# Patient Record
Sex: Female | Born: 1980 | Race: Black or African American | Hispanic: No | Marital: Single | Smoking: Never smoker
Health system: Southern US, Community
[De-identification: ages and names within clinical notes are randomized; demographics above are authoritative.]

## PROBLEM LIST (undated history)

## (undated) DIAGNOSIS — F32A Depression, unspecified: Secondary | ICD-10-CM

## (undated) DIAGNOSIS — F419 Anxiety disorder, unspecified: Secondary | ICD-10-CM

## (undated) DIAGNOSIS — F329 Major depressive disorder, single episode, unspecified: Secondary | ICD-10-CM

## (undated) DIAGNOSIS — I10 Essential (primary) hypertension: Secondary | ICD-10-CM

## (undated) DIAGNOSIS — T7840XA Allergy, unspecified, initial encounter: Secondary | ICD-10-CM

## (undated) HISTORY — DX: Major depressive disorder, single episode, unspecified: F32.9

## (undated) HISTORY — DX: Essential (primary) hypertension: I10

## (undated) HISTORY — DX: Depression, unspecified: F32.A

## (undated) HISTORY — DX: Anxiety disorder, unspecified: F41.9

## (undated) HISTORY — PX: CHOLECYSTECTOMY: SHX55

## (undated) HISTORY — DX: Allergy, unspecified, initial encounter: T78.40XA

---
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
## 2006-10-23 ENCOUNTER — Other Ambulatory Visit: Payer: Self-pay

## 2006-10-23 ENCOUNTER — Emergency Department: Payer: Self-pay | Admitting: Emergency Medicine

## 2006-10-23 IMAGING — CR DG CHEST 2V
1 series · 2 of 2 positions shown · non-contrast
Comparison: none

REASON FOR EXAM: Choking sensation in chest, slight wheezing
COMMENTS:

PROCEDURE:     DXR - DXR CHEST PA (OR AP) AND LATERAL  - [DATE]  [DATE]
RESULT:     The lungs are adequately inflated. There is no focal infiltrate.
The heart is not enlarged and the pulmonary vascularity is not engorged. The
mediastinum is normal in width.

[Series 1: view not recorded · 0.17mm/px · 2 of 2 slices shown]
[im 1/2]
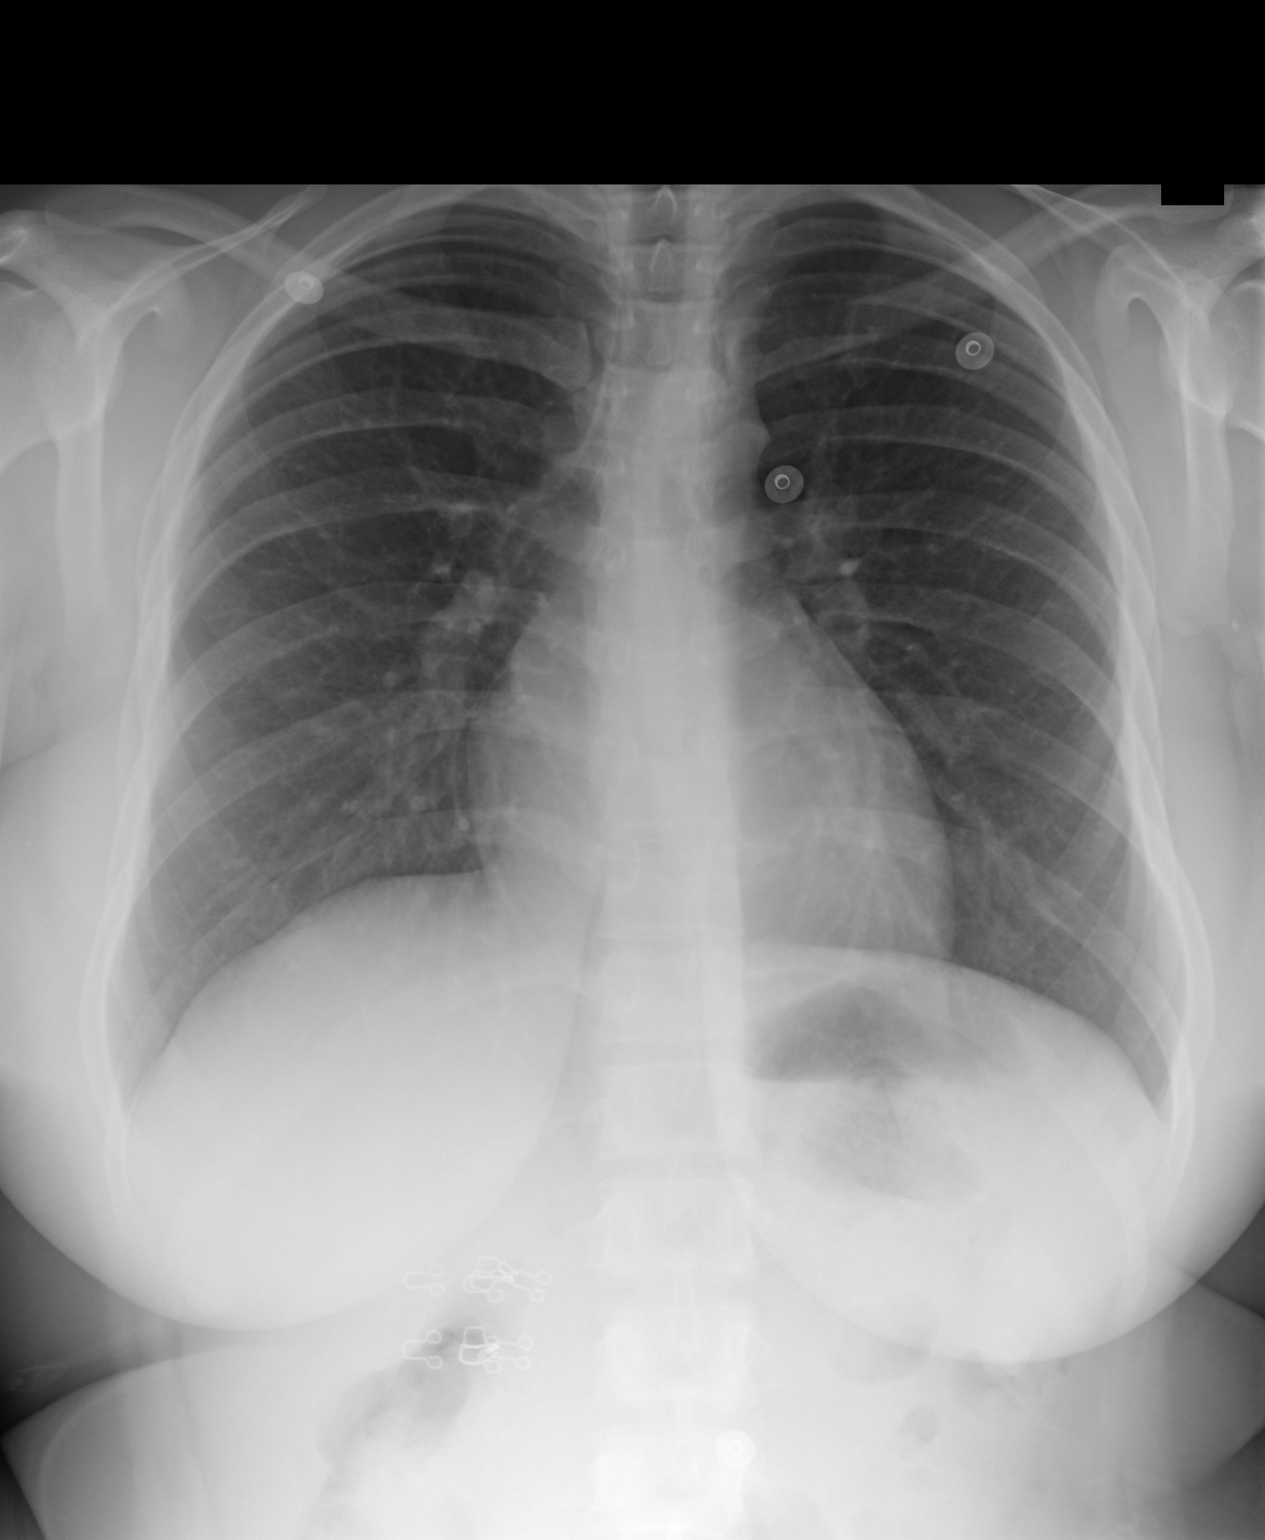
[im 2/2]
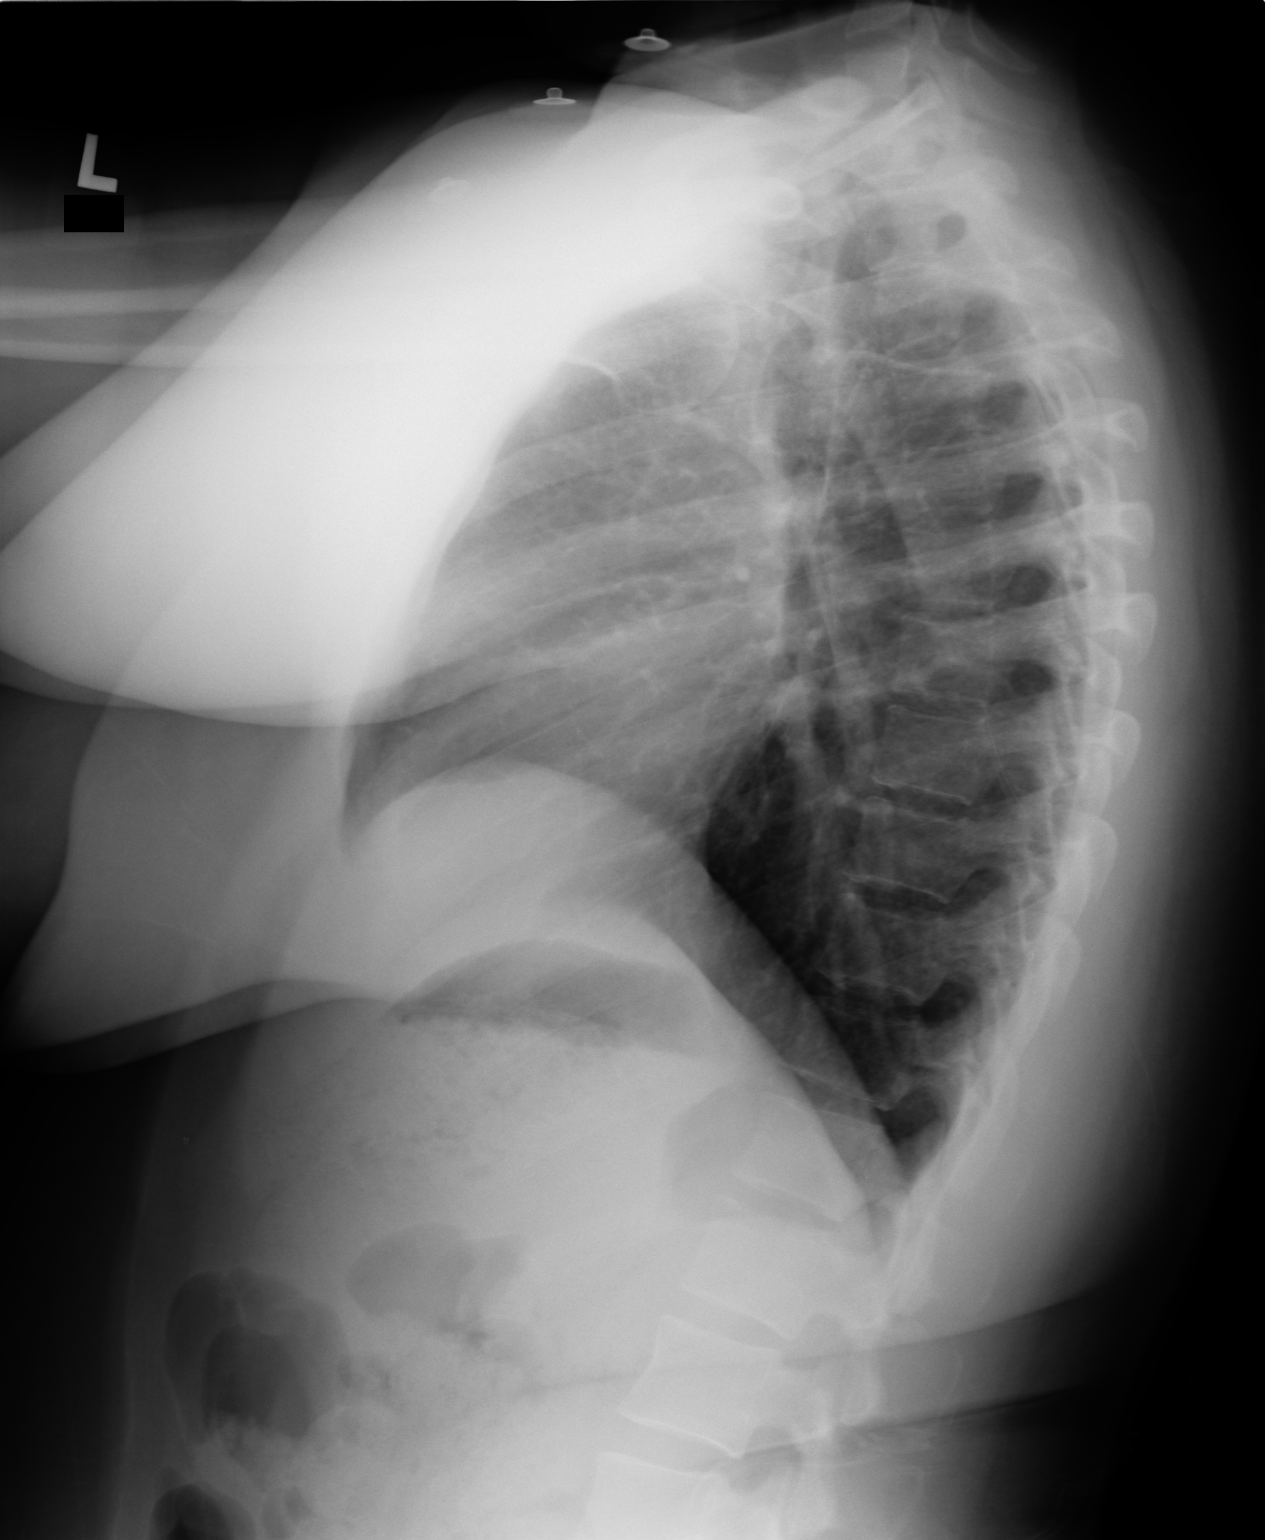

[2 of 2 positions shown; findings below may reference images not displayed]

IMPRESSION: I do not see evidence of acute cardiopulmonary disease.

## 2007-05-12 ENCOUNTER — Emergency Department: Payer: Self-pay | Admitting: Internal Medicine

## 2008-04-28 ENCOUNTER — Emergency Department: Payer: Self-pay | Admitting: Internal Medicine

## 2008-04-30 ENCOUNTER — Emergency Department: Payer: Self-pay | Admitting: Emergency Medicine

## 2008-05-01 IMAGING — CR DG CHEST 2V
1 series · 2 of 2 positions shown · non-contrast
Comparison: none

REASON FOR EXAM: cough / sorethroat   MC 2
COMMENTS:   LMP: Now

PROCEDURE:     DXR - DXR CHEST PA (OR AP) AND LATERAL  - [DATE] [DATE]
RESULT:     Comparison is made to study [DATE].
The lungs are well expanded. There is no focal infiltrate. The heart is
normal in normal in size. The pulmonary vascularity is not engorged. There
is no pleural effusion.

[Series 1: view not recorded · 0.17mm/px · 2 of 2 slices shown]
[im 1/2]
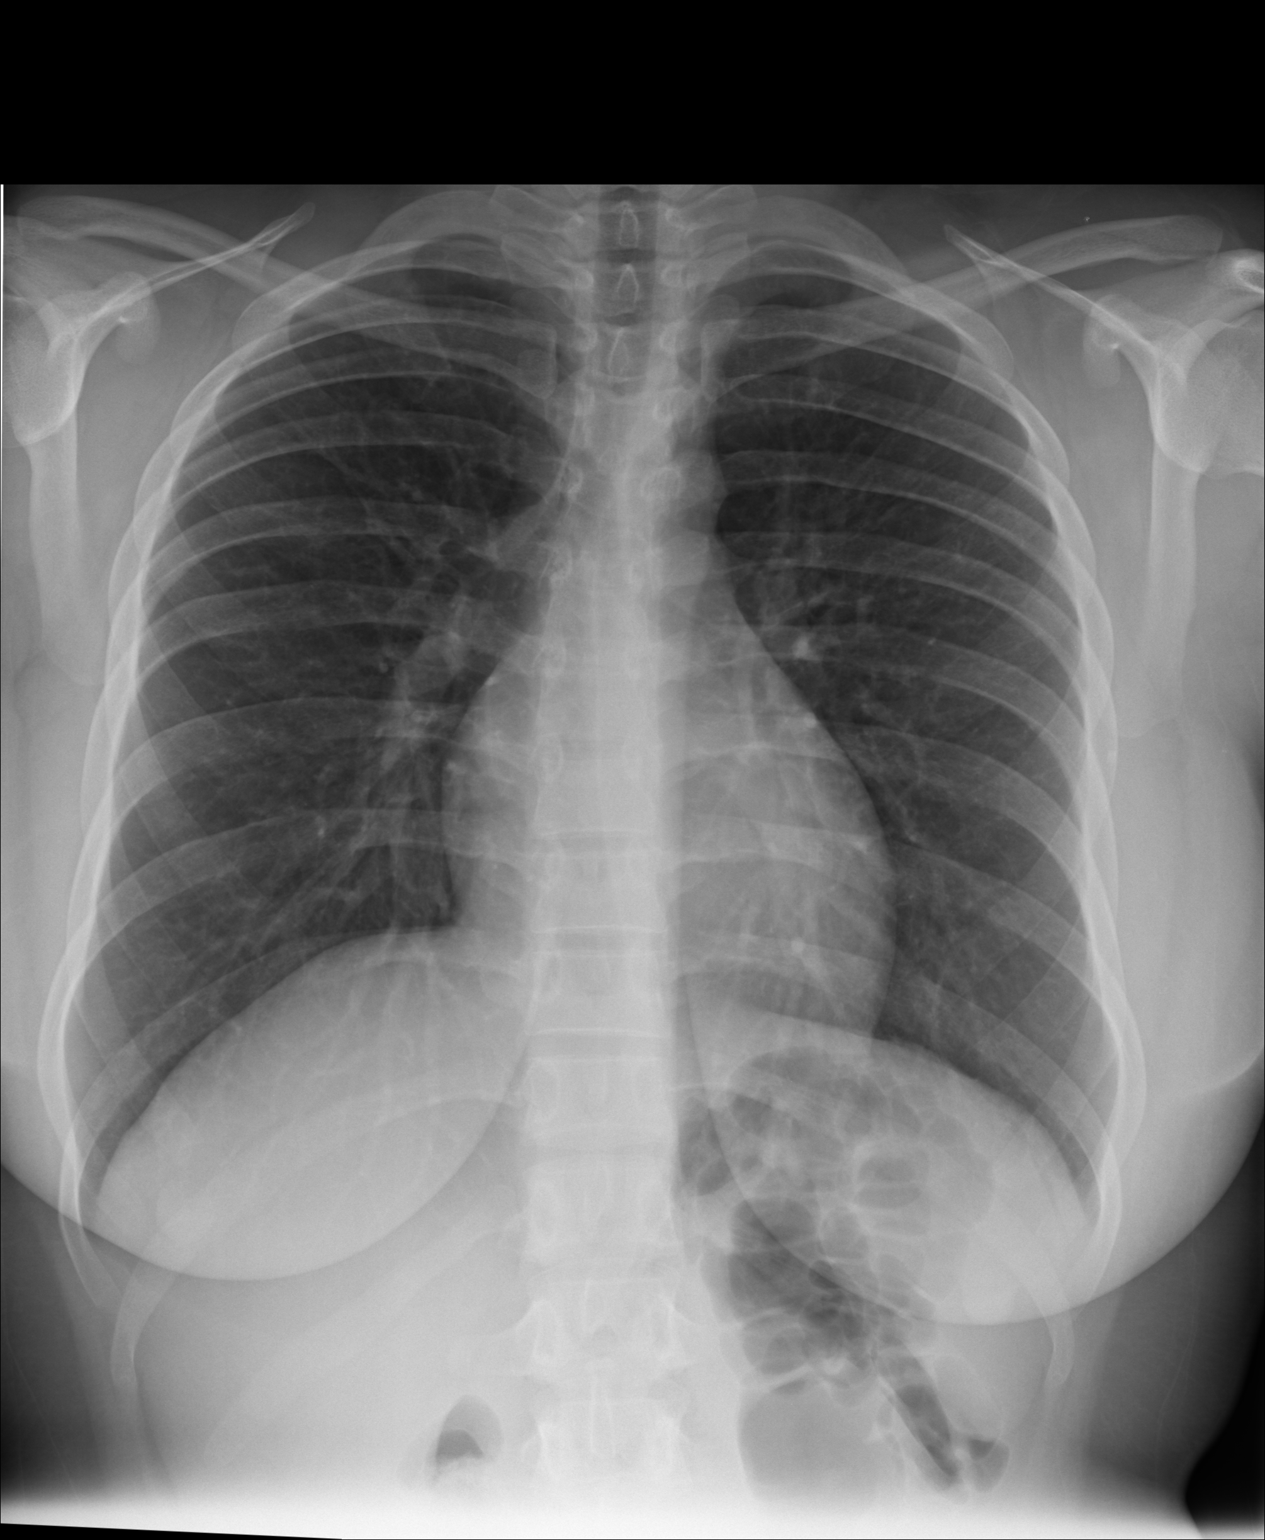
[im 2/2]
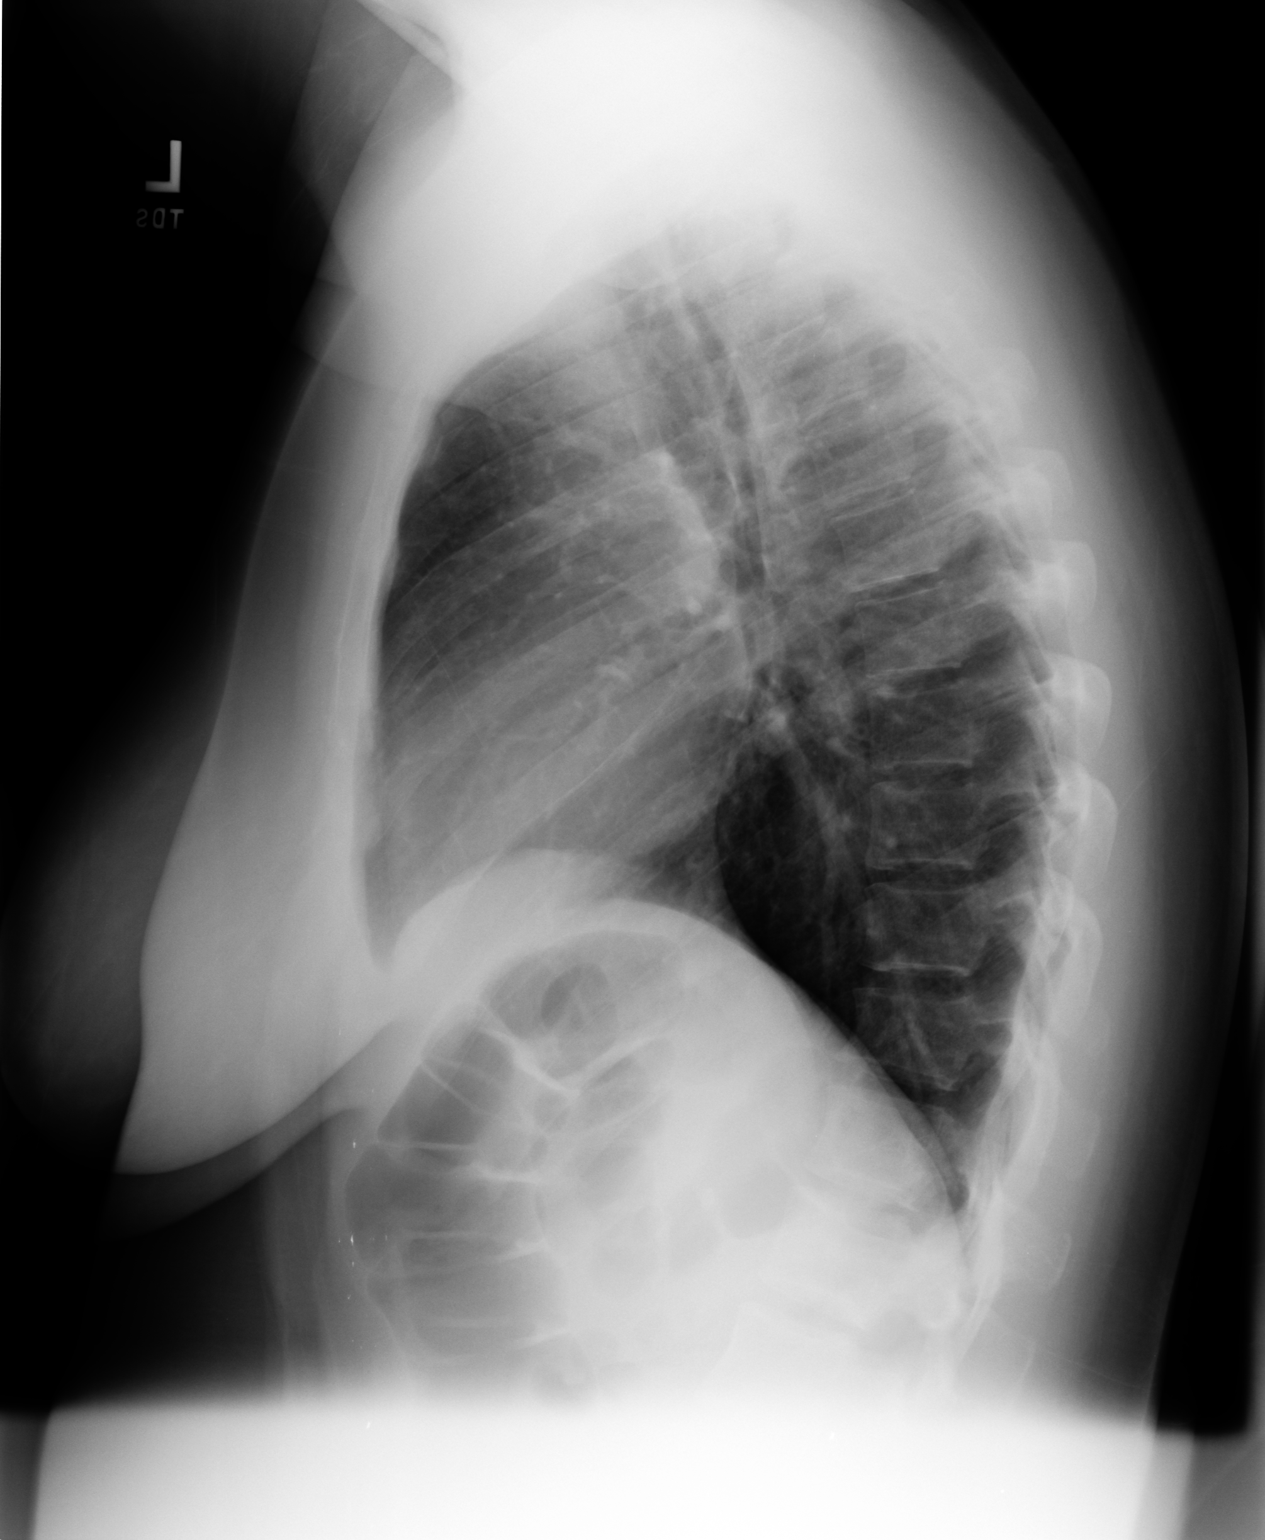

[2 of 2 positions shown; findings below may reference images not displayed]

IMPRESSION: I do not see evidence of acute cardiopulmonary abnormality.

## 2008-05-19 ENCOUNTER — Emergency Department: Payer: Self-pay | Admitting: Emergency Medicine

## 2009-02-13 ENCOUNTER — Emergency Department: Payer: Self-pay | Admitting: Emergency Medicine

## 2009-02-13 IMAGING — CT CT CERVICAL SPINE WITHOUT CONTRAST
1 series · 12 of 14 positions shown, 15 images · non-contrast
Comparison: none

REASON FOR EXAM: neck pain
COMMENTS:

[Series 6: axial · axial · 0.33mm/px · z∈[+812,+939]mm · 12 of 78 slices shown, 15 images]
[im 6/78  soft-tissue]
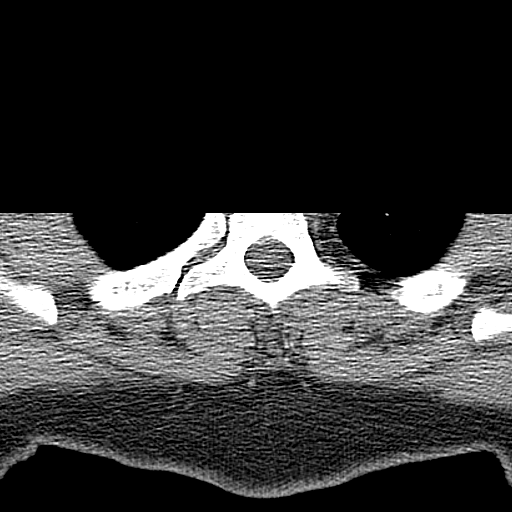
[im 6/78  bone]
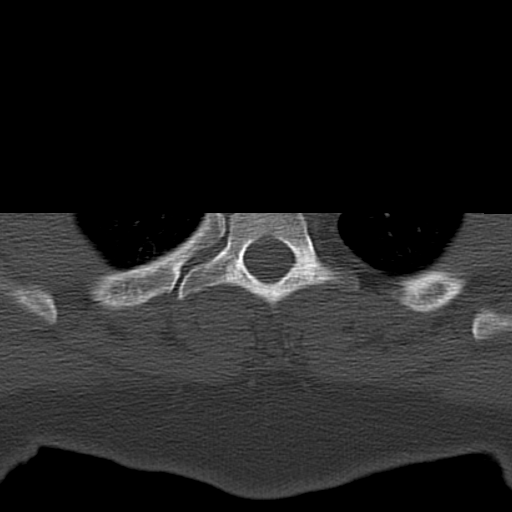
[im 12/78  bone]
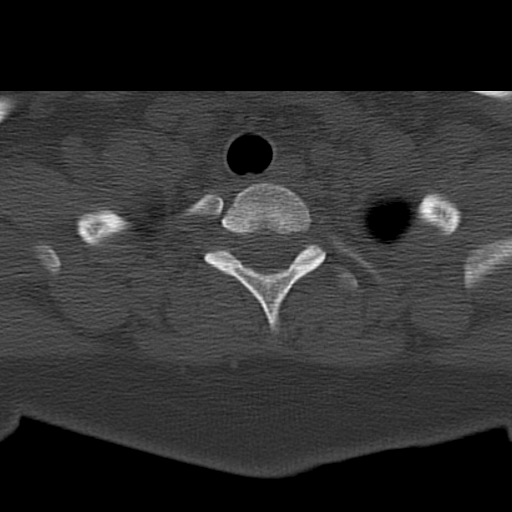
[im 18/78  bone]
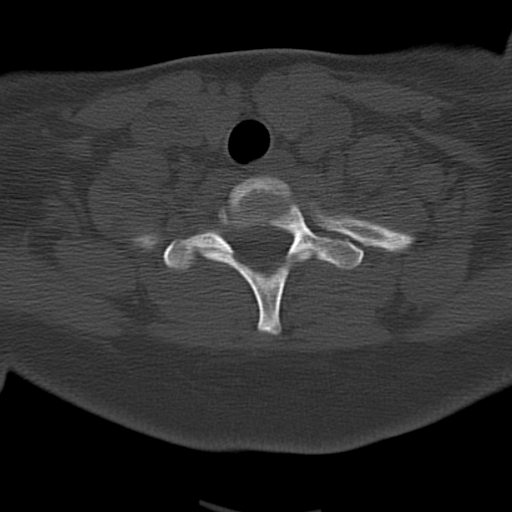
[im 24/78  bone]
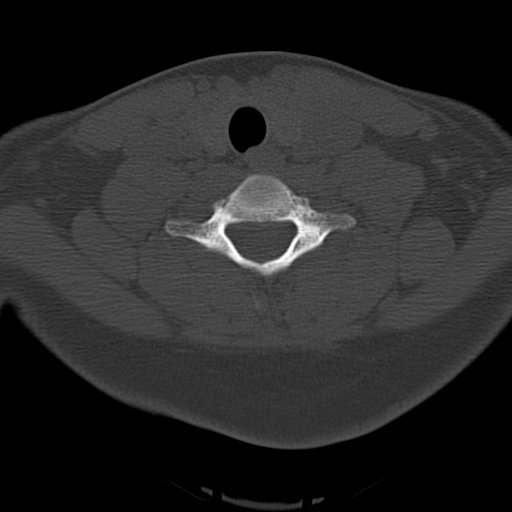
[im 30/78  soft-tissue]
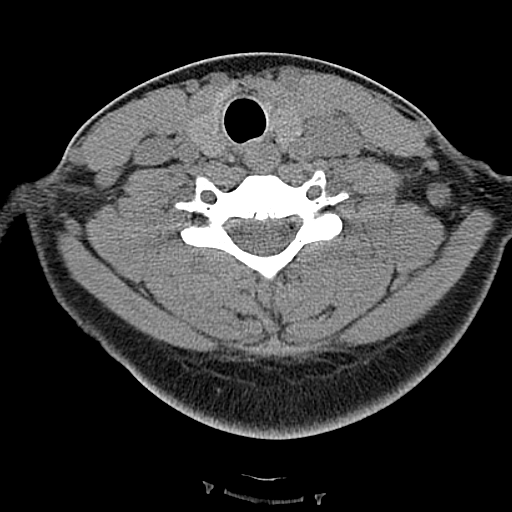
[im 30/78  bone]
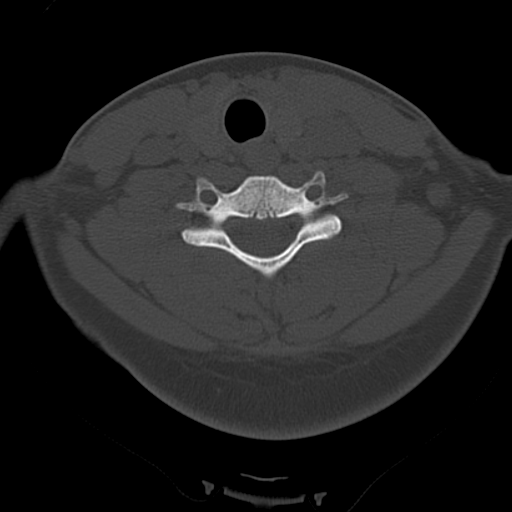
[im 36/78  bone]
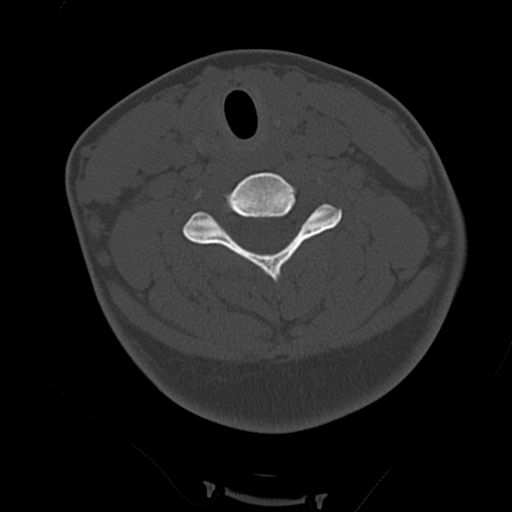
[im 42/78  bone]
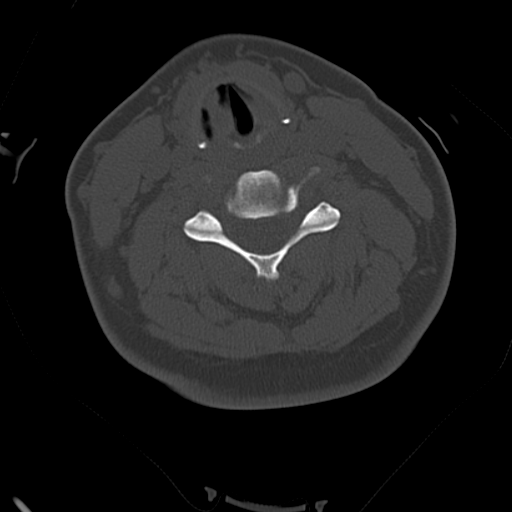
[im 48/78  bone]
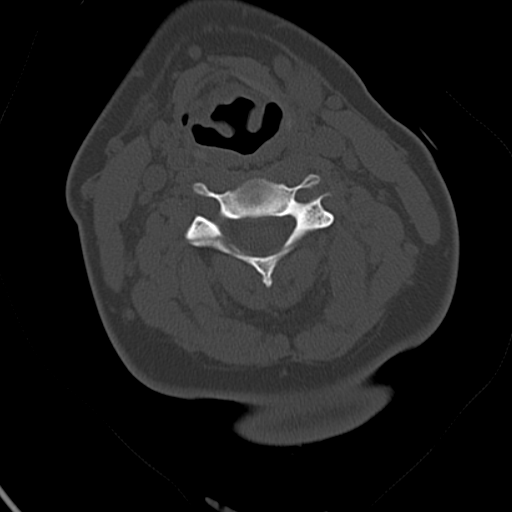
[im 54/78  soft-tissue]
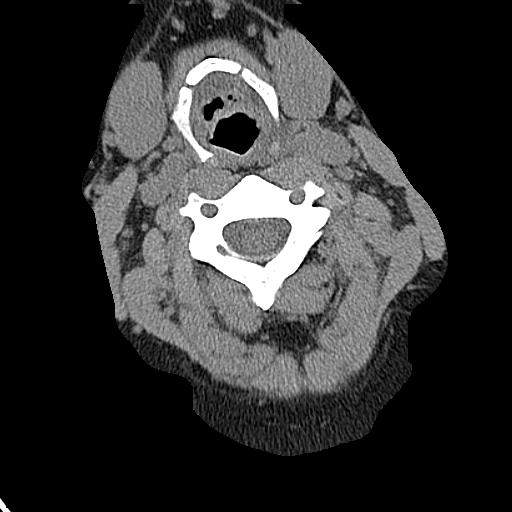
[im 54/78  bone]
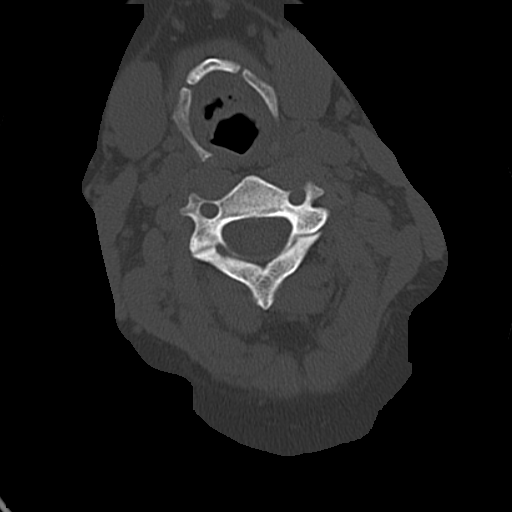
[im 60/78  bone]
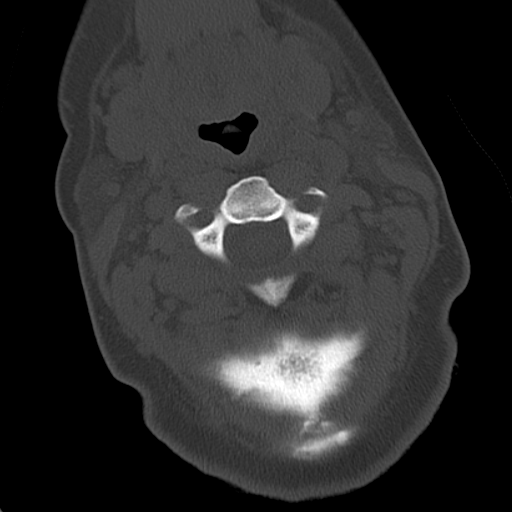
[im 66/78  bone]
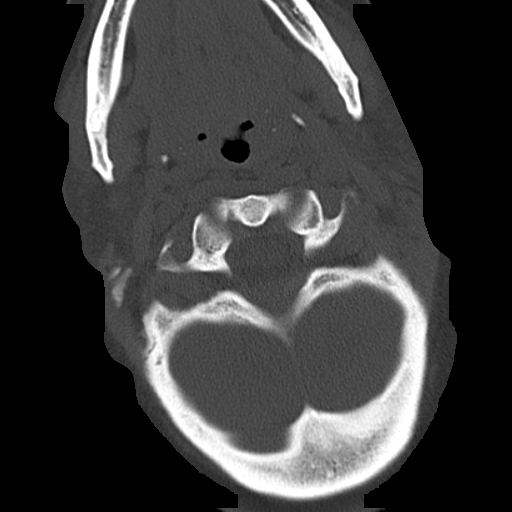
[im 72/78  bone]
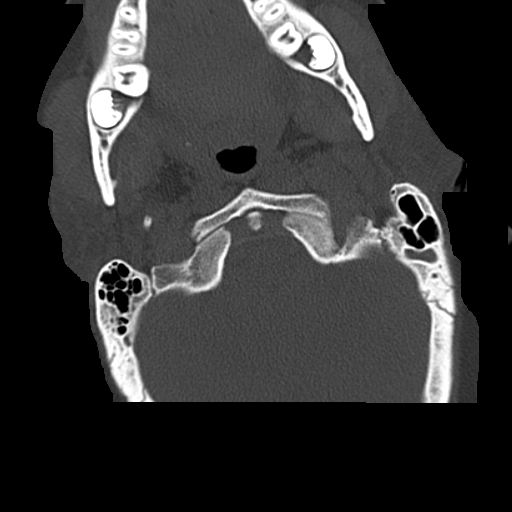

[12 of 14 positions shown; findings below may reference images not displayed]

PROCEDURE:     CT  - CT CERVICAL SPINE WO  - [DATE]  [DATE]

RESULT:     Sagittal, axial, and coronal images were obtained through the
cervical spine area

The cervical vertebral bodies are preserved in height. The intervertebral
disc space heights are well maintained. The prevertebral soft tissue planes
appear normal. Fullness in the prevertebral soft tissues posterior to the
airway is noted at the C2 and C3 levels but this is likely physiologic.
Review of the images at soft tissue window settings do not reveal disruption
of the fat planes and no soft tissue gas or fluid is identified here.

 The odontoid is intact. The lateral masses of C1 align normally with those
of C2 on the coronal images. On the axial images the patient's head is noted
to be slightly turned toward the right with respect to the axis of C1 and
C2. The bony ring at each cervical level is intact.
IMPRESSION: I do not see evidence of an acute cervical spine fracture.
There may be muscle spasm present. Please see the discussion above.

## 2009-09-18 ENCOUNTER — Ambulatory Visit: Payer: Self-pay | Admitting: Adult Health

## 2009-09-18 IMAGING — US US PELV - US TRANSVAGINAL
1 series · 17 of 25 positions shown · non-contrast
Comparison: none

REASON FOR EXAM: HEAVY MENSTRUAL CYCLES
COMMENTS:

[Series 1: us pelv - us transvaginal · 17 of 58 slices shown]
[im 1/58]
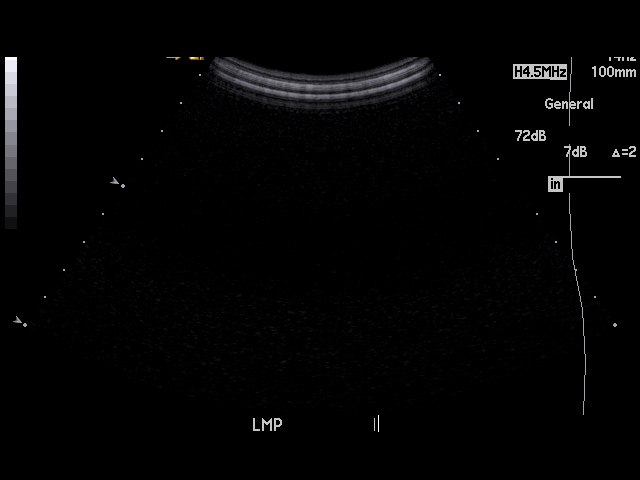
[im 5/58]
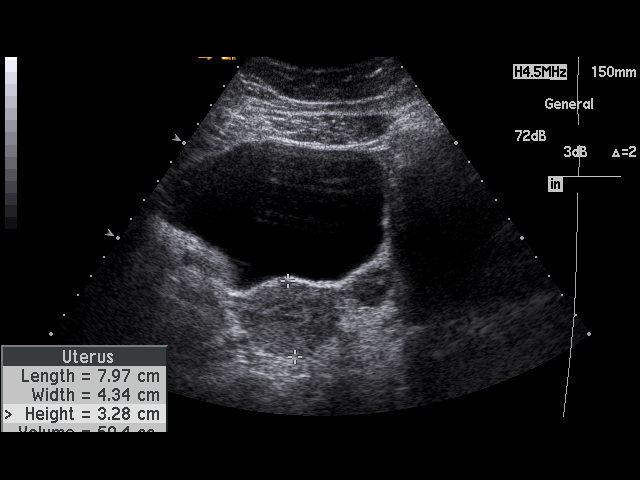
[im 8/58]
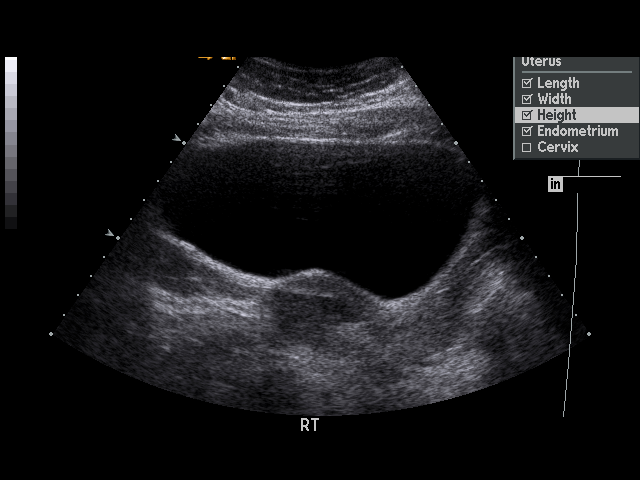
[im 12/58]
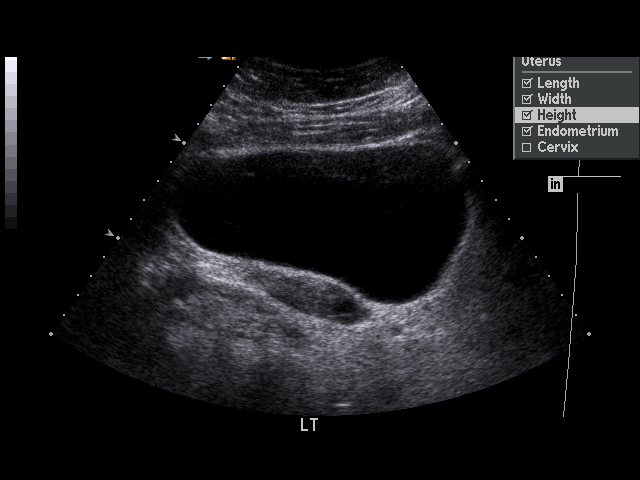
[im 15/58]
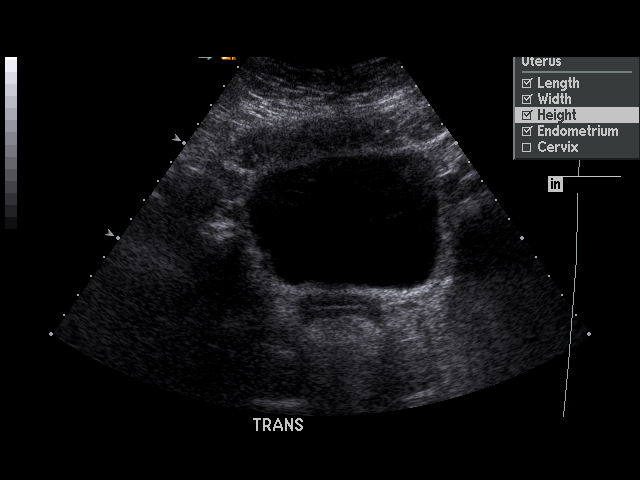
[im 20/58]
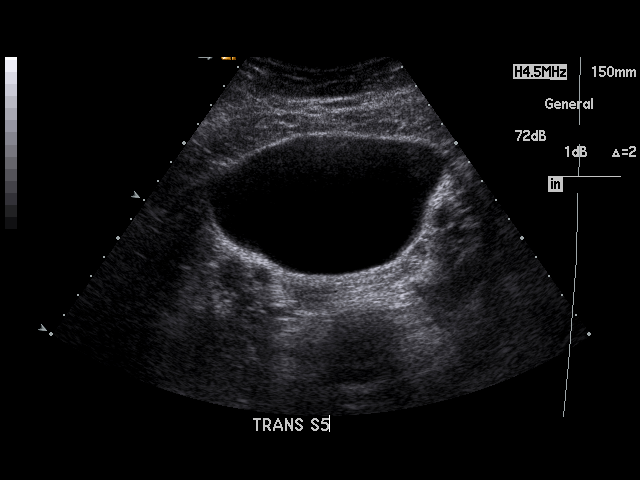
[im 22/58]
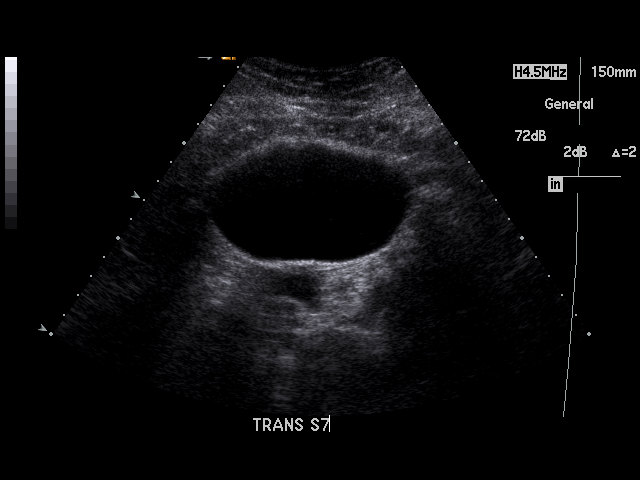
[im 27/58]
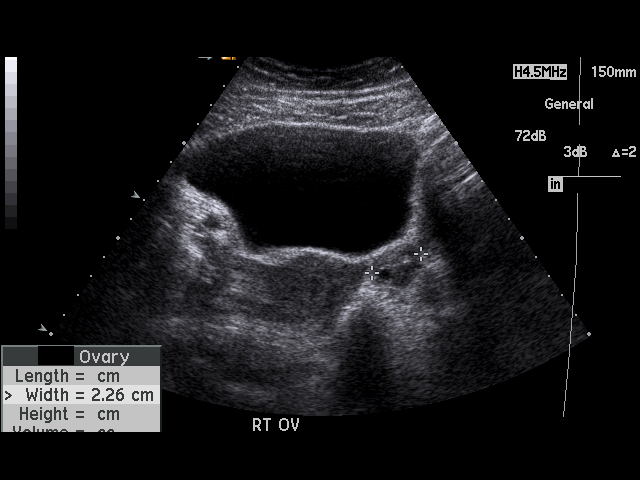
[im 29/58]
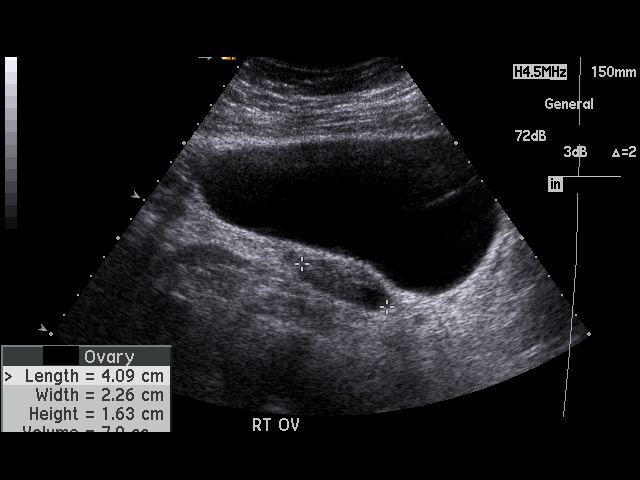
[im 31/58]
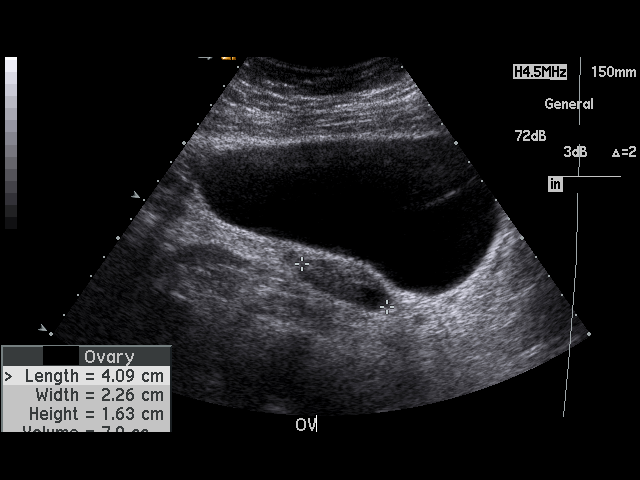
[im 36/58]
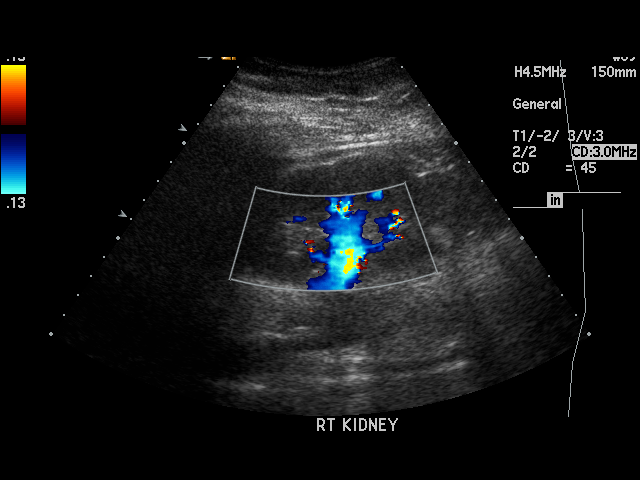
[im 39/58]
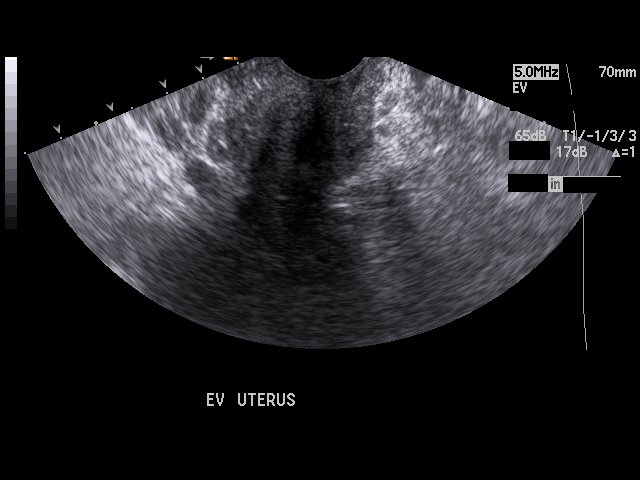
[im 43/58]
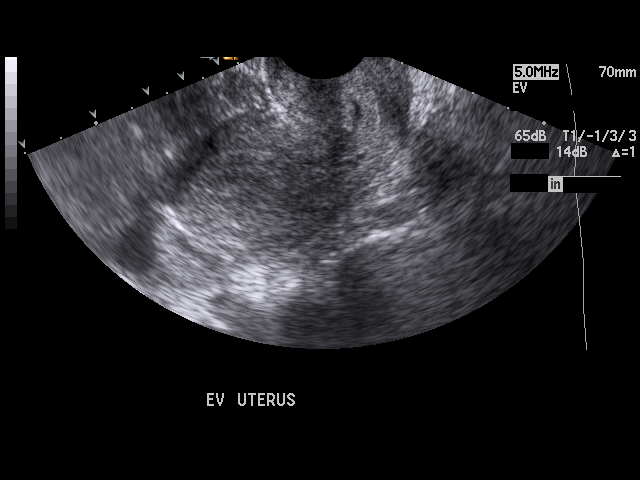
[im 46/58]
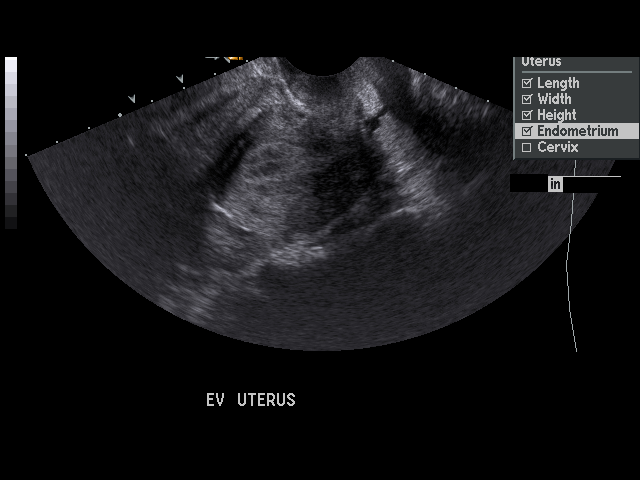
[im 50/58]
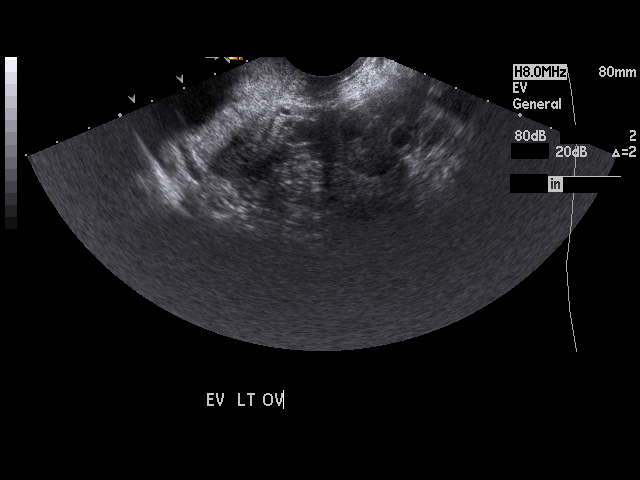
[im 53/58]
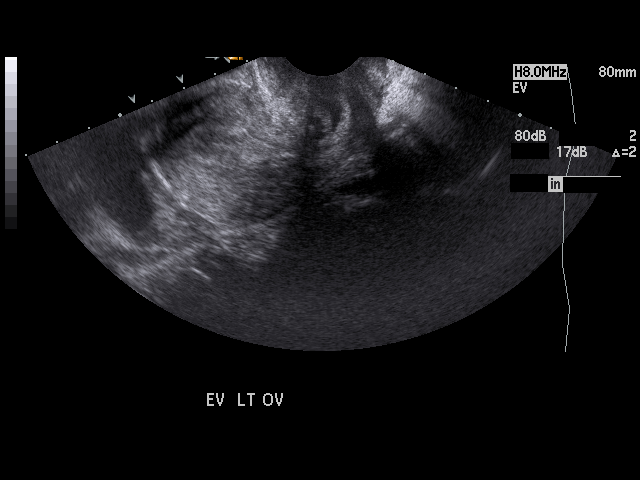
[im 58/58]
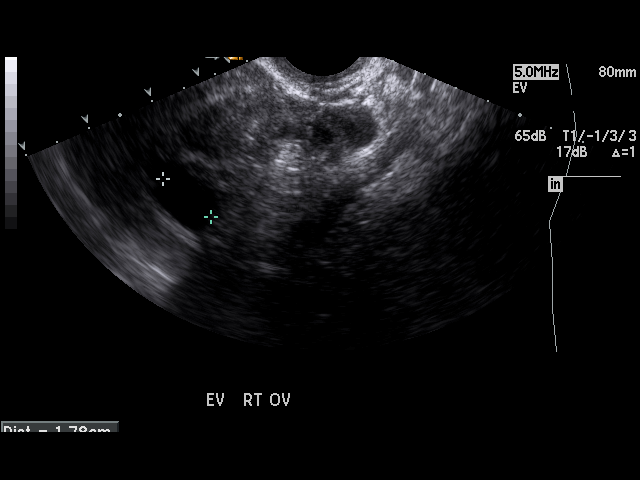

[17 of 25 positions shown; findings below may reference images not displayed]

PROCEDURE:     US  - US PELVIS EXAM W/TRANSVAGINAL  - [DATE]  [DATE]

RESULT:

Transabdominal and endovaginal imaging of the pelvis was obtained.

The uterus measures 7.9 x 4.3 x 3.2 centimeters. Endometrial thickness is
1.1 cm. The uterus demonstrates a homogeneous echotexture. The right ovary
measures 3 x 2.4 x 1.6 cm and the left 4 x 2.2 x 1.6 cm. A right ovarian
cyst is identified measuring 1.4 x 1.34 cm. There is no evidence of pelvic
masses, free fluid nor loculated fluid collections.
IMPRESSION: Right ovarian cyst otherwise unremarkable pelvic ultrasound.

## 2010-02-18 ENCOUNTER — Emergency Department: Payer: Self-pay | Admitting: Emergency Medicine

## 2012-02-03 ENCOUNTER — Emergency Department: Payer: Self-pay | Admitting: *Deleted

## 2012-02-03 LAB — URINALYSIS, COMPLETE
Bilirubin,UR: NEGATIVE
Glucose,UR: NEGATIVE mg/dL (ref 0–75)
Ketone: NEGATIVE
Nitrite: NEGATIVE
Ph: 5 (ref 4.5–8.0)
Protein: 30
RBC,UR: 276 /HPF (ref 0–5)
Specific Gravity: 1.026 (ref 1.003–1.030)
Squamous Epithelial: 20
WBC UR: NONE SEEN /HPF (ref 0–5)

## 2012-02-03 LAB — PREGNANCY, URINE: Pregnancy Test, Urine: NEGATIVE m[IU]/mL

## 2012-02-03 IMAGING — CT CT STONE STUDY
1 of 2 series · 16 of 32 positions shown, 20 images · non-contrast
Comparison: none

REASON FOR EXAM: right flank pain with gross hematuria
COMMENTS:   LMP: Two weeks ago

PROCEDURE:     CT  - CT ABDOMEN /PELVIS WO (STONE)  - [DATE] [DATE]
RESULT:     History: Right flank pain.
Comparison Study: No recent.

[Series 2: 3mm soft tissue · axial · 0.76mm/px · z∈[-502,-88]mm · 16 of 152 slices shown, 20 images]
[im 7/152  soft-tissue]
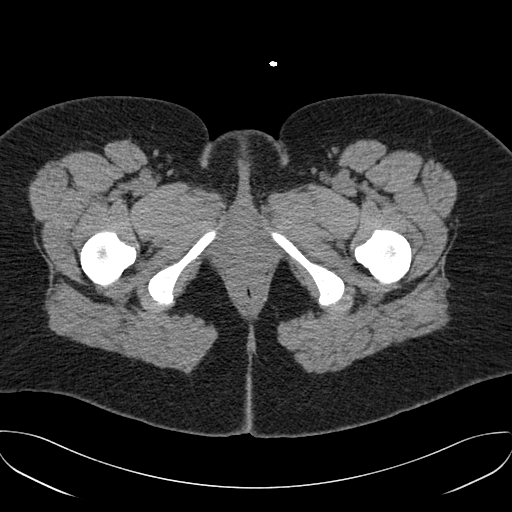
[im 7/152  bone]
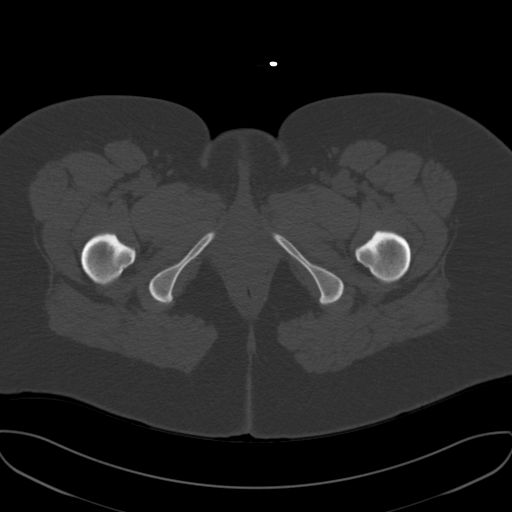
[im 19/152  soft-tissue]
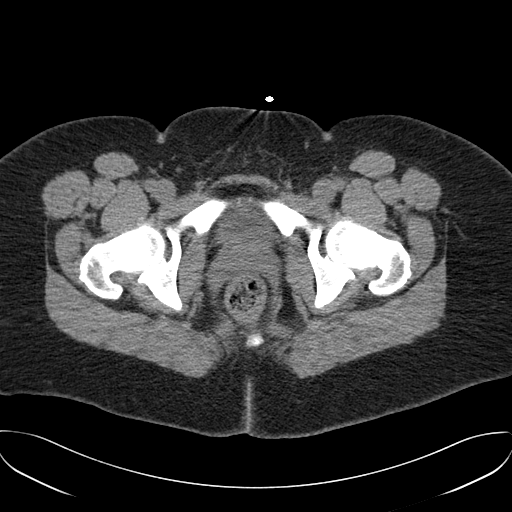
[im 32/152  soft-tissue]
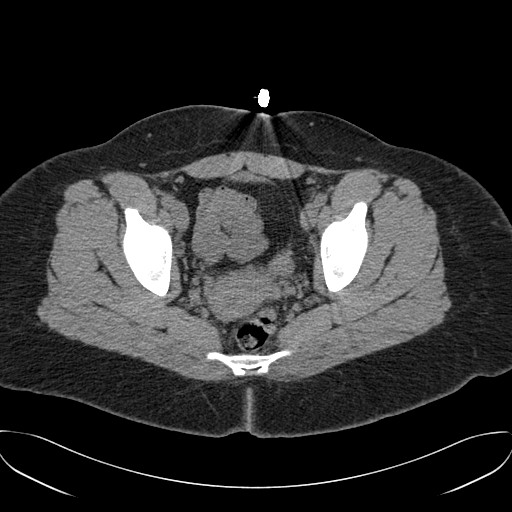
[im 38/152  soft-tissue]
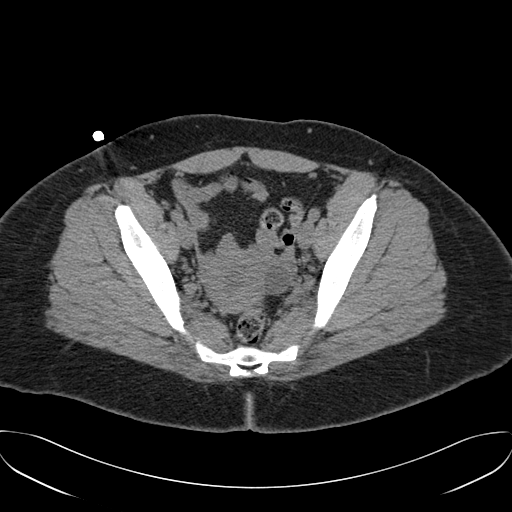
[im 51/152  soft-tissue]
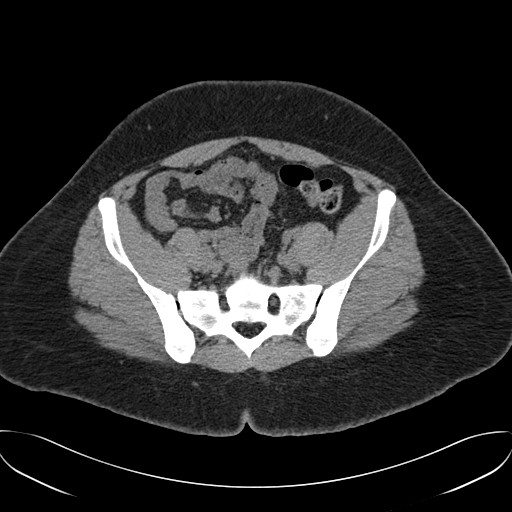
[im 63/152  soft-tissue]
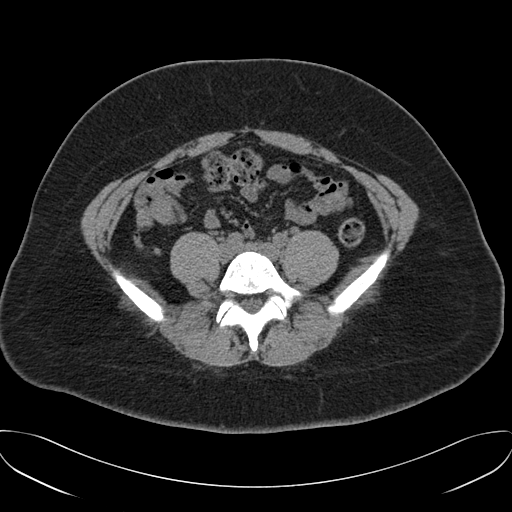
[im 70/152  soft-tissue]
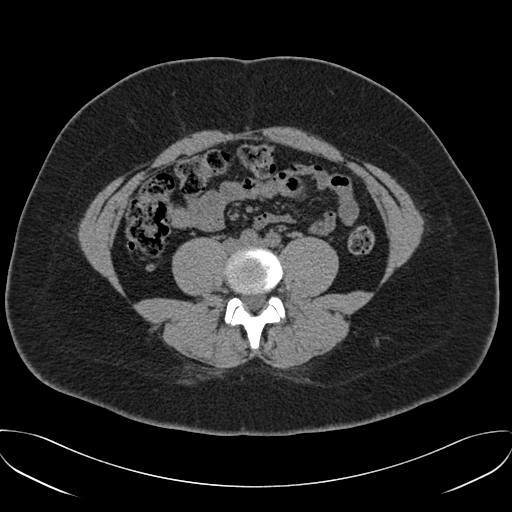
[im 82/152  soft-tissue]
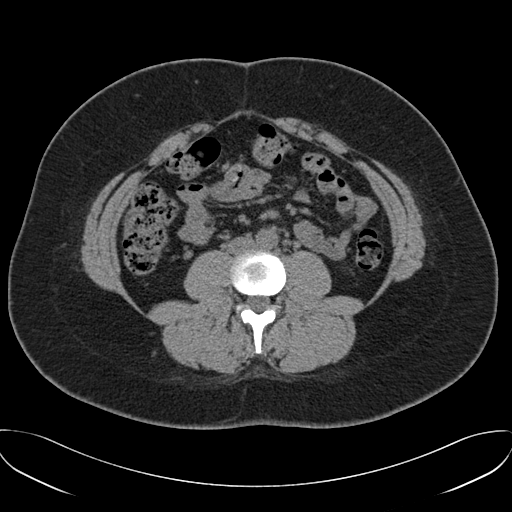
[im 89/152  soft-tissue]
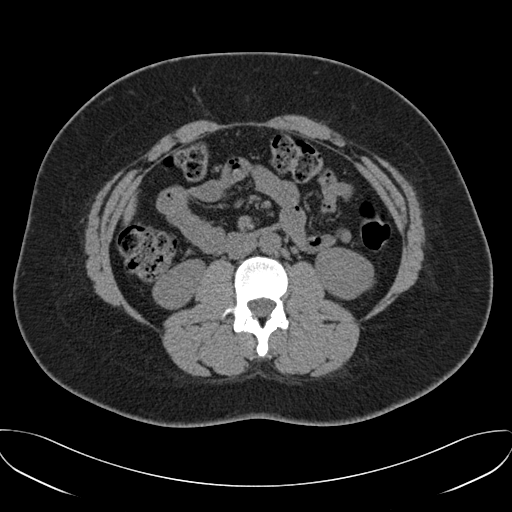
[im 89/152  bone]
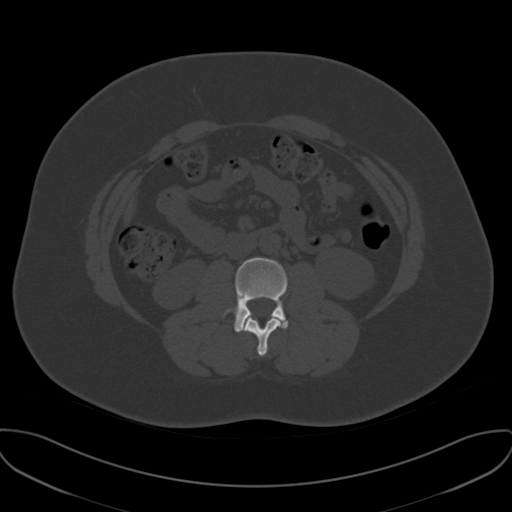
[im 101/152  soft-tissue]
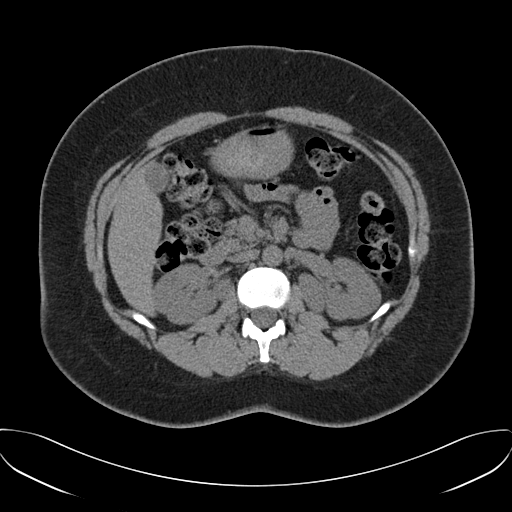
[im 114/152  soft-tissue]
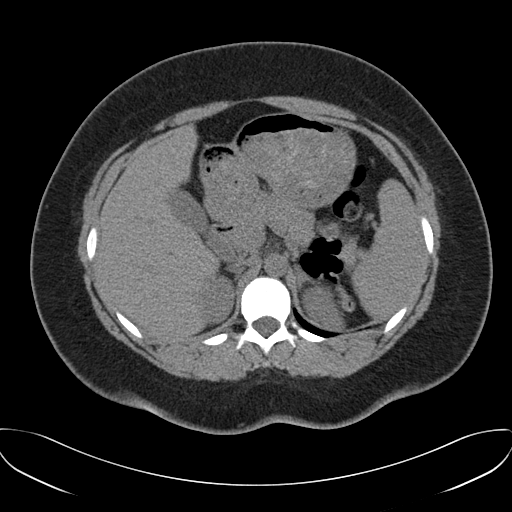
[im 120/152  soft-tissue]
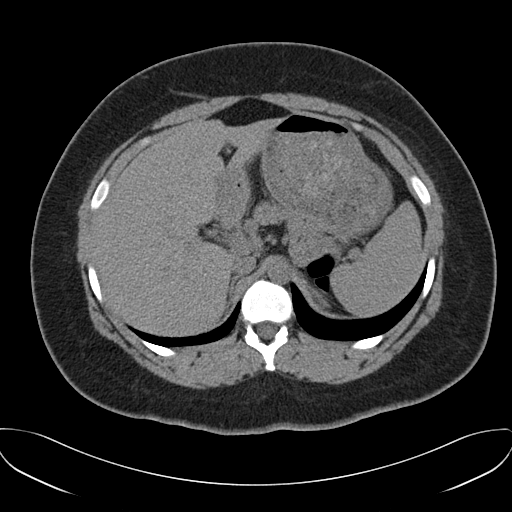
[im 126/152  lung]
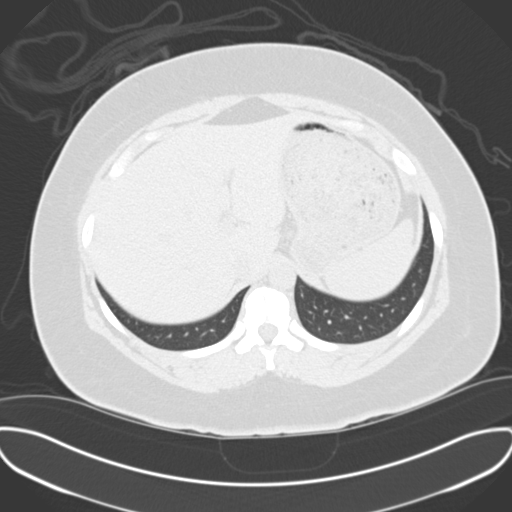
[im 133/152  soft-tissue]
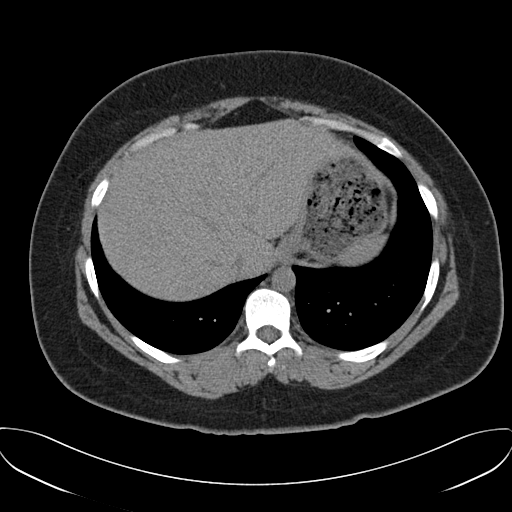
[im 133/152  lung]
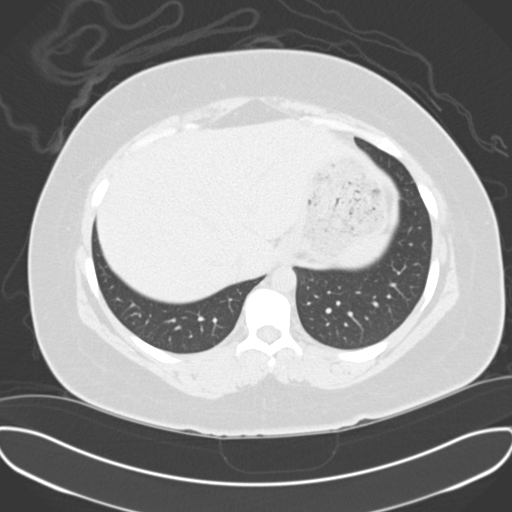
[im 139/152  lung]
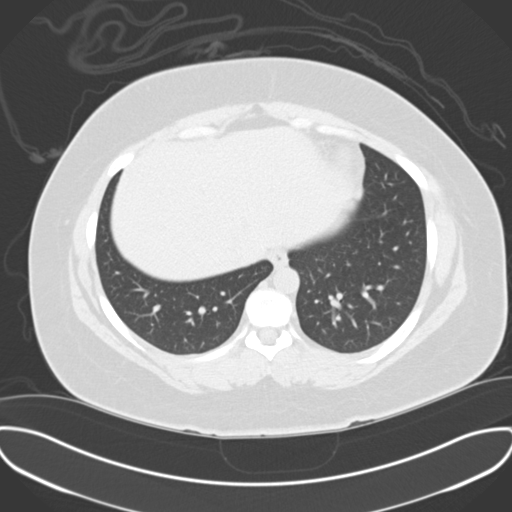
[im 145/152  soft-tissue]
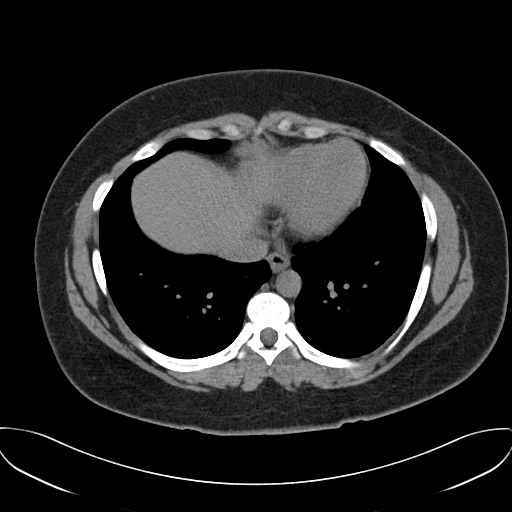
[im 145/152  lung]
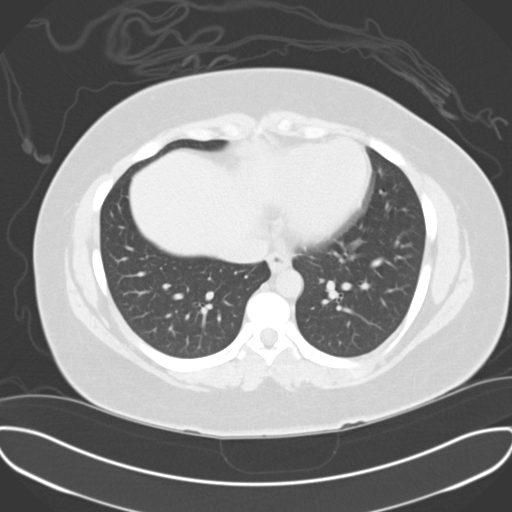

[16 of 32 positions shown; findings below may reference images not displayed]

FINDINGS: Standard nonenhanced CT obtained. Liver normal. Spleen normal.
Pancreas normal. Adrenals normal. Kidneys normal. No hydronephrosis, left
ovarian small cyst. Phleboliths. No bowel distention. Appendix normal. Small
umbilical hernia. No free air. Lung bases clear.
IMPRESSION: No acute abnormality.

## 2012-05-17 ENCOUNTER — Emergency Department: Payer: Self-pay | Admitting: Emergency Medicine

## 2012-05-17 LAB — RAPID INFLUENZA A&B ANTIGENS

## 2012-05-19 LAB — BETA STREP CULTURE(ARMC)

## 2013-02-27 ENCOUNTER — Other Ambulatory Visit: Payer: Self-pay | Admitting: Family Medicine

## 2013-02-27 LAB — COMPREHENSIVE METABOLIC PANEL
Albumin: 4.1 g/dL (ref 3.4–5.0)
Alkaline Phosphatase: 112 U/L (ref 50–136)
Anion Gap: 6 — ABNORMAL LOW (ref 7–16)
BUN: 12 mg/dL (ref 7–18)
Bilirubin,Total: 0.5 mg/dL (ref 0.2–1.0)
Calcium, Total: 9.5 mg/dL (ref 8.5–10.1)
Chloride: 102 mmol/L (ref 98–107)
Co2: 29 mmol/L (ref 21–32)
Creatinine: 0.95 mg/dL (ref 0.60–1.30)
EGFR (African American): >60
EGFR (Non-African Amer.): >60
Glucose: 97 mg/dL (ref 65–99)
Osmolality: 273 (ref 275–301)
Potassium: 3.9 mmol/L (ref 3.5–5.1)
SGOT(AST): 85 U/L — ABNORMAL HIGH (ref 15–37)
SGPT (ALT): 37 U/L (ref 12–78)
Sodium: 137 mmol/L (ref 136–145)
Total Protein: 8.5 g/dL — ABNORMAL HIGH (ref 6.4–8.2)

## 2013-02-27 LAB — CBC WITH DIFFERENTIAL/PLATELET
Basophil #: 0.1 10*3/uL (ref 0.0–0.1)
Basophil %: 0.5 %
Eosinophil #: 0.1 10*3/uL (ref 0.0–0.7)
Eosinophil %: 0.6 %
HCT: 40.5 % (ref 35.0–47.0)
HGB: 13.2 g/dL (ref 12.0–16.0)
Lymphocyte #: 3.1 10*3/uL (ref 1.0–3.6)
Lymphocyte %: 24 %
MCH: 21.2 pg — ABNORMAL LOW (ref 26.0–34.0)
MCHC: 32.5 g/dL (ref 32.0–36.0)
MCV: 65 fL — ABNORMAL LOW (ref 80–100)
Monocyte #: 0.8 x10 3/mm (ref 0.2–0.9)
Monocyte %: 6.3 %
Neutrophil #: 8.7 10*3/uL — ABNORMAL HIGH (ref 1.4–6.5)
Neutrophil %: 68.6 %
Platelet: 288 10*3/uL (ref 150–440)
RBC: 6.22 10*6/uL — ABNORMAL HIGH (ref 3.80–5.20)
RDW: 16.3 % — ABNORMAL HIGH (ref 11.5–14.5)
WBC: 12.8 10*3/uL — ABNORMAL HIGH (ref 3.6–11.0)

## 2013-02-27 LAB — HEMOGLOBIN A1C: Hemoglobin A1C: 6.1 % (ref 4.2–6.3)

## 2013-02-27 LAB — LIPID PANEL
Cholesterol: 208 mg/dL — ABNORMAL HIGH (ref 0–200)
HDL Cholesterol: 54 mg/dL (ref 40–60)
Ldl Cholesterol, Calc: 134 mg/dL — ABNORMAL HIGH (ref 0–100)
Triglycerides: 99 mg/dL (ref 0–200)
VLDL Cholesterol, Calc: 20 mg/dL (ref 5–40)

## 2016-09-08 ENCOUNTER — Telehealth: Payer: Self-pay | Admitting: Nurse Practitioner

## 2016-09-08 NOTE — Telephone Encounter (Signed)
Pt received call 2 weeks ago about needing more info for her application and wanted to know what info she needed

## 2016-11-17 ENCOUNTER — Ambulatory Visit: Payer: Self-pay | Admitting: Adult Health Nurse Practitioner

## 2016-11-17 ENCOUNTER — Encounter: Payer: Self-pay | Admitting: Adult Health Nurse Practitioner

## 2016-11-17 DIAGNOSIS — Z0000 Encounter for general adult medical examination without abnormal findings: Secondary | ICD-10-CM | POA: Insufficient documentation

## 2016-11-17 DIAGNOSIS — I10 Essential (primary) hypertension: Secondary | ICD-10-CM | POA: Insufficient documentation

## 2016-11-17 DIAGNOSIS — R300 Dysuria: Secondary | ICD-10-CM | POA: Insufficient documentation

## 2016-11-17 NOTE — Progress Notes (Signed)
  Patient: Angie BabaCassandra T Hafen Female    DOB: 03-07-1981   36 y.o.   MRN: [id number] Visit Date: 11/17/2016  Today's Provider: Jacelyn Pieah Doles-Johnson, NP   No chief complaint on file.  Subjective:    HPI   Pt states that she has been having problems with urination.  States she is having frequent urination some dysuria at night and some ? vaginal discharge. Denies hematuria. Currently having menses. Reports back pain and abdominal pain.  Not currently sexually active for the past two years.   Pt states that she has a lot of heartburn.  She take MOM and TUMs for relief.  States that she has been having some ringing in her ears.   Pt with a family hx of diabetes.   Pt states that she has bad anxiety and she goes to [facility] for management.  She states that she has trouble with transportation. She has not been since May.  She used to be on medications (doesnt' remember the name) off x 4 years.  States that she lost her job due to escalation of a situation.  Denies SI/HI.   Allergies not on file Previous Medications   No medications on file    Review of Systems  All other systems reviewed and are negative.   Social History  Substance Use Topics  . Smoking status: Never Smoker  . Smokeless tobacco: Not on file  . Alcohol use Not on file   Objective:   BP (!) 149/89   Pulse 78   Temp 98.4 F (36.9 C)   Physical Exam  Constitutional: She is oriented to person, place, and time. She appears well-developed and well-nourished.  HENT:  Head: Normocephalic and atraumatic.  Eyes: Pupils are equal, round, and reactive to light.  Neck: Normal range of motion. Neck supple.  Cardiovascular: Normal rate, regular rhythm, normal heart sounds and intact distal pulses.   Pulmonary/Chest: Effort normal and breath sounds normal.  Abdominal: Soft. Bowel sounds are normal. There is tenderness in the suprapubic area. There is no rebound, no guarding and no CVA tenderness.   Musculoskeletal: Normal range of motion.  Neurological: She is alert and oriented to person, place, and time.  Skin: Skin is warm and dry.  Vitals reviewed.       Assessment & Plan:        GERD:  Start Zantac daily for better control of symptoms.  MOM and TUMS PRN.   Dysuria:  UA C&S obtained. Will order treatment once obtained.  Routine labs ordered- will need to come on Thursday for labs.   Elevated BP:  Encourage low sodium diet and exercise.  FU in 2 weeks for BP check and lab review.   Anxiety:  Encourage non-pharmalogical interventions.  Discussed referral appt with Phil.    Jacelyn Pieah Doles-Johnson, NP   [facility] [place]

## 2016-11-19 ENCOUNTER — Other Ambulatory Visit: Payer: Self-pay

## 2016-11-19 NOTE — Addendum Note (Signed)
Addended by: Dustin FlockMAYNOR, Shaneen Reeser D on: 11/19/2016 07:32 PM   Modules accepted: Orders

## 2016-11-19 NOTE — Addendum Note (Signed)
Addended by: Dustin FlockMAYNOR, Glena Pharris D on: 11/19/2016 07:30 PM   Modules accepted: Orders

## 2016-11-20 LAB — LIPID PANEL
Chol/HDL Ratio: 3.8 ratio (ref 0.0–4.4)
Cholesterol, Total: 182 mg/dL (ref 100–199)
HDL: 48 mg/dL (ref >39)
LDL Calculated: 115 mg/dL — ABNORMAL HIGH (ref 0–99)
Triglycerides: 96 mg/dL (ref 0–149)
VLDL Cholesterol Cal: 19 mg/dL (ref 5–40)

## 2016-11-20 LAB — CBC
Hematocrit: 32.7 % — ABNORMAL LOW (ref 34.0–46.6)
Hemoglobin: 10.4 g/dL — ABNORMAL LOW (ref 11.1–15.9)
MCH: 20.8 pg — ABNORMAL LOW (ref 26.6–33.0)
MCHC: 31.8 g/dL (ref 31.5–35.7)
MCV: 65 fL — ABNORMAL LOW (ref 79–97)
Platelets: 328 10*3/uL (ref 150–379)
RBC: 5.01 x10E6/uL (ref 3.77–5.28)
RDW: 17 % — ABNORMAL HIGH (ref 12.3–15.4)
WBC: 12.8 10*3/uL — ABNORMAL HIGH (ref 3.4–10.8)

## 2016-11-20 LAB — COMPREHENSIVE METABOLIC PANEL
ALT: 11 IU/L (ref 0–32)
AST: 16 IU/L (ref 0–40)
Albumin/Globulin Ratio: 1.8 (ref 1.2–2.2)
Albumin: 4.3 g/dL (ref 3.5–5.5)
Alkaline Phosphatase: 96 IU/L (ref 39–117)
BUN/Creatinine Ratio: 15 (ref 9–23)
BUN: 13 mg/dL (ref 6–20)
Bilirubin Total: <0.2 mg/dL (ref 0.0–1.2)
CO2: 23 mmol/L (ref 20–29)
Calcium: 9.2 mg/dL (ref 8.7–10.2)
Chloride: 104 mmol/L (ref 96–106)
Creatinine, Ser: 0.88 mg/dL (ref 0.57–1.00)
GFR calc Af Amer: 98 mL/min/{1.73_m2} (ref >59)
GFR calc non Af Amer: 85 mL/min/{1.73_m2} (ref >59)
Globulin, Total: 2.4 g/dL (ref 1.5–4.5)
Glucose: 85 mg/dL (ref 65–99)
Potassium: 4.8 mmol/L (ref 3.5–5.2)
Sodium: 141 mmol/L (ref 134–144)
Total Protein: 6.7 g/dL (ref 6.0–8.5)

## 2016-11-20 LAB — TSH: TSH: 2.85 u[IU]/mL (ref 0.450–4.500)

## 2016-11-26 ENCOUNTER — Other Ambulatory Visit: Payer: Self-pay | Admitting: Adult Health Nurse Practitioner

## 2016-11-26 MED ORDER — CIPROFLOXACIN HCL 500 MG PO TABS: 500.0000 mg | ORAL_TABLET | Freq: Two times a day (BID) | ORAL | 0 refills | Status: DC

## 2016-12-03 ENCOUNTER — Ambulatory Visit: Payer: Self-pay | Admitting: Adult Health Nurse Practitioner

## 2016-12-03 VITALS — BP 163/92 | HR 93 | Wt 213.0 lb

## 2016-12-03 DIAGNOSIS — I10 Essential (primary) hypertension: Secondary | ICD-10-CM

## 2016-12-03 MED ORDER — HYDROCHLOROTHIAZIDE 12.5 MG PO TABS: 12.5000 mg | ORAL_TABLET | Freq: Every day | ORAL | 3 refills | Status: DC

## 2016-12-03 NOTE — Progress Notes (Signed)
  Patient: Angie BabaCassandra T Shipman Female    DOB: [birth year]   36 y.o.   MRN: [id number] Visit Date: 12/03/2016  Today's Provider: Jacelyn Pieah Doles-Johnson, NP   Chief Complaint  Patient presents with  . Hypertension   Subjective:    HPI  Here for lab review and BP check.   Pt states that she just got the Cipro for UTI and plans to start today.  Pt states that she has used the Zantac samples with good results.   BP on last visit was 149/89.     Not on File Previous Medications   CIPROFLOXACIN (CIPRO) 500 MG TABLET    Take 1 tablet (500 mg total) by mouth 2 (two) times daily.    Review of Systems  All other systems reviewed and are negative.   Social History  Substance Use Topics  . Smoking status: Never Smoker  . Smokeless tobacco: Not on file  . Alcohol use Not on file   Objective:   BP (!) 163/92 (BP Location: Left Arm, Patient Position: Sitting, Cuff Size: Normal)   Pulse 93   Wt 213 lb (96.6 kg)   Physical Exam  Constitutional: She appears well-developed and well-nourished.  HENT:  Head: Normocephalic and atraumatic.  Cardiovascular: Normal rate, regular rhythm and normal heart sounds.   Pulmonary/Chest: Effort normal and breath sounds normal.  Vitals reviewed.       Assessment & Plan:       LDL is 115.  Encouraged low cholesterol diet.  May take flax seed oil or fish oil daily.   HTN:  Newly diagnosed.   Goal BP <140/80.  Start HCTZ 12.5mg  daily.  Encourage low salt diet and exercise.  FU in 4 weeks for BP check and BMET.   Take Cipro as directed.         Jacelyn Pieah Doles-Johnson, NP   [facility] [place] [place]

## 2016-12-03 NOTE — Patient Instructions (Signed)
Cholesterol Cholesterol is a fat. Your body needs a small amount of cholesterol. Cholesterol (plaque) may build up in your blood vessels (arteries). That makes you more likely to have a heart attack or stroke. You cannot feel your cholesterol level. Having a blood test is the only way to find out if your level is high. Keep your test results. Work with your doctor to keep your cholesterol at a good level. What do the results mean?  Total cholesterol is how much cholesterol is in your blood.  LDL is bad cholesterol. This is the type that can build up. Try to have low LDL.  HDL is good cholesterol. It cleans your blood vessels and carries LDL away. Try to have high HDL.  Triglycerides are fat that the body can store or burn for energy. What are good levels of cholesterol?  Total cholesterol below 200.  LDL below 100 is good for people who have health risks. LDL below 70 is good for people who have very high risks.  HDL above 40 is good. It is best to have HDL of 60 or higher.  Triglycerides below 150. How can I lower my cholesterol? Diet  Follow your diet program as told by your doctor.  Choose fish, white meat chicken, or turkey that is roasted or baked. Try not to eat red meat, fried foods, sausage, or lunch meats.  Eat lots of fresh fruits and vegetables.  Choose whole grains, beans, pasta, potatoes, and cereals.  Choose olive oil, corn oil, or canola oil. Only use small amounts.  Try not to eat butter, mayonnaise, shortening, or palm kernel oils.  Try not to eat foods with trans fats.  Choose low-fat or nonfat dairy foods.  Drink skim or nonfat milk.  Eat low-fat or nonfat yogurt and cheeses.  Try not to drink whole milk or cream.  Try not to eat ice cream, egg yolks, or full-fat cheeses.  Healthy desserts include angel food cake, ginger snaps, animal crackers, hard candy, popsicles, and low-fat or nonfat frozen yogurt. Try not to eat pastries, cakes, pies, and  cookies. Exercise  Follow your exercise program as told by your doctor.  Be more active. Try gardening, walking, and taking the stairs.  Ask your doctor about ways that you can be more active. Medicine  Take over-the-counter and prescription medicines only as told by your doctor. This information is not intended to replace advice given to you by your health care provider. Make sure you discuss any questions you have with your health care provider. Document Released: 07/31/2008 Document Revised: 12/04/2015 Document Reviewed: 11/14/2015 Elsevier Interactive Patient Education  2017 [employer]  

## 2016-12-10 ENCOUNTER — Ambulatory Visit: Payer: Self-pay | Admitting: Licensed Clinical Social Worker

## 2016-12-30 ENCOUNTER — Ambulatory Visit: Payer: Self-pay | Admitting: Internal Medicine

## 2016-12-31 ENCOUNTER — Ambulatory Visit: Payer: Self-pay

## 2017-01-21 ENCOUNTER — Other Ambulatory Visit: Payer: Self-pay

## 2017-01-21 ENCOUNTER — Ambulatory Visit: Payer: Self-pay

## 2017-01-21 NOTE — Telephone Encounter (Signed)
A user error has taken place.

## 2017-01-27 ENCOUNTER — Emergency Department (HOSPITAL_COMMUNITY)
Admission: EM | Admit: 2017-01-27 | Discharge: 2017-01-27 | Disposition: A | Discharge status: Home / Self Care | Payer: Self-pay

## 2017-01-28 ENCOUNTER — Ambulatory Visit: Payer: Self-pay

## 2017-02-02 ENCOUNTER — Telehealth: Payer: Self-pay | Admitting: Nurse Practitioner

## 2017-02-02 NOTE — Telephone Encounter (Signed)
Cancelled appointment for 02/02/17

## 2017-02-09 ENCOUNTER — Ambulatory Visit: Payer: Self-pay

## 2017-08-01 ENCOUNTER — Emergency Department (HOSPITAL_COMMUNITY): Payer: Commercial Managed Care - PPO

## 2017-08-01 ENCOUNTER — Emergency Department (HOSPITAL_COMMUNITY)
Admission: EM | Admit: 2017-08-01 | Discharge: 2017-08-01 | Disposition: A | Discharge status: Home / Self Care | Payer: Commercial Managed Care - PPO | Attending: Emergency Medicine | Admitting: Emergency Medicine

## 2017-08-01 ENCOUNTER — Other Ambulatory Visit: Payer: Self-pay

## 2017-08-01 ENCOUNTER — Encounter (HOSPITAL_COMMUNITY): Payer: Self-pay

## 2017-08-01 DIAGNOSIS — I10 Essential (primary) hypertension: Secondary | ICD-10-CM | POA: Diagnosis not present

## 2017-08-01 DIAGNOSIS — R519 Headache, unspecified: Secondary | ICD-10-CM

## 2017-08-01 DIAGNOSIS — R51 Headache: Secondary | ICD-10-CM | POA: Insufficient documentation

## 2017-08-01 DIAGNOSIS — W57XXXA Bitten or stung by nonvenomous insect and other nonvenomous arthropods, initial encounter: Secondary | ICD-10-CM | POA: Insufficient documentation

## 2017-08-01 DIAGNOSIS — Z79899 Other long term (current) drug therapy: Secondary | ICD-10-CM | POA: Diagnosis not present

## 2017-08-01 LAB — BASIC METABOLIC PANEL
Anion gap: 9 (ref 5–15)
BUN: 11 mg/dL (ref 6–20)
CO2: 27 mmol/L (ref 22–32)
Calcium: 8.9 mg/dL (ref 8.9–10.3)
Chloride: 101 mmol/L (ref 101–111)
Creatinine, Ser: 0.94 mg/dL (ref 0.44–1.00)
GFR calc Af Amer: >60 mL/min (ref >60)
GFR calc non Af Amer: >60 mL/min (ref >60)
Glucose, Bld: 84 mg/dL (ref 65–99)
Potassium: 3.3 mmol/L — ABNORMAL LOW (ref 3.5–5.1)
Sodium: 137 mmol/L (ref 135–145)

## 2017-08-01 LAB — CBC
HCT: 37.1 % (ref 36.0–46.0)
Hemoglobin: 12.1 g/dL (ref 12.0–15.0)
MCH: 21.3 pg — ABNORMAL LOW (ref 26.0–34.0)
MCHC: 32.6 g/dL (ref 30.0–36.0)
MCV: 65.2 fL — ABNORMAL LOW (ref 78.0–100.0)
Platelets: 294 10*3/uL (ref 150–400)
RBC: 5.69 MIL/uL — ABNORMAL HIGH (ref 3.87–5.11)
RDW: 16.4 % — ABNORMAL HIGH (ref 11.5–15.5)
WBC: 11.6 10*3/uL — ABNORMAL HIGH (ref 4.0–10.5)

## 2017-08-01 LAB — I-STAT BETA HCG BLOOD, ED (MC, WL, AP ONLY): I-stat hCG, quantitative: <5 m[IU]/mL (ref <5)

## 2017-08-01 IMAGING — CT CT HEAD W/O CM
3 series · 14 of 47 positions shown, 16 images · non-contrast
Comparison: None.

CLINICAL DATA: Persistent headache with hypertension.

EXAM:
CT HEAD WITHOUT CONTRAST
TECHNIQUE: Contiguous axial images were obtained from the base of the skull
through the vertex without intravenous contrast.

[Series 2: head wo · axial · 0.47mm/px · z∈[+1623,+1753]mm · 8 of 32 slices shown, 10 images]
[im 3/32  brain]
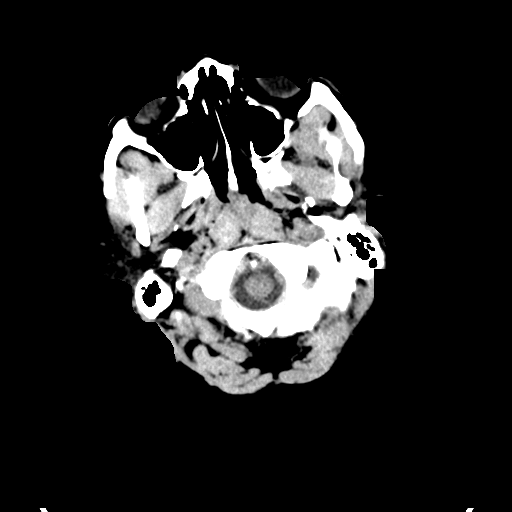
[im 3/32  bone]
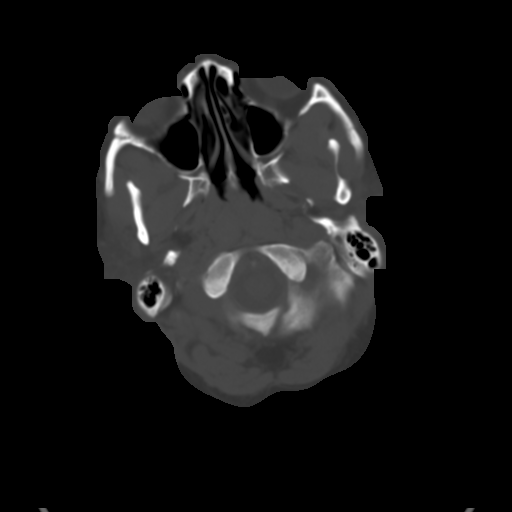
[im 7/32  brain]
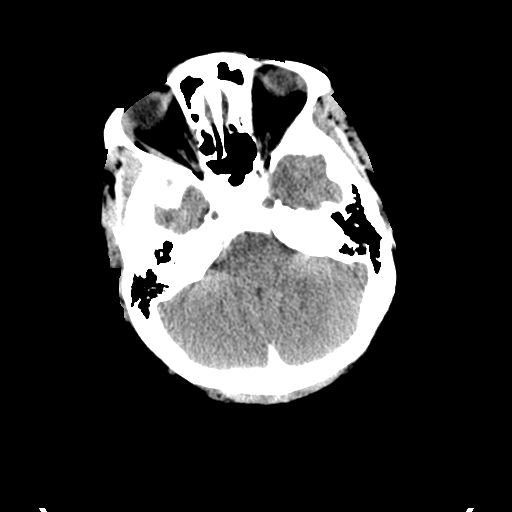
[im 10/32  brain]
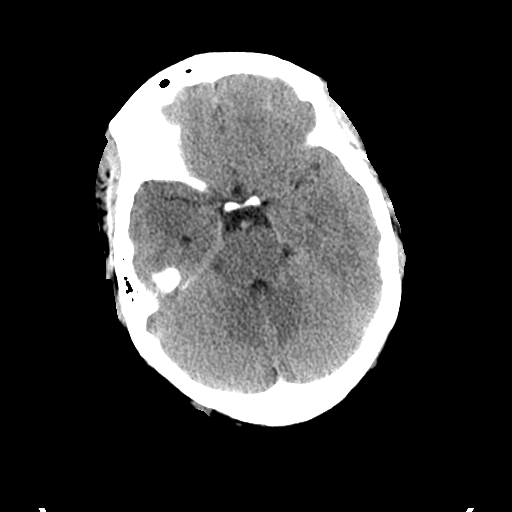
[im 14/32  brain]
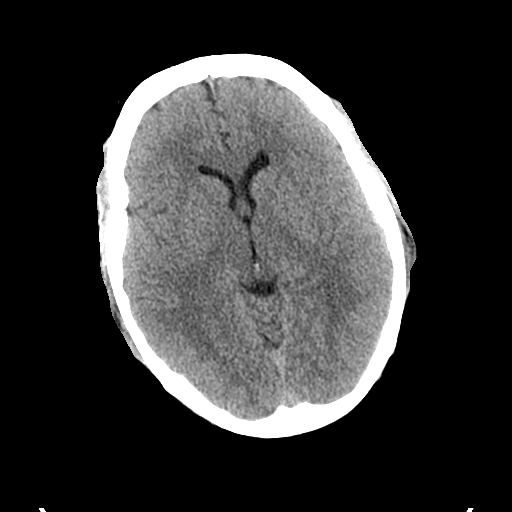
[im 18/32  brain]
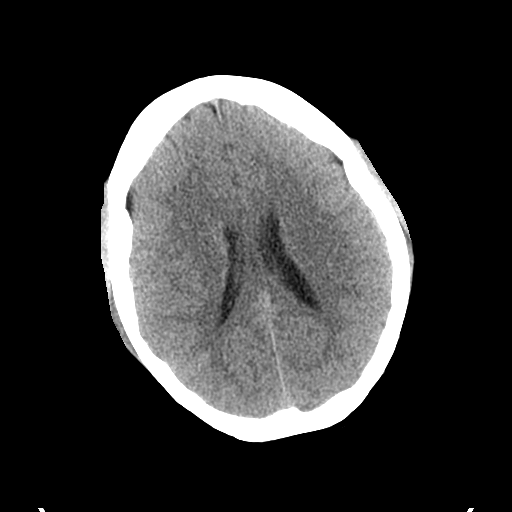
[im 18/32  bone]
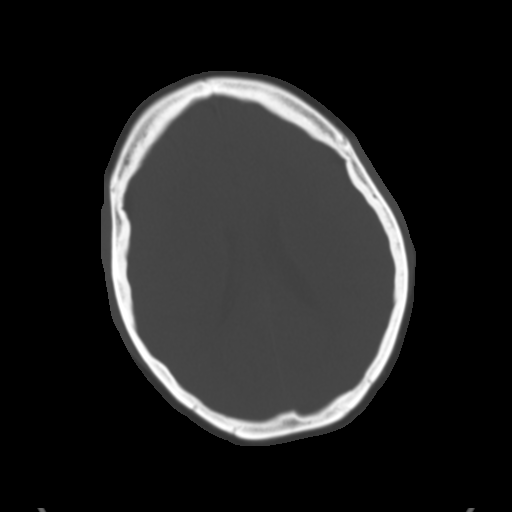
[im 22/32  brain]
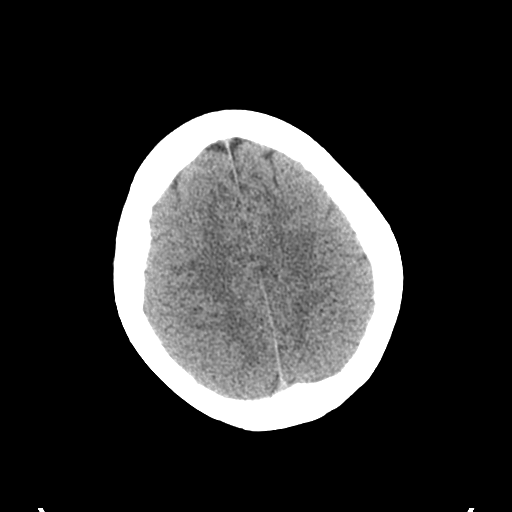
[im 25/32  brain]
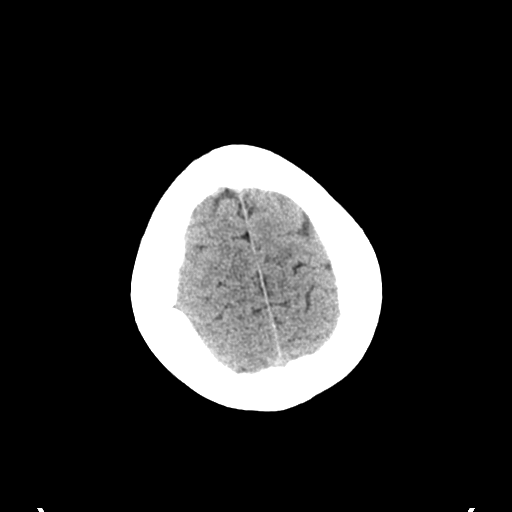
[im 29/32  brain]
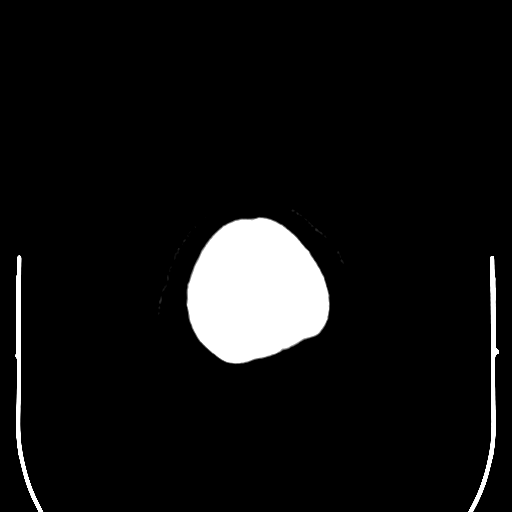

[Series 4: coronal soft tissue · coronal · 0.30mm/px · 3 of 84 slices shown]
[im 28/84  brain]
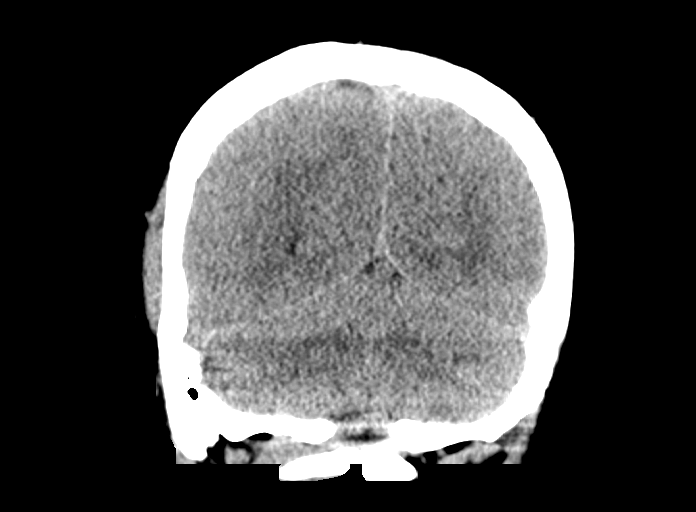
[im 37/84  brain]
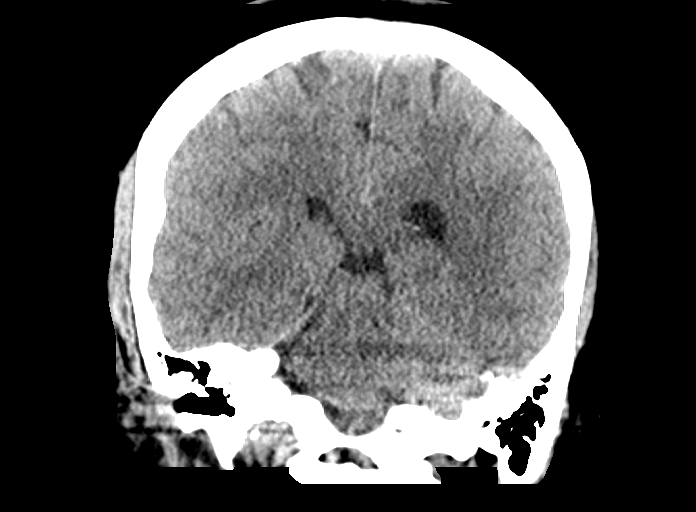
[im 47/84  brain]
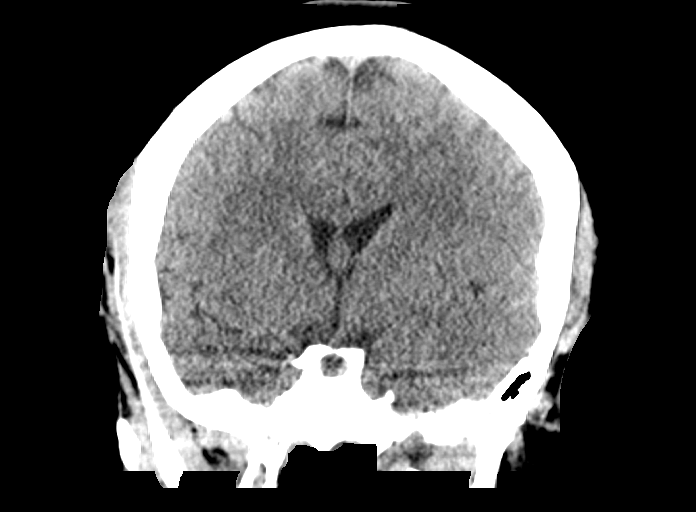

[Series 5: sagittal soft tissue · sagittal · 0.30mm/px · 3 of 83 slices shown]
[im 28/83  brain]
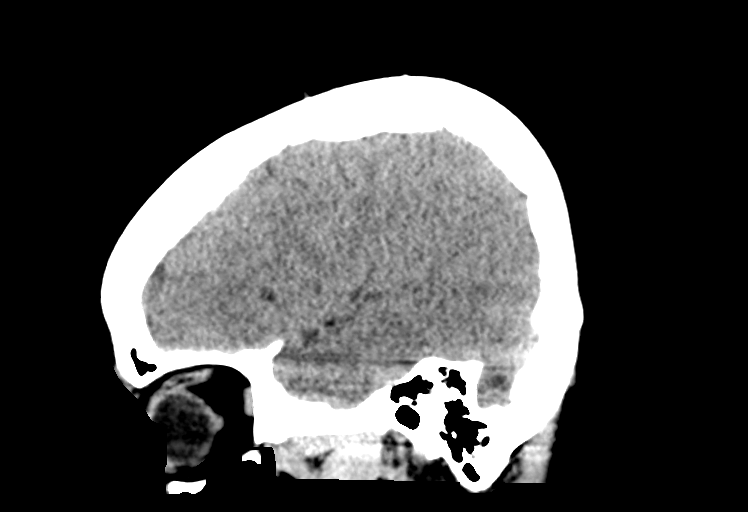
[im 42/83  brain]
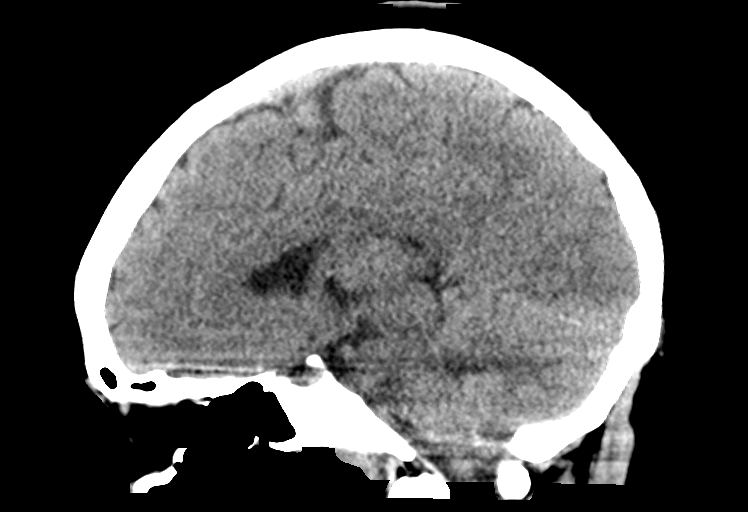
[im 55/83  brain]
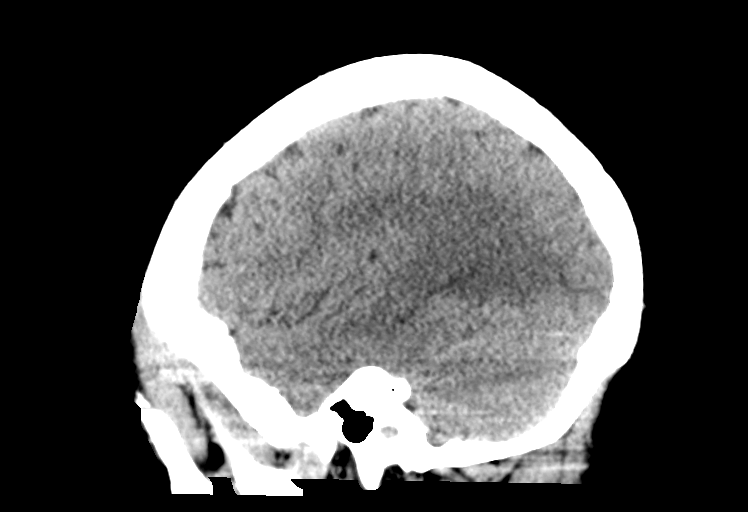

[14 of 47 positions shown; findings below may reference images not displayed]

FINDINGS: BRAIN: The ventricles and sulci are normal. No intraparenchymal
hemorrhage, mass effect nor midline shift. No acute large vascular
territory infarcts. Grey-white matter distinction is maintained. The
basal ganglia are unremarkable. No abnormal extra-axial fluid
collections. Basal cisterns are not effaced and midline. The
brainstem and cerebellar hemispheres are without acute
abnormalities.

VASCULAR: Unremarkable.

SKULL/SOFT TISSUES: No skull fracture. No significant soft tissue
swelling.

ORBITS/SINUSES: The included ocular globes and orbital contents are
normal.The mastoid air cells are clear. The included paranasal
sinuses are well-aerated.

OTHER: None.
IMPRESSION: Normal head CT

## 2017-08-01 MED ORDER — METOCLOPRAMIDE HCL 10 MG PO TABS
10.0000 mg | ORAL_TABLET | Freq: Once | ORAL | Status: AC
  Administered 2017-08-01: 10 mg via ORAL
  Filled 2017-08-01: qty 1

## 2017-08-01 MED ORDER — SODIUM CHLORIDE 0.9 % IV BOLUS (SEPSIS)
500.0000 mL | Freq: Once | INTRAVENOUS | Status: AC
  Administered 2017-08-01: 500 mL via INTRAVENOUS

## 2017-08-01 MED ORDER — KETOROLAC TROMETHAMINE 15 MG/ML IJ SOLN
15.0000 mg | Freq: Once | INTRAMUSCULAR | Status: AC
  Administered 2017-08-01: 15 mg via INTRAVENOUS
  Filled 2017-08-01: qty 1

## 2017-08-01 MED ORDER — DIPHENHYDRAMINE HCL 25 MG PO CAPS
25.0000 mg | ORAL_CAPSULE | Freq: Once | ORAL | Status: AC
  Administered 2017-08-01: 25 mg via ORAL
  Filled 2017-08-01: qty 1

## 2017-08-01 NOTE — ED Provider Notes (Signed)
[facility]-EMERGENCY DEPT Provider Note   CSN: [id number] Arrival date & time: 08/01/17  1611     History   Chief Complaint Chief Complaint  Patient presents with  . Headache  . Nasal Congestion  . Insect Bite    HPI Angie Roberts is a 37 y.o. female.  HPI   Patient is a 37 year old 37-year-old female who presents the ED complaining of intermittent headaches for the last week.  Rates headache 5/10 when she has it.  Initially headache was posterior. Then moved to bilateral eyes. Was concerned that headache was related to recent sinus infection and started taking sudafed.  Denies any photophobia or vision changes.  Denies any eye pain.  States she was concerned that the headache was due to sinus infection so started taking Flonase and Sudafed with no relief.  Has also tried taking Tylenol ibuprofen which improved her symptoms temporarily.  Used to have migraines that were triggered by allergies. States symptoms are somewhat similar to past migraines, however has not had one in years.  She denies any cough, sore throat, abdominal pain, nausea, vomiting, diarrhea, chest pain, shortness of breath.  Does note that she has been feeling fatigued and intermittently lightheaded recently.  Denies any vision changes, abnormal, coordination, numbness, weakness, tingling, syncope.  Denies any fevers.  Patient states that she was bit by a bug on her abdomen last week and had erythematous area to her abdomen.  This has since resolved after she used hydrocortisone cream. Pt showed a picture of bug that bit her last week and it was consistent with bedbug.   Pt states she had not taken her blood pressure medicines this morning before she was triaged, states she took the medicine after she was triaged.   History reviewed. No pertinent past medical history.  Patient Active Problem List   Diagnosis Date Noted  . Essential hypertension 12/03/2016  . Healthcare maintenance  11/17/2016  . Dysuria 11/17/2016    History reviewed. No pertinent surgical history.  OB History    No data available       Home Medications    Prior to Admission medications   Medication Sig Start Date End Date Taking? Authorizing Provider  aspirin-acetaminophen-caffeine (EXCEDRIN MIGRAINE) [phone] MG tablet Take 1 tablet by mouth every 6 (six) hours as needed for headache.   Yes [provider]  fluticasone (FLONASE) 50 MCG/ACT nasal spray Place 1 spray into both nostrils daily as needed for allergies or rhinitis.   Yes [provider]  hydrochlorothiazide (HYDRODIURIL) 25 MG tablet Take 25 mg by mouth daily.   Yes [provider]  ibuprofen (ADVIL,MOTRIN) 200 MG tablet Take 400 mg by mouth every 6 (six) hours as needed for headache or moderate pain.   Yes [provider]  omeprazole (PRILOSEC) 20 MG capsule Take 20 mg by mouth daily.   Yes [provider]  pseudoephedrine (SUDAFED) 120 MG 12 hr tablet Take 120 mg by mouth every 12 (twelve) hours as needed for congestion.   Yes [provider]  ciprofloxacin (CIPRO) 500 MG tablet Take 1 tablet (500 mg total) by mouth 2 (two) times daily. Patient not taking: Reported on 08/01/2017 11/26/16   Doles-Johnson, Teah, NP    Family History History reviewed. No pertinent family history.  Social History Social History   Tobacco Use  . Smoking status: Never Smoker  Substance Use Topics  . Alcohol use: Not on file  . Drug use: Not on file  Allergies   Patient has no known allergies.   Review of Systems Review of Systems  Constitutional: Positive for fatigue. Negative for fever.  HENT: Negative for congestion, rhinorrhea and sore throat.   Eyes: Negative for photophobia and visual disturbance.  Respiratory: Negative for chest tightness and shortness of breath.   Cardiovascular: Negative for chest pain.  Gastrointestinal: Negative for abdominal pain, constipation,  diarrhea, nausea and vomiting.  Genitourinary: Negative for dysuria, flank pain and urgency.  Musculoskeletal: Negative for back pain, neck pain and neck stiffness.  Skin: Negative for rash.  Neurological: Positive for dizziness, light-headedness and headaches. Negative for syncope, weakness and numbness.     Physical Exam Updated Vital Signs BP (!) 135/103   Pulse 87   Temp 98 F (36.7 C) (Oral)   Resp 16   SpO2 100%   Physical Exam  Constitutional: She appears well-developed and well-nourished. No distress.  Nontoxic appearing, no acute distress  HENT:  Head: Normocephalic and atraumatic.  No pharyngeal erythema or exudates.  Uvula midline  Eyes: Conjunctivae and EOM are normal. Pupils are equal, round, and reactive to light. Right eye exhibits normal extraocular motion and no nystagmus. Left eye exhibits normal extraocular motion and no nystagmus. Right pupil is round and reactive. Left pupil is round and reactive. Pupils are equal.  Neck: Normal range of motion. Neck supple. No neck rigidity.  Cardiovascular: Normal rate, regular rhythm, normal heart sounds and intact distal pulses.  No murmur heard. Pulmonary/Chest: Effort normal and breath sounds normal. No stridor. No respiratory distress. She has no wheezes.  Abdominal: Soft. Bowel sounds are normal. She exhibits no distension. There is no tenderness.  Musculoskeletal: She exhibits no edema.  Neurological: She is alert.  Mental Status:  Alert, thought content appropriate, able to give a coherent history. Speech fluent without evidence of aphasia. Able to follow 2 step commands without difficulty.  Cranial Nerves:  II:  Peripheral visual fields grossly normal, pupils equal, round, reactive to light III,IV, VI: ptosis not present, extra-ocular motions intact bilaterally  V,VII: smile symmetric, facial light touch sensation equal VIII: hearing grossly normal to voice  X: uvula elevates symmetrically  XI: bilateral shoulder  shrug symmetric and strong XII: midline tongue extension without fassiculations Motor:  Normal tone. 5/5 strength of BUE and BLE major muscle groups including strong and equal grip strength and dorsiflexion/plantar flexion Sensory: light touch normal in all extremities. DTRs: patellar and achilles 2+ symmetric b/l Cerebellar: normal finger-to-nose with bilateral upper extremities Gait: normal gait and balance. Able to walk on toes and heels with ease.  CV: 2+ radial and DP/PT pulses No pronator drift, negative Romberg  Skin: Skin is warm and dry. Capillary refill takes less than 2 seconds.  Psychiatric: She has a normal mood and affect.  Nursing note and vitals reviewed.    ED Treatments / Results  Labs (all labs ordered are listed, but only abnormal results are displayed) Labs Reviewed  CBC - Abnormal; Notable for the following components:      Result Value   WBC 11.6 (*)    RBC 5.69 (*)    MCV 65.2 (*)    MCH 21.3 (*)    RDW 16.4 (*)    All other components within normal limits  BASIC METABOLIC PANEL - Abnormal; Notable for the following components:   Potassium 3.3 (*)    All other components within normal limits  I-STAT BETA HCG BLOOD, ED ([facility], [facility], [facility] ONLY)    EKG  EKG Interpretation  None       Radiology Ct Head Wo Contrast  Result Date: 08/01/2017 CLINICAL DATA:  Persistent headache with hypertension. EXAM: CT HEAD WITHOUT CONTRAST TECHNIQUE: Contiguous axial images were obtained from the base of the skull through the vertex without intravenous contrast. COMPARISON:  None. FINDINGS: BRAIN: The ventricles and sulci are normal. No intraparenchymal hemorrhage, mass effect nor midline shift. No acute large vascular territory infarcts. Grey-white matter distinction is maintained. The basal ganglia are unremarkable. No abnormal extra-axial fluid collections. Basal cisterns are not effaced and midline. The brainstem and cerebellar hemispheres are without acute abnormalities.  VASCULAR: Unremarkable. SKULL/SOFT TISSUES: No skull fracture. No significant soft tissue swelling. ORBITS/SINUSES: The included ocular globes and orbital contents are normal.The mastoid air cells are clear. The included paranasal sinuses are well-aerated. OTHER: None. IMPRESSION: Normal head CT Electronically Signed   By: Tollie Eth M.D.   On: 08/01/2017 21:43    Procedures Procedures (including critical care time)  Medications Ordered in ED Medications  ketorolac (TORADOL) 15 MG/ML injection 15 mg (15 mg Intravenous Given 08/01/17 1932)  sodium chloride 0.9 % bolus 500 mL (0 mLs Intravenous Stopped 08/01/17 2006)  metoCLOPramide (REGLAN) tablet 10 mg (10 mg Oral Given 08/01/17 2133)  diphenhydrAMINE (BENADRYL) capsule 25 mg (25 mg Oral Given 08/01/17 2133)     Initial Impression / Assessment and Plan / ED Course  I have reviewed the triage vital signs and the nursing notes.  Pertinent labs & imaging results that were available during my care of the patient were reviewed by me and considered in my medical decision making (see chart for details).    Discussed pt presentation and exam findings with Dr. Silverio Lay, who agrees with the workup and plan for d/c.   Final Clinical Impressions(s) / ED Diagnoses   Final diagnoses:  Nonintractable headache, unspecified chronicity pattern, unspecified headache type  Bedbug bite, initial encounter   Pt HA treated and improved while in ED. presentation is similar to patient's past migraines, however she has not had a migraine in years.  Reports that associated intermittent dizziness/lightheadedness was not present with prior migraines.  CT scan was obtained and was negative.  Patient symptoms improved after migraine cocktail. Lightheadedness and dizziness also improved after migraine cocktail as well.  Exam without focal neuro deficits.  Negative Romberg.   Non concerning for [facility], ICH, Meningitis, or temporal arteritis. Pt is afebrile with no focal neuro  deficits, nuchal rigidity, or change in vision.  Lab work is unremarkable, slight leukocytosis which is nonspecific.  Pt is to follow up with PCP in 1 week for re-eval. Pt verbalizes understanding and is agreeable with plan to dc.  All questions were answered and patient is comfortable with the plan for discharge.  ED Discharge Orders    None       Angie Roberts 08/01/17 2236    Charlynne Pander, MD 08/01/17 [phone]

## 2017-08-01 NOTE — ED Triage Notes (Signed)
Pt reports congestion and a headache for the last 3 days. She thought that it was a sinus infection so she has been taking sudafed. She reports that her symptoms have not improved. She is now thinking that it could be related to a bug bite that she had last week (she has a picture). She also reports lethargy. Denies N/V/D. A&OX4. Ambulatory.

## 2017-08-01 NOTE — Discharge Instructions (Signed)
You should follow-up with your primary care doctor in about 1 week for reevaluation.  You should return to the emergency department for any new or worsening symptoms including any fevers, persistent headaches, neck stiffness, vision changes, lightheadedness, dizziness, or any new or worsening symptoms.  Additionally, you were noted to have high blood pressure today during your visit in the emergency department. You will need to follow up with your primary healthcare provider to have your blood pressure rechecked as you may need to be started on medication for this if it remains elevated. If you experience any chest pain, shortness of breath, headaches, vision changes, numbness, weakness, lightheadedness, changes in mental status, or decrease in urination you should return to the emergency department immediately.

## 2017-11-02 ENCOUNTER — Encounter: Payer: Self-pay | Admitting: Physician Assistant

## 2017-11-02 ENCOUNTER — Other Ambulatory Visit: Payer: Self-pay

## 2017-11-02 ENCOUNTER — Ambulatory Visit (INDEPENDENT_AMBULATORY_CARE_PROVIDER_SITE_OTHER): Payer: Commercial Managed Care - PPO | Admitting: Physician Assistant

## 2017-11-02 VITALS — BP 137/89 | HR 74 | Temp 98.6°F | Ht 65.0 in | Wt 199.6 lb

## 2017-11-02 DIAGNOSIS — Z862 Personal history of diseases of the blood and blood-forming organs and certain disorders involving the immune mechanism: Secondary | ICD-10-CM

## 2017-11-02 DIAGNOSIS — Z124 Encounter for screening for malignant neoplasm of cervix: Secondary | ICD-10-CM | POA: Diagnosis not present

## 2017-11-02 DIAGNOSIS — R350 Frequency of micturition: Secondary | ICD-10-CM

## 2017-11-02 DIAGNOSIS — N898 Other specified noninflammatory disorders of vagina: Secondary | ICD-10-CM

## 2017-11-02 LAB — POCT WET + KOH PREP
Trich by wet prep: ABSENT
Yeast by KOH: ABSENT
Yeast by wet prep: ABSENT

## 2017-11-02 LAB — POCT URINALYSIS DIP (MANUAL ENTRY)
Bilirubin, UA: NEGATIVE
Blood, UA: NEGATIVE
Glucose, UA: NEGATIVE mg/dL
Ketones, POC UA: NEGATIVE mg/dL
Leukocytes, UA: NEGATIVE
Nitrite, UA: NEGATIVE
Protein Ur, POC: NEGATIVE mg/dL
Spec Grav, UA: 1.015 (ref 1.010–1.025)
Urobilinogen, UA: 0.2 E.U./dL
pH, UA: 5.5 (ref 5.0–8.0)

## 2017-11-02 MED ORDER — METRONIDAZOLE 0.75 % VA GEL: 1.0000 | Freq: Every day | VAGINAL | 0 refills | Status: AC

## 2017-11-02 NOTE — Patient Instructions (Addendum)
  You are negative for a yeast infection and bacterial vaginosis.  Metrogel - apply one applicator at bedtime for the next 5 nights.  Be sure you are drinking at least 32 oz water/daily, eat probiotics, Sheila Oats, kombucha.    Thank you for coming in today. I hope you feel we met your needs.  Feel free to call PCP if you have any questions or further requests.  Please consider signing up for MyChart if you do not already have it, as this is a great way to communicate with me.  Best,  Whitney McVey, PA-C  IF you received an x-ray today, you will receive an invoice from [facility]. Please contact [facility] at [phone] with questions or concerns regarding your invoice.   IF you received labwork today, you will receive an invoice from [facility]. Please contact [facility] at [phone] with questions or concerns regarding your invoice.   Our billing staff will not be able to assist you with questions regarding bills from these companies.  You will be contacted with the lab results as soon as they are available. The fastest way to get your results is to activate your My Chart account. Instructions are located on the last page of this paperwork. If you have not heard from [place] regarding the results in 2 weeks, please contact this office.

## 2017-11-02 NOTE — Progress Notes (Signed)
Angie Roberts  MRN: [id number] DOB: [birth year]  PCP: Lavinia SharpsPlacey, Mary Ann, NP  Subjective:  Pt is a 37 year old female who presents to clinic for vaginal discharge x 1 month. Discharge is clear. Endorses fowl oder when she was on menstral cycle last month. Periods are heavy.  Endorses increased urgency x 1 month.  Denies fever, chills, back pain, flank pain, burning with urination, blood in urine.  She declines STD testing today.  Last yeast infection was over a decade ago.   [facility] No h/o abnormal PAP.   Iron deficiency anemia in 2016. She is not taking anything for this. Last check was about 6 months ago. Denies symptoms.   Review of Systems  Constitutional: Negative for chills, fatigue and fever.  Gastrointestinal: Negative for abdominal pain, diarrhea, nausea and vomiting.  Genitourinary: Positive for vaginal discharge. Negative for decreased urine volume, difficulty urinating, dysuria, enuresis, flank pain, frequency, hematuria and urgency.  Musculoskeletal: Negative for back pain.  Neurological: Negative for dizziness, syncope, light-headedness and headaches.    Patient Active Problem List   Diagnosis Date Noted  . Essential hypertension 12/03/2016  . Healthcare maintenance 11/17/2016  . Dysuria 11/17/2016    Current Outpatient Medications on File Prior to Visit  Medication Sig Dispense Refill  . fluticasone (FLONASE) 50 MCG/ACT nasal spray Place 1 spray into both nostrils daily as needed for allergies or rhinitis.     No current facility-administered medications on file prior to visit.     No Known Allergies   Objective:  BP 137/89 (BP Location: Left Arm, Patient Position: Sitting, Cuff Size: Normal)   Pulse 74   Temp 98.6 F (37 C) (Oral)   Ht 5\' 5"  (1.651 m)   Wt 199 lb 9.6 oz (90.5 kg)   BMI 33.22 kg/m   Physical Exam  Constitutional: She is oriented to person, place, and time. No distress.  Genitourinary: Vagina  normal. Cervix exhibits discharge (clear, sticky ). Cervix exhibits no motion tenderness. Right adnexum displays no mass and no tenderness. Left adnexum displays no mass and no tenderness.  Neurological: She is alert and oriented to person, place, and time.  Skin: Skin is warm and dry.  Psychiatric: Judgment normal.  Vitals reviewed.   Results for orders placed or performed in visit on 11/02/17  POCT Wet + KOH Prep  Result Value Ref Range   Yeast by KOH Absent Absent   Yeast by wet prep Absent Absent   WBC by wet prep Few Few   Clue Cells Wet Prep HPF POC None None   Trich by wet prep Absent Absent   Bacteria Wet Prep HPF POC Few Few   Epithelial Cells By [employer]et Pref ([facility]) Few None, Few, Too numerous to count   RBC,UR,HPF,POC None None RBC/hpf  POCT urinalysis dipstick  Result Value Ref Range   Color, UA yellow yellow   Clarity, UA clear clear   Glucose, UA negative negative mg/dL   Bilirubin, UA negative negative   Ketones, POC UA negative negative mg/dL   Spec Grav, UA [id number] [id number] - 1.025   Blood, UA negative negative   pH, UA 5.5 5.0 - 8.0   Protein Ur, POC negative negative mg/dL   Urobilinogen, UA 0.2 0.2 or 1.0 E.U./dL   Nitrite, UA Negative Negative   Leukocytes, UA Negative Negative    Assessment and Plan :  1. Vaginal discharge - POCT Wet + KOH Prep - metroNIDAZOLE (METROGEL VAGINAL) 0.75 %  vaginal gel; Place 1 Applicatorful vaginally at bedtime for 5 days.  Dispense: 70 g; Refill: 0 - pt presents for vaginal discharge and odor, declines STD screening today. Negative wet prep and UA. +bacteria on wet prep - Plan to cover with Metrogel. RTC if symptoms fail to improve.  2. Urinary frequency - POCT urinalysis dipstick - Urine Culture  3. Screening for cervical cancer - Pap IG and HPV (high risk) DNA detection - Pending.  4. History of iron deficiency anemia - CBC with Differential/Platelet - Iron, TIBC and Ferritin Panel - h/o IDA. C/o heavy periods. Pending.    Marco Collie, PA-C  Primary Care at [facility] 11/02/2017 1:26 PM

## 2017-11-03 ENCOUNTER — Telehealth: Payer: Self-pay | Admitting: Physician Assistant

## 2017-11-03 LAB — CBC WITH DIFFERENTIAL/PLATELET
Basophils Absolute: 0 10*3/uL (ref 0.0–0.2)
Basos: 0 %
EOS (ABSOLUTE): 0.1 10*3/uL (ref 0.0–0.4)
Eos: 1 %
Hematocrit: 34.8 % (ref 34.0–46.6)
Hemoglobin: 10.9 g/dL — ABNORMAL LOW (ref 11.1–15.9)
Immature Grans (Abs): 0 10*3/uL (ref 0.0–0.1)
Immature Granulocytes: 0 %
Lymphocytes Absolute: 2.6 10*3/uL (ref 0.7–3.1)
Lymphs: 24 %
MCH: 21 pg — ABNORMAL LOW (ref 26.6–33.0)
MCHC: 31.3 g/dL — ABNORMAL LOW (ref 31.5–35.7)
MCV: 67 fL — ABNORMAL LOW (ref 79–97)
Monocytes Absolute: 0.7 10*3/uL (ref 0.1–0.9)
Monocytes: 6 %
Neutrophils Absolute: 7.3 10*3/uL — ABNORMAL HIGH (ref 1.4–7.0)
Neutrophils: 69 %
Platelets: 294 10*3/uL (ref 150–450)
RBC: 5.2 x10E6/uL (ref 3.77–5.28)
RDW: 17.6 % — ABNORMAL HIGH (ref 12.3–15.4)
WBC: 10.6 10*3/uL (ref 3.4–10.8)

## 2017-11-03 LAB — URINE CULTURE

## 2017-11-03 LAB — IRON,TIBC AND FERRITIN PANEL
Ferritin: 24 ng/mL (ref 15–150)
Iron Saturation: 23 % (ref 15–55)
Iron: 73 ug/dL (ref 27–159)
Total Iron Binding Capacity: 313 ug/dL (ref 250–450)
UIBC: 240 ug/dL (ref 131–425)

## 2017-11-03 NOTE — Telephone Encounter (Signed)
Copied from CRM [phone]. Topic: Quick Communication - Rx Refill/Question >> Nov 03, 2017  1:44 PM Alexander BergeronBarksdale, Angie B wrote: Medication: metroNIDAZOLE (METROGEL VAGINAL) 0.75 % vaginal gel [[id number]][[id number]]   Pt called to state that the medication above is too expensive; pt needs another medication that is more economic  Pharmacy: [facility] on [address]

## 2017-11-04 NOTE — Telephone Encounter (Signed)
Patient is requesting an alternative medication.

## 2017-11-05 LAB — PAP IG AND HPV HIGH-RISK
HPV, high-risk: NEGATIVE
PAP Smear Comment: 0

## 2017-11-05 NOTE — Telephone Encounter (Signed)
[facility]

## 2017-11-08 ENCOUNTER — Encounter: Payer: Self-pay | Admitting: Physician Assistant

## 2017-11-08 NOTE — Progress Notes (Signed)
Negative PAP. Next PAP in 3 years. Result letter sent in mail

## 2018-04-15 ENCOUNTER — Encounter (HOSPITAL_COMMUNITY): Payer: Self-pay

## 2018-04-15 ENCOUNTER — Other Ambulatory Visit: Payer: Self-pay

## 2018-04-15 ENCOUNTER — Emergency Department (HOSPITAL_COMMUNITY)
Admission: EM | Admit: 2018-04-15 | Discharge: 2018-04-15 | Disposition: A | Discharge status: Home / Self Care | Payer: Self-pay | Attending: Emergency Medicine | Admitting: Emergency Medicine

## 2018-04-15 ENCOUNTER — Emergency Department (HOSPITAL_COMMUNITY): Payer: Self-pay

## 2018-04-15 DIAGNOSIS — M940 Chondrocostal junction syndrome [Tietze]: Secondary | ICD-10-CM | POA: Insufficient documentation

## 2018-04-15 DIAGNOSIS — B349 Viral infection, unspecified: Secondary | ICD-10-CM | POA: Insufficient documentation

## 2018-04-15 DIAGNOSIS — I10 Essential (primary) hypertension: Secondary | ICD-10-CM | POA: Insufficient documentation

## 2018-04-15 LAB — CBC
HCT: 41.2 % (ref 36.0–46.0)
Hemoglobin: 12.9 g/dL (ref 12.0–15.0)
MCH: 22 pg — ABNORMAL LOW (ref 26.0–34.0)
MCHC: 31.3 g/dL (ref 30.0–36.0)
MCV: 70.2 fL — ABNORMAL LOW (ref 80.0–100.0)
Platelets: 300 10*3/uL (ref 150–400)
RBC: 5.87 MIL/uL — ABNORMAL HIGH (ref 3.87–5.11)
RDW: 15.7 % — ABNORMAL HIGH (ref 11.5–15.5)
WBC: 11.1 10*3/uL — ABNORMAL HIGH (ref 4.0–10.5)
nRBC: 0 % (ref 0.0–0.2)

## 2018-04-15 LAB — BASIC METABOLIC PANEL
Anion gap: 7 (ref 5–15)
BUN: 7 mg/dL (ref 6–20)
CO2: 29 mmol/L (ref 22–32)
Calcium: 9.4 mg/dL (ref 8.9–10.3)
Chloride: 102 mmol/L (ref 98–111)
Creatinine, Ser: 0.83 mg/dL (ref 0.44–1.00)
GFR calc Af Amer: >60 mL/min (ref >60)
GFR calc non Af Amer: >60 mL/min (ref >60)
Glucose, Bld: 104 mg/dL — ABNORMAL HIGH (ref 70–99)
Potassium: 3.5 mmol/L (ref 3.5–5.1)
Sodium: 138 mmol/L (ref 135–145)

## 2018-04-15 LAB — I-STAT BETA HCG BLOOD, ED (MC, WL, AP ONLY): I-stat hCG, quantitative: <5 m[IU]/mL (ref <5)

## 2018-04-15 LAB — I-STAT TROPONIN, ED: Troponin i, poc: 0 ng/mL (ref 0.00–0.08)

## 2018-04-15 IMAGING — CR DG CHEST 2V
2 series · 2 of 2 positions shown · non-contrast
Comparison: [DATE].

CLINICAL DATA: Chest pain.  Shortness of breath.

EXAM:
CHEST - 2 VIEW

[w chest pa]
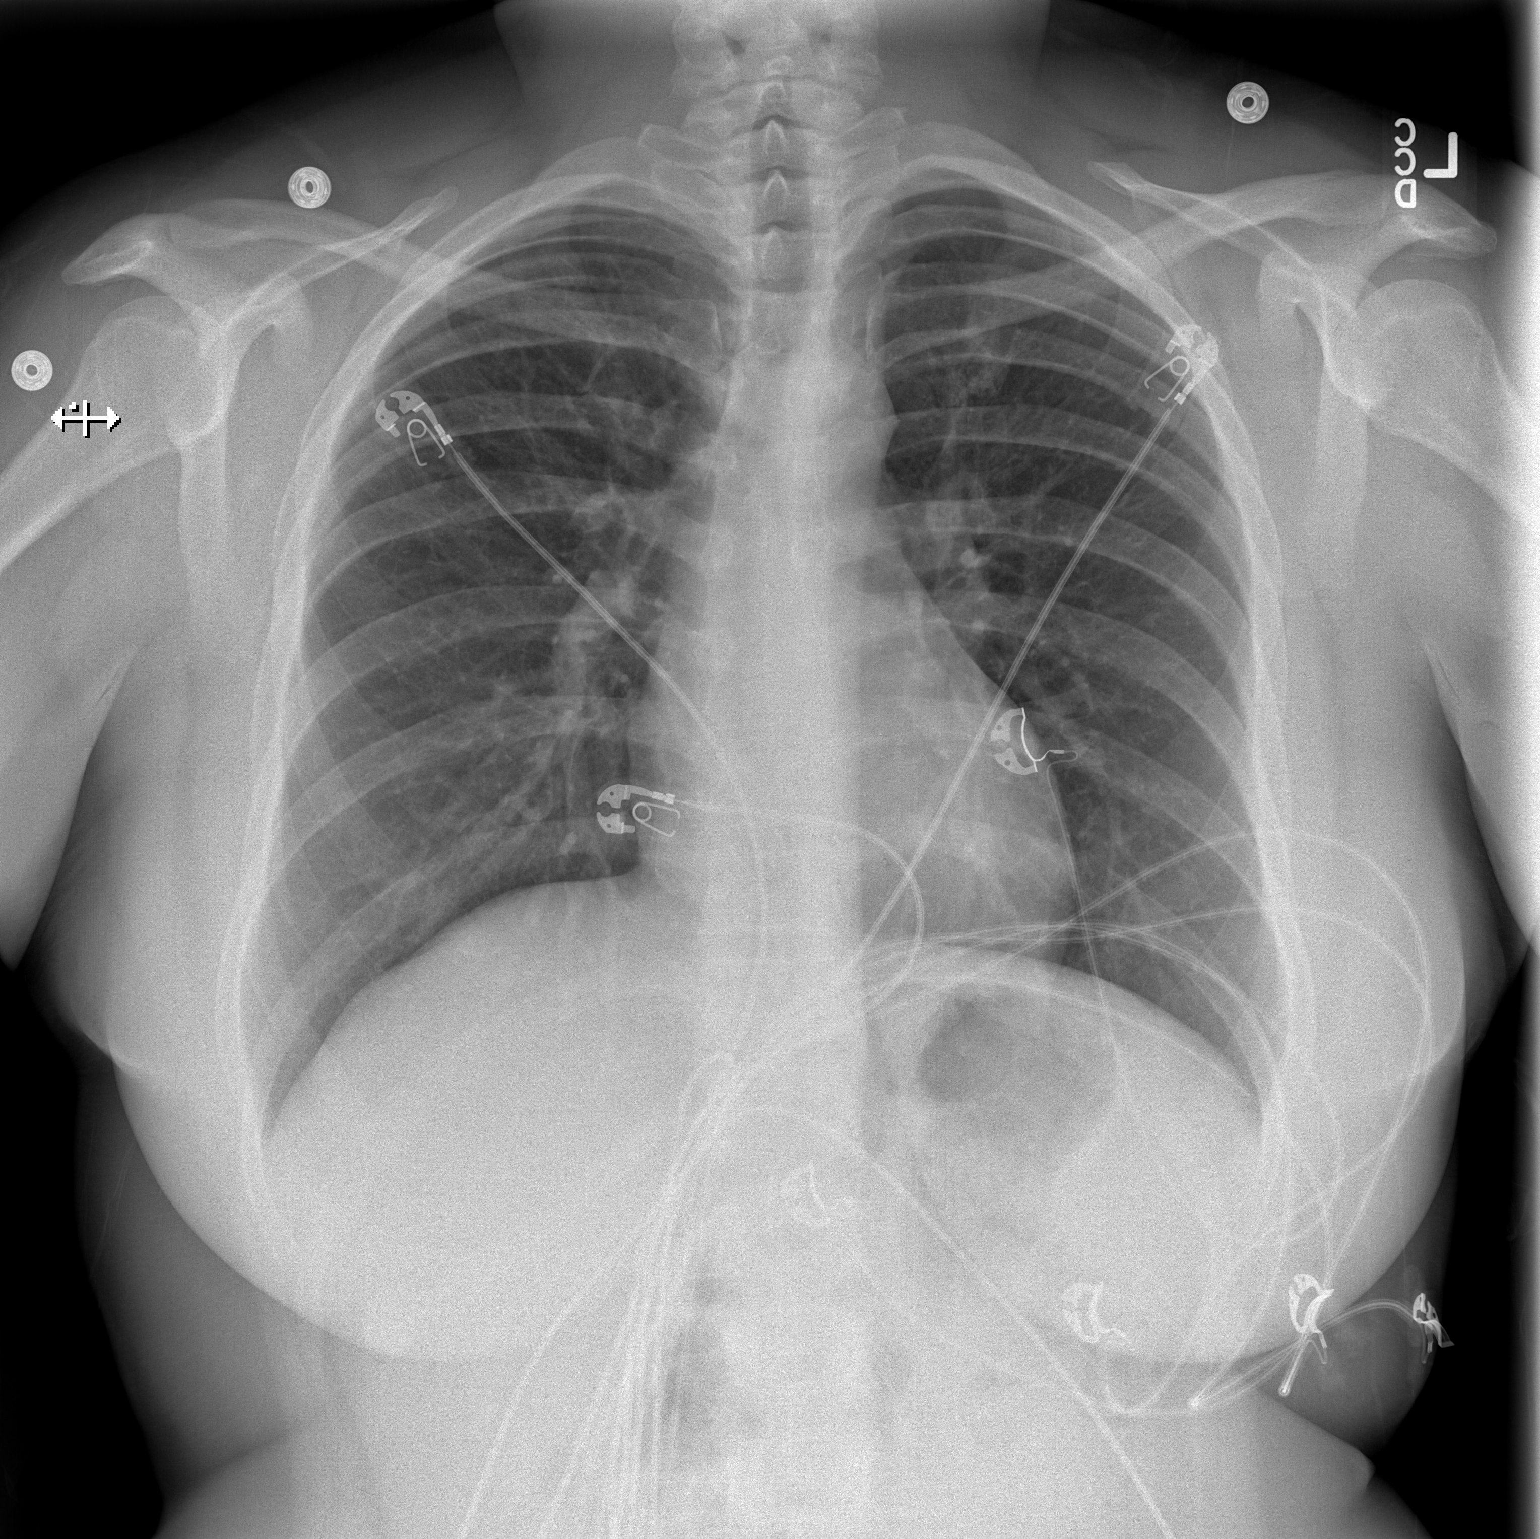

[w chest lat]
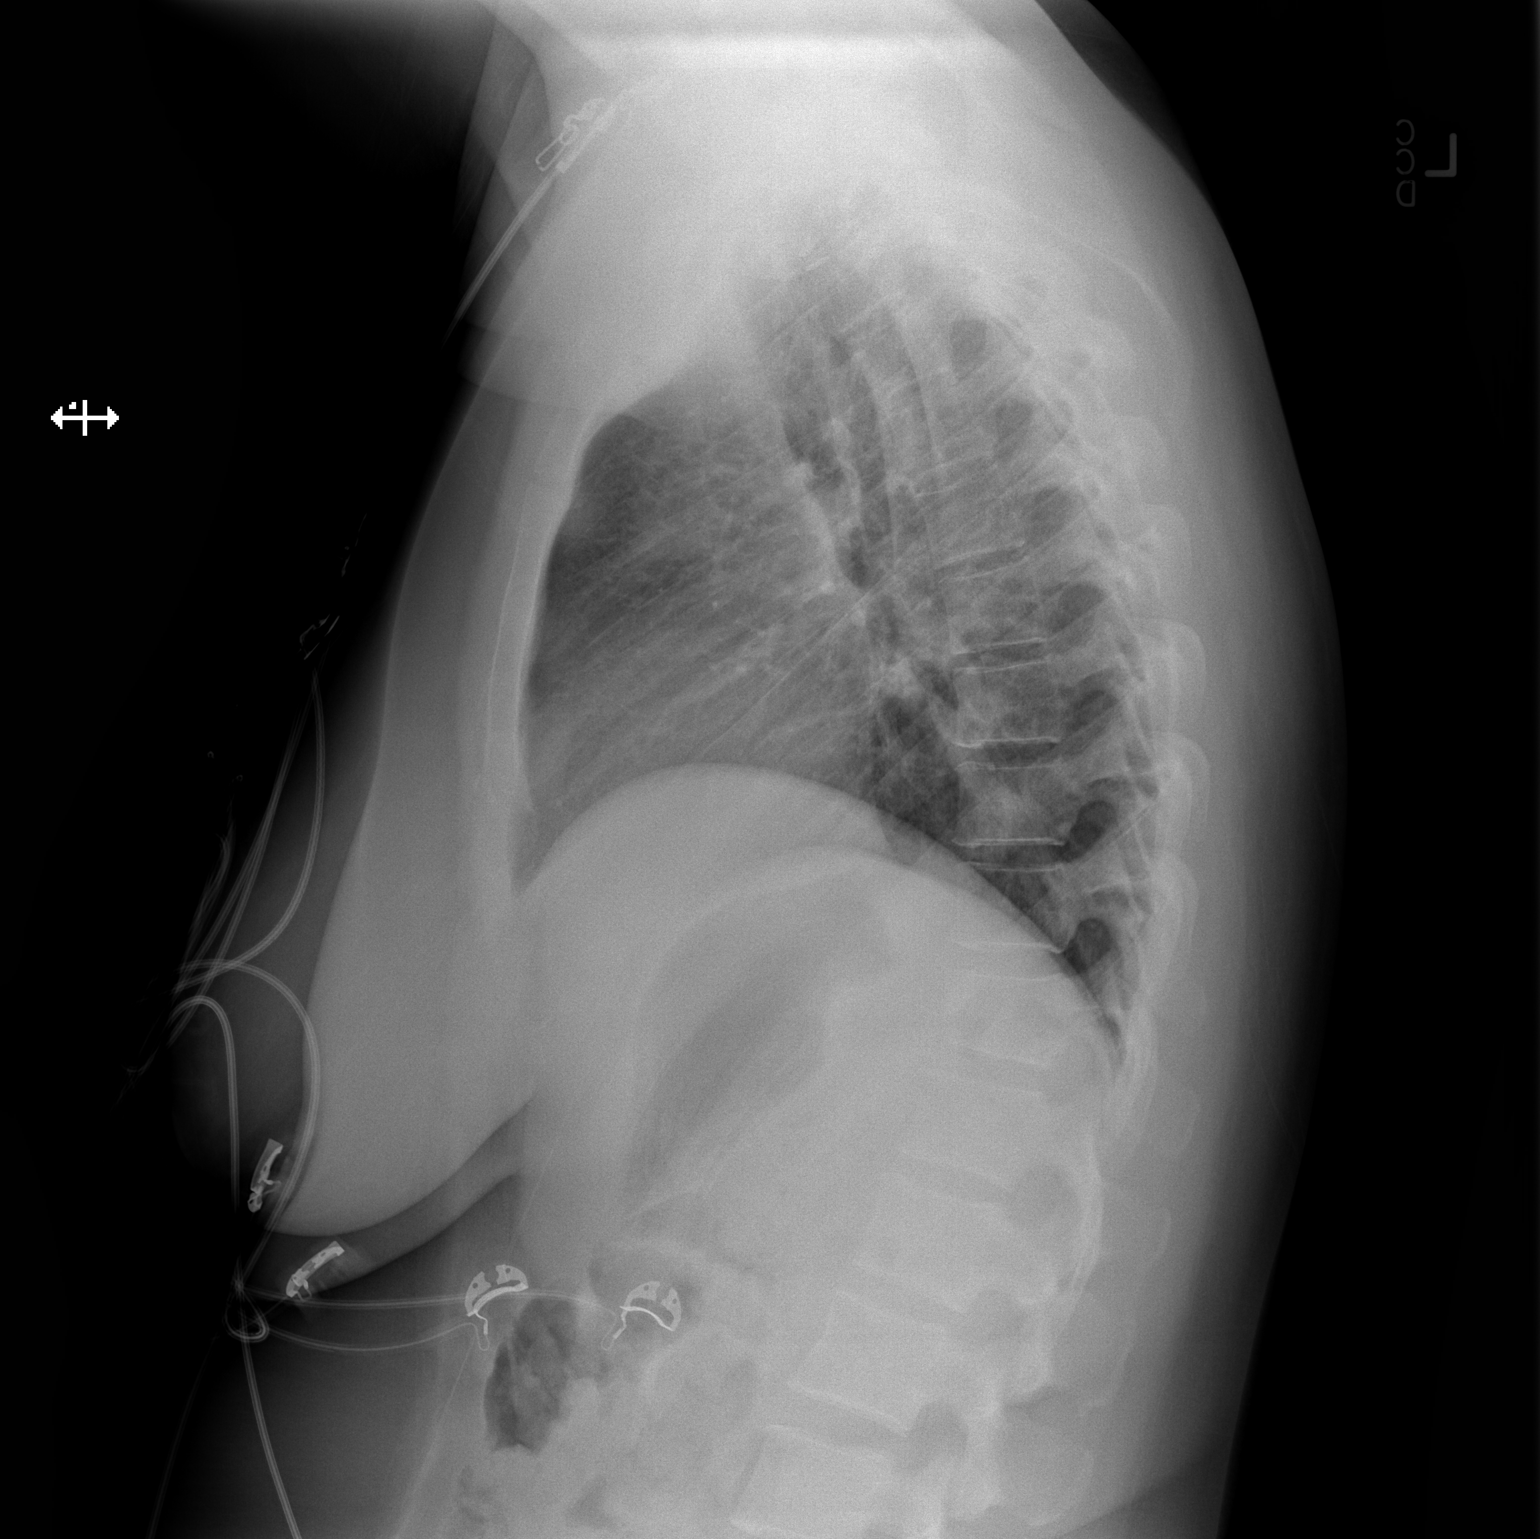

[2 of 2 positions shown; findings below may reference images not displayed]

FINDINGS: Mediastinum and hilar structures normal. Lungs are clear. No pleural
effusion or pneumothorax. Heart size normal. Mild thoracic spine
scoliosis.
IMPRESSION: No acute abnormality.

## 2018-04-15 NOTE — Discharge Instructions (Addendum)
Your symptoms are most likely from a virus infection. Your chest x-ray is negative.  A frequent cough can cause inflammation and strain of your chest wall and ribs.  A viral illness typically peaks on day 2-3 and resolves after one week.    The main treatment approach for a viral upper respiratory infection is to treat the symptoms, support your immune system and prevent spread of illness.   Stay well-hydrated. Rest. You can use over the counter medications to help with symptoms: 600 mg ibuprofen (motrin, aleve, advil) or acetaminophen (tylenol) every 6 hours, around the clock to help with associated fevers, sore throat, headaches, generalized body aches and malaise.  Oxymetazoline (afrin) intranasal spray once daily for no more than 3 days to help with congestion, after 3 days you can switch to another over-the-counter nasal steroid spray such as fluticasone (flonase) Allergy medication (ex: loratadine, cetirizine, etc) and phenylephrine (sudafed) help with nasal congestion, runny nose and postnasal drip.   Dextromethorphan (Delsym) to suppress cough without mucus Guaifenesin (mucinex) to help with built up mucus in chest and productive cough Wash your hands often to prevent spread.   A viral upper respiratory infection can also worsen and progress into pneumonia.  Monitor your symptoms. Return to ER if your symptoms worsen, persist and you develop persistent fevers, chest pain or shortness of breath with exertion, worsening productive cough

## 2018-04-15 NOTE — ED Triage Notes (Addendum)
Patient c/o intermittent mid chest pain x 2 days,but states today it has been more constant. Patient c/o nausea and dizziness yesterday. Patient also states, "I want to cough,but can't."

## 2018-04-15 NOTE — ED Provider Notes (Signed)
[facility]-EMERGENCY DEPT Provider Note   CSN: [id number] Arrival date & time: 04/15/18  0732     History   Chief Complaint Chief Complaint  Patient presents with  . Chest Pain    HPI Angie Roberts is a 37 y.o. female with history of hypertension, allergies is here for evaluation of chest "tightness".  States "I feel like I have a cold in my chest".  Onset 2 days ago.  The pain is constant, down the sides of the sternum, nonradiating.  Pain is intermittently worse when there is palpation, coughing and when she is around a year.  States when she goes outside her chest gets tight.  She stood under a fan and felt like the air was making her chest tightness worse.  Reports on Sunday she developed cold-like symptoms including congestion, cough, generalized malaise, intermittent headaches.  Also had diarrhea for about 24 hours and abdominal cramping which have resolved.  The chest pain is nonexertional, nonpleuritic but states when she moves around and does chores at home she feels like she is going to choke in her throat from the mucus that she cannot cough up.  She has taken over-the-counter cold medicines including Claritin and a cough suppressant without difficulty.  She placed warm towels on her chest and this helped.  She denies any fevers, chills, sore throat, exertional chest pain or shortness of breath, abdominal pain, nausea, vomiting, diarrhea.  She traveled to [place] by plane last month 5 hr flight. No h/o DVT/PE, estrogen use.  Pt has a physical this week.  HPI  Past Medical History:  Diagnosis Date  . Allergy   . Anxiety   . Depression   . Hypertension     Patient Active Problem List   Diagnosis Date Noted  . History of iron deficiency anemia 11/02/2017  . Essential hypertension 12/03/2016  . Healthcare maintenance 11/17/2016  . Dysuria 11/17/2016    History reviewed. No pertinent surgical history.   OB History   None      Home  Medications    Prior to Admission medications   Medication Sig Start Date End Date Taking? Authorizing Provider  dextromethorphan (COUGH DM) 30 MG/5ML liquid Take 60 mg by mouth every 12 (twelve) hours as needed for cough.   Yes [provider]  fluticasone (FLONASE) 50 MCG/ACT nasal spray Place 1 spray into both nostrils daily as needed for allergies or rhinitis.   Yes [provider]  hydrochlorothiazide (HYDRODIURIL) 25 MG tablet Take 25 mg by mouth 2 (two) times daily.   Yes [provider]  ibuprofen (ADVIL,MOTRIN) 800 MG tablet Take 800 mg by mouth 3 (three) times daily as needed for mild pain.   Yes [provider]  loratadine (CLARITIN) 10 MG tablet Take 10 mg by mouth daily.   Yes [provider]    Family History Family History  Problem Relation Age of Onset  . Diabetes Mother   . Hyperlipidemia Mother   . Hypertension Mother   . Intellectual disability Mother   . Diabetes Father   . Hyperlipidemia Father   . Hypertension Father   . Intellectual disability Father   . Diabetes Sister   . Intellectual disability Maternal Grandmother   . Cancer Maternal Grandfather   . Stroke Maternal Grandfather   . Diabetes Maternal Grandfather   . Intellectual disability Paternal Grandmother   . Heart disease Paternal Grandmother   . Cancer Paternal Grandmother   . Heart disease Paternal Grandfather   .  Hyperlipidemia Sister     Social History Social History   Tobacco Use  . Smoking status: Never Smoker  . Smokeless tobacco: Never Used  Substance Use Topics  . Alcohol use: Not Currently    Frequency: Never  . Drug use: Never     Allergies   Patient has no known allergies.   Review of Systems Review of Systems  HENT: Positive for congestion, postnasal drip and rhinorrhea.   Respiratory: Positive for cough.   Cardiovascular: Positive for chest pain.  All other systems reviewed and are negative.    Physical Exam Updated  Vital Signs BP (!) 153/94 (BP Location: Left Arm)   Pulse 78   Temp 98 F (36.7 C) (Oral)   Resp 18   Ht 5\' 4"  (1.626 m)   Wt 89.4 kg   LMP 04/01/2018 (Approximate)   SpO2 98%   BMI 33.81 kg/m   Physical Exam  Constitutional: She appears well-developed and well-nourished.  NAD. Non toxic.   HENT:  Head: Normocephalic and atraumatic.  Nose: Mucosal edema present.  Eyes: Conjunctivae, EOM and lids are normal.  Neck: Trachea normal and normal range of motion.  Trachea midline.   Cardiovascular: Normal rate, regular rhythm, S1 normal, S2 normal and normal heart sounds.  Pulses:      Radial pulses are 2+ on the right side, and 2+ on the left side.       Dorsalis pedis pulses are 2+ on the right side, and 2+ on the left side.  No LE edema or calf tenderness.   Pulmonary/Chest: Effort normal and breath sounds normal. She exhibits tenderness.  Tenderness to lateral edges of the sternum.    Abdominal: Soft. Bowel sounds are normal. There is no tenderness.  No epigastric tenderness. No distention.   Neurological: She is alert. GCS eye subscore is 4. GCS verbal subscore is 5. GCS motor subscore is 6.  Skin: Skin is warm and dry. Capillary refill takes less than 2 seconds.  No rash to chest wall  Psychiatric: Her speech is normal and behavior is normal. Thought content normal. Cognition and memory are normal.     ED Treatments / Results  Labs (all labs ordered are listed, but only abnormal results are displayed) Labs Reviewed  BASIC METABOLIC PANEL - Abnormal; Notable for the following components:      Result Value   Glucose, Bld 104 (*)    All other components within normal limits  CBC - Abnormal; Notable for the following components:   WBC 11.1 (*)    RBC 5.87 (*)    MCV 70.2 (*)    MCH 22.0 (*)    RDW 15.7 (*)    All other components within normal limits  I-STAT TROPONIN, ED  I-STAT BETA HCG BLOOD, ED ([facility], [facility], [facility] ONLY)  I-STAT TROPONIN, ED    EKG EKG  Interpretation  Date/Time:  Friday April 15 2018 07:44:59 EST Ventricular Rate:  78 PR Interval:    QRS Duration: 96 QT Interval:  388 QTC Calculation: 442 R Axis:   29 Text Interpretation:  Sinus rhythm since last tracing no significant change Confirmed by Mancel Bale [phone]) on 04/15/2018 8:40:45 AM   Radiology Dg Chest 2 View  Result Date: 04/15/2018 CLINICAL DATA:  Chest pain.  Shortness of breath. EXAM: CHEST - 2 VIEW COMPARISON:  05/01/2008. FINDINGS: Mediastinum and hilar structures normal. Lungs are clear. No pleural effusion or pneumothorax. Heart size normal. Mild thoracic spine scoliosis. IMPRESSION: No acute abnormality. Electronically Signed  ByMaisie Fus: Thomas  Register   On: 04/15/2018 08:02    Procedures Procedures (including critical care time)  Medications Ordered in ED Medications - No data to display   Initial Impression / Assessment and Plan / ED Course  I have reviewed the triage vital signs and the nursing notes.  Pertinent labs & imaging results that were available during my care of the patient were reviewed by me and considered in my medical decision making (see chart for details).  Clinical Course as of Apr 16 843  [facility] WBC(!): 11.1 [CG]    Clinical Course User Index [CG] Liberty HandyGibbons, Cruise Baumgardner J, PA-C   37 year old here with chest tightness.  Constant for 2 days, worsened with palpation.  This is in setting of likely viral illness including cough, congestion.  Symptoms do not sound cardiac in nature.  Her cardiac risk factors are hypertension, elevated BMI.  [place] but this does not sound like a PE, PERC negative.  Exam is reassuring.  Chest x-ray, EKG, troponin, screening labs obtained at triage reviewed and unremarkable.  Her heart score is low however her pain has been constant, nonexertional and I do not think she needs a delta troponin in 3 hours.  Suspect the chest pain is from frequent coughing versus  costochondritis.  Patient is considered appropriate for discharge with symptomatic management of viral illness and costochondritis.  She has an a scheduled physical with her doctor this week.  Recommended follow-up and further discussion of her symptoms if they are persistent or not improving.  Return precautions given.  Patient is in agreement with this.    Final Clinical Impressions(s) / ED Diagnoses   Final diagnoses:  Viral illness  Costochondritis    ED Discharge Orders    None       Liberty HandyGibbons, Sander Speckman J, PA-C 04/15/18 0844    Mancel BaleWentz, Elliott, MD 04/15/18 2027

## 2018-04-18 LAB — I-STAT TROPONIN, ED: Troponin i, poc: 0 ng/mL (ref 0.00–0.08)

## 2019-02-01 ENCOUNTER — Other Ambulatory Visit: Payer: Self-pay | Admitting: Physician Assistant

## 2019-02-01 DIAGNOSIS — R1011 Right upper quadrant pain: Secondary | ICD-10-CM

## 2019-02-02 ENCOUNTER — Ambulatory Visit
Admission: RE | Admit: 2019-02-02 | Discharge: 2019-02-02 | Disposition: A | Discharge status: Home / Self Care | Payer: Medicaid Other | Source: Ambulatory Visit | Attending: Physician Assistant | Admitting: Physician Assistant

## 2019-02-02 DIAGNOSIS — R1011 Right upper quadrant pain: Secondary | ICD-10-CM

## 2019-02-02 IMAGING — US US ABDOMEN LIMITED
1 series · 14 of 25 positions shown · non-contrast
Comparison: None.

CLINICAL DATA: Right upper quadrant pain

EXAM:
ULTRASOUND ABDOMEN LIMITED RIGHT UPPER QUADRANT

[Series 1: us abdomen limited · 0.23mm/px · 14 of 40 slices shown]
[im 1/40]
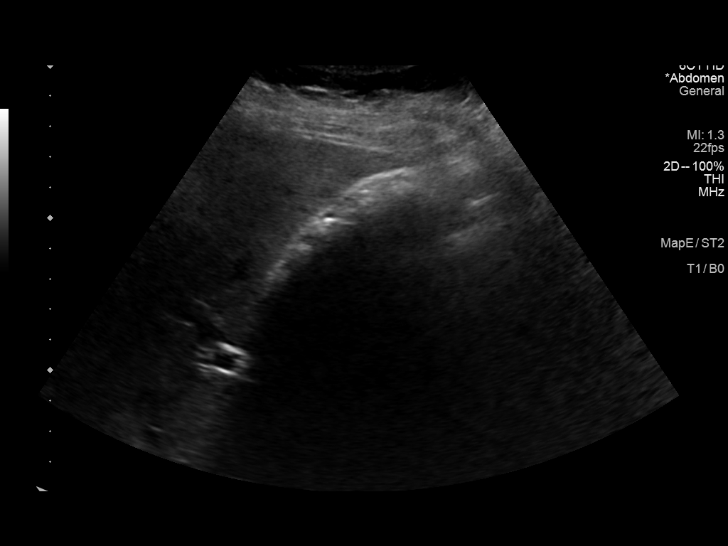
[im 4/40]
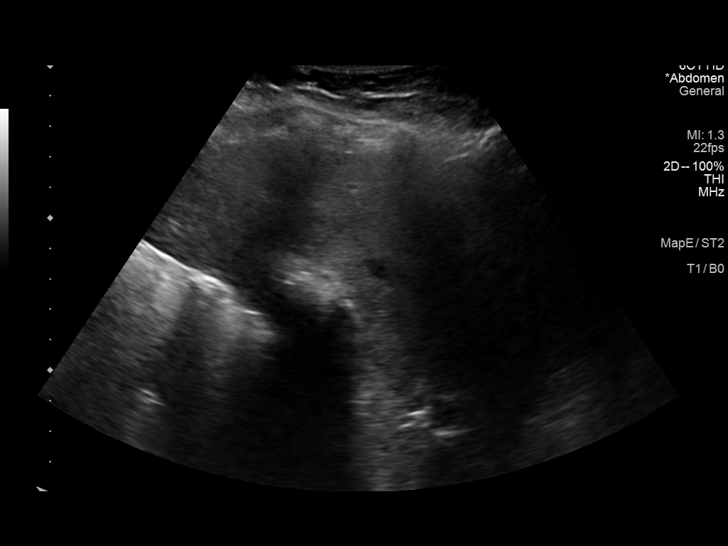
[im 7/40]
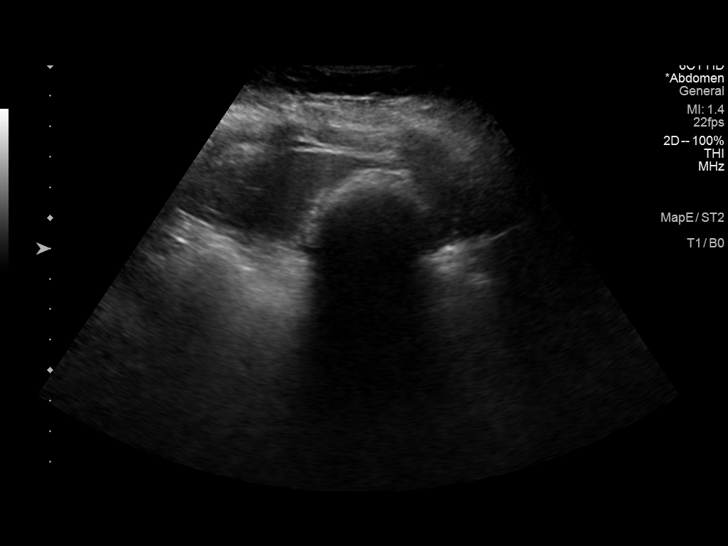
[im 10/40]
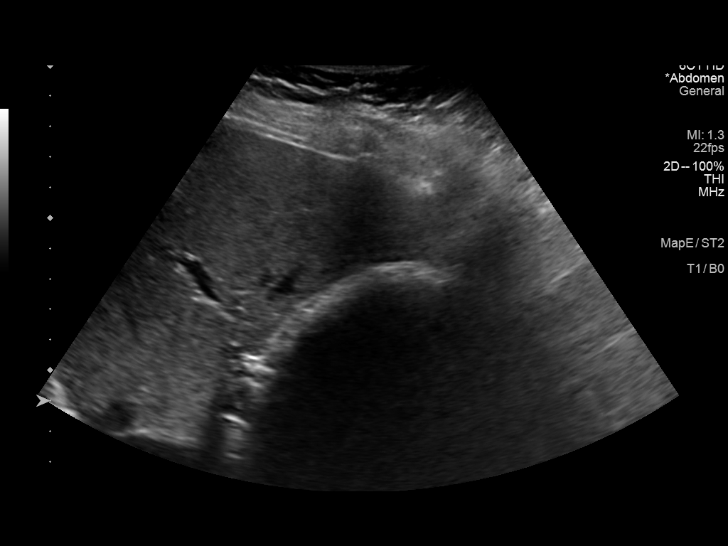
[im 14/40]
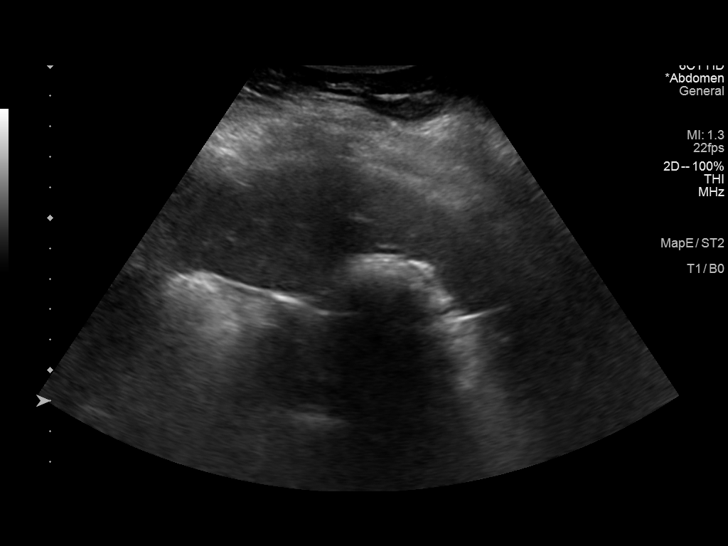
[im 15/40]
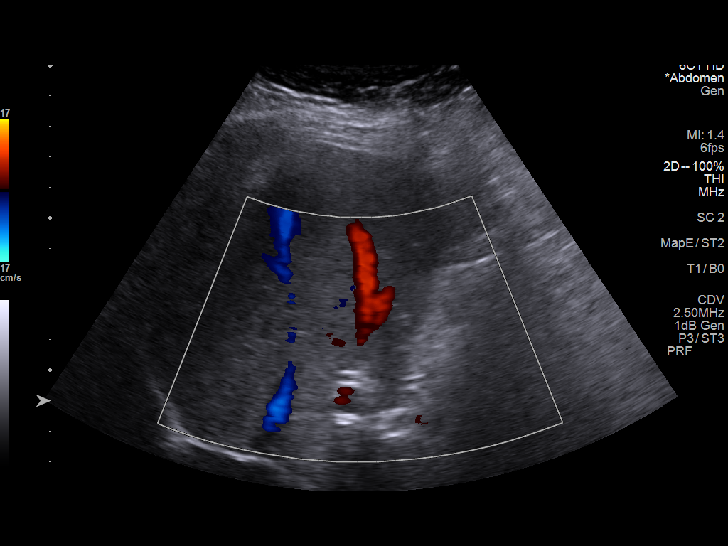
[im 18/40]
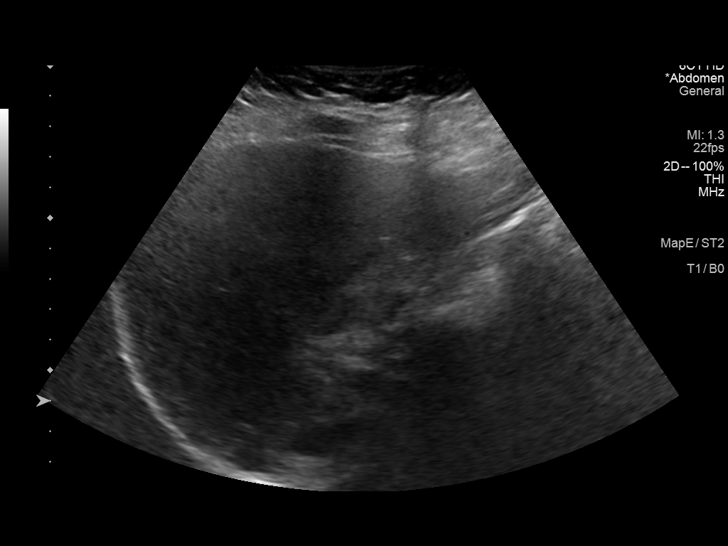
[im 22/40]
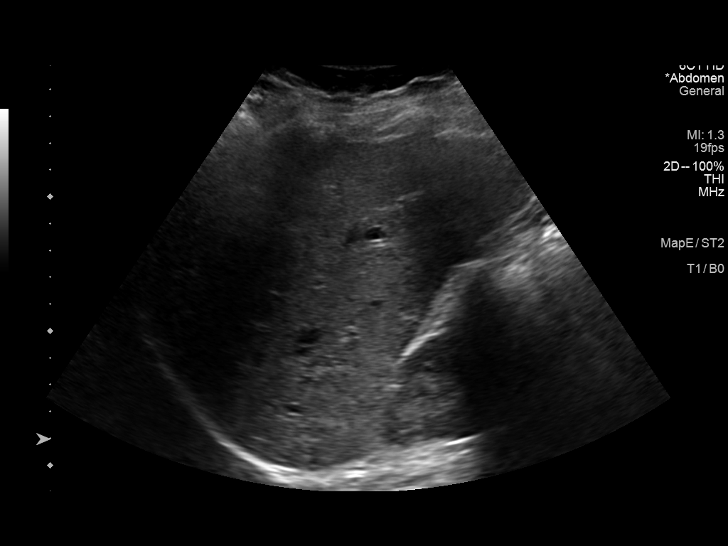
[im 25/40]
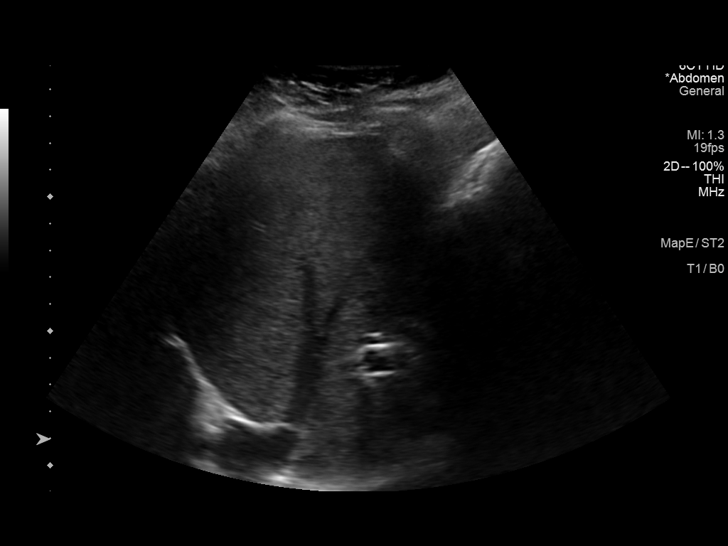
[im 27/40]
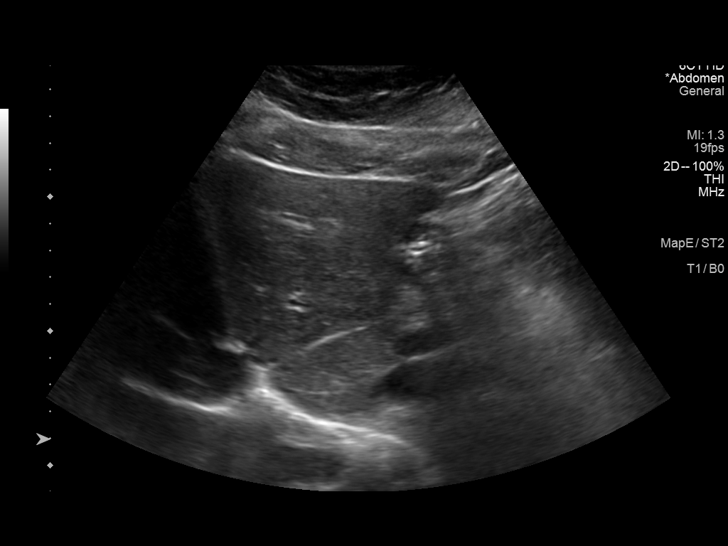
[im 30/40]
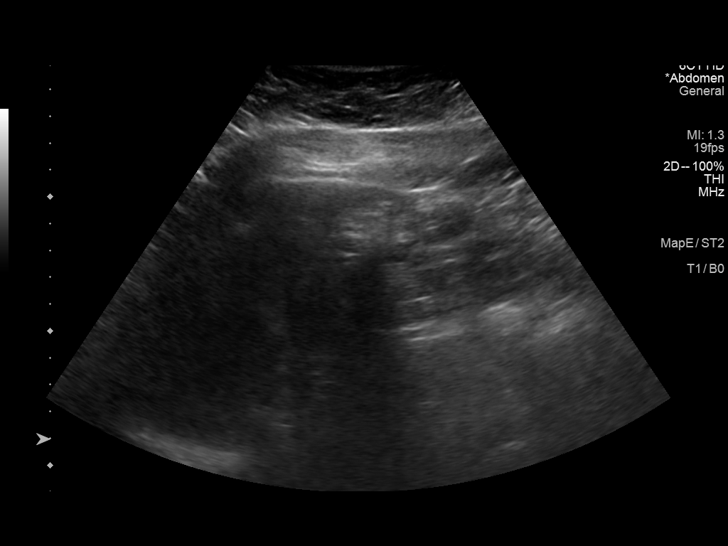
[im 33/40]
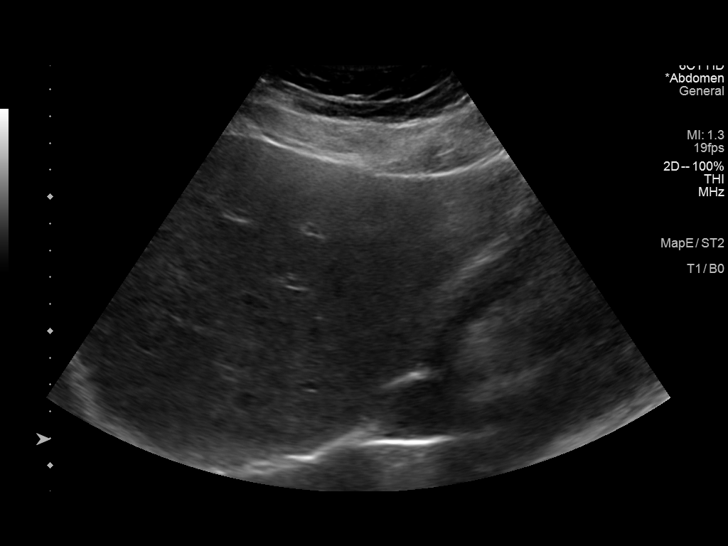
[im 36/40]
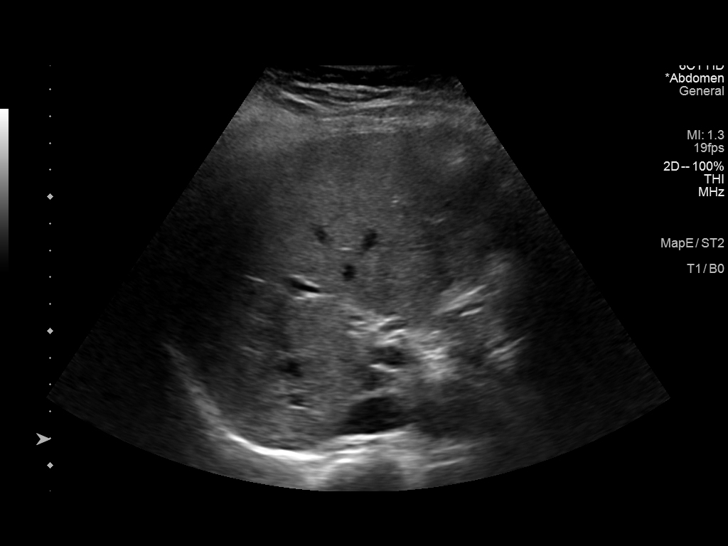
[im 40/40]
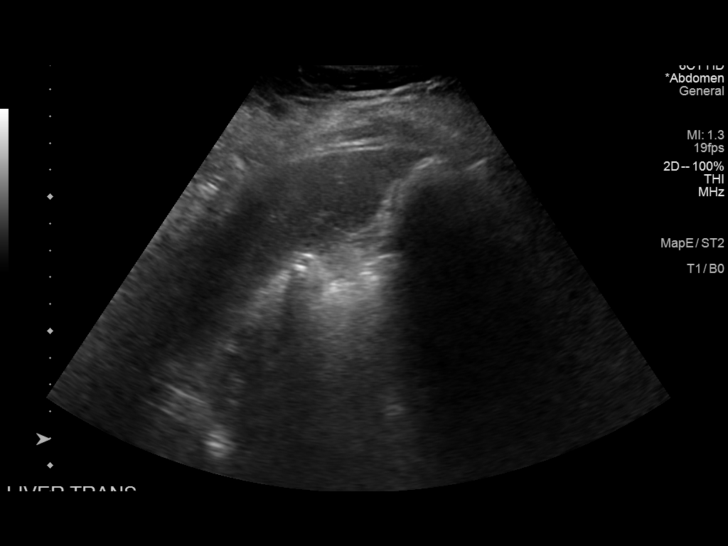

[14 of 25 positions shown; findings below may reference images not displayed]

FINDINGS: Gallbladder:

Gallbladder is contracted and filled with calculi. There is no
pericholecystic fluid. No sonographic Murphy sign noted by
sonographer.

Common bile duct:

Diameter: 2 mm. No intrahepatic or extrahepatic biliary duct
dilatation.

Liver:

No focal lesion identified. Within normal limits in parenchymal
echogenicity. Portal vein is patent on color Doppler imaging with
normal direction of blood flow towards the liver.

Other: None.
IMPRESSION: Gallbladder contracted and filled with calculi.

Study otherwise unremarkable.

## 2019-02-04 ENCOUNTER — Other Ambulatory Visit: Payer: Self-pay

## 2019-02-04 DIAGNOSIS — Z20822 Contact with and (suspected) exposure to covid-19: Secondary | ICD-10-CM

## 2019-02-05 LAB — NOVEL CORONAVIRUS, NAA: SARS-CoV-2, NAA: NOT DETECTED

## 2019-09-28 ENCOUNTER — Other Ambulatory Visit: Payer: Self-pay | Admitting: Family Medicine

## 2019-09-28 DIAGNOSIS — N6452 Nipple discharge: Secondary | ICD-10-CM

## 2019-10-13 ENCOUNTER — Other Ambulatory Visit: Payer: Medicaid Other

## 2019-10-24 ENCOUNTER — Ambulatory Visit: Payer: Medicaid Other

## 2019-10-24 ENCOUNTER — Other Ambulatory Visit: Payer: Self-pay

## 2019-10-24 ENCOUNTER — Ambulatory Visit
Admission: RE | Admit: 2019-10-24 | Discharge: 2019-10-24 | Disposition: A | Discharge status: Home / Self Care | Payer: 59 | Source: Ambulatory Visit | Attending: Family Medicine | Admitting: Family Medicine

## 2019-10-24 DIAGNOSIS — N6452 Nipple discharge: Secondary | ICD-10-CM

## 2019-10-24 IMAGING — MG DIGITAL DIAGNOSTIC BILAT W/ TOMO W/ CAD
6 of 12 series · 6 of 36 positions shown · non-contrast
Comparison: Previous exam(s).

CLINICAL DATA: Patient describes bilateral clear nipple discharge.
Patient states that this nipple discharge and the associated breast
pain/sensitivity has been seen present for multiple years.

[L CC synth-2D]
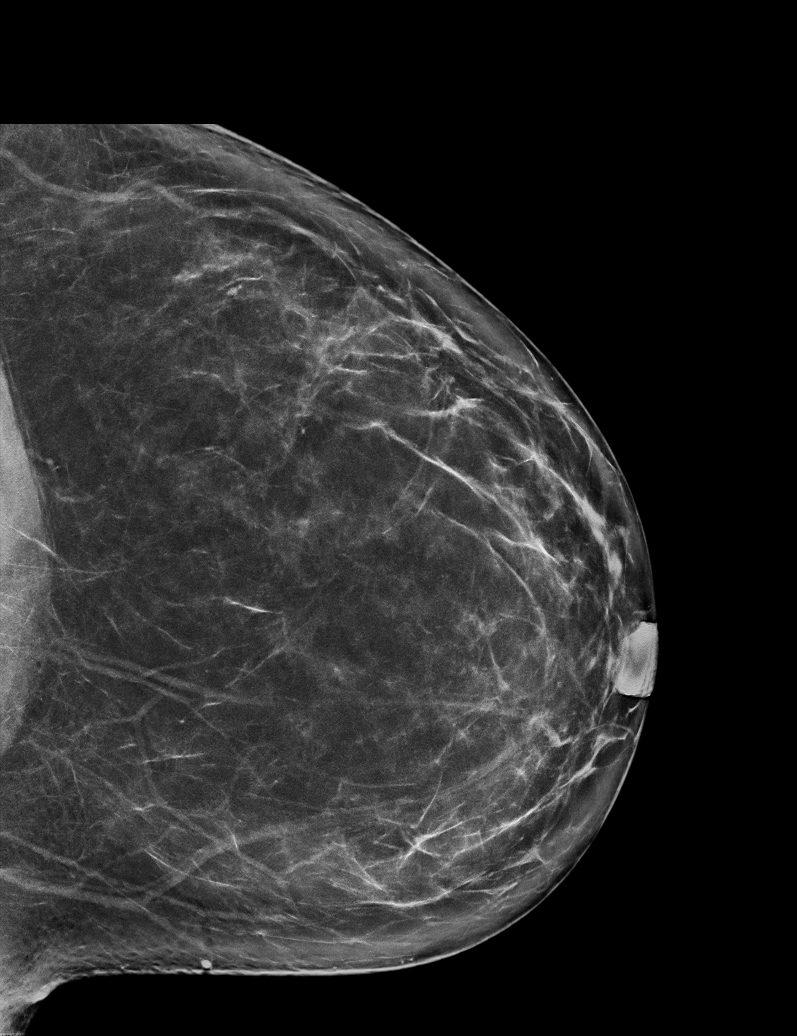

[R MLO synth-2D (1 of 2)]
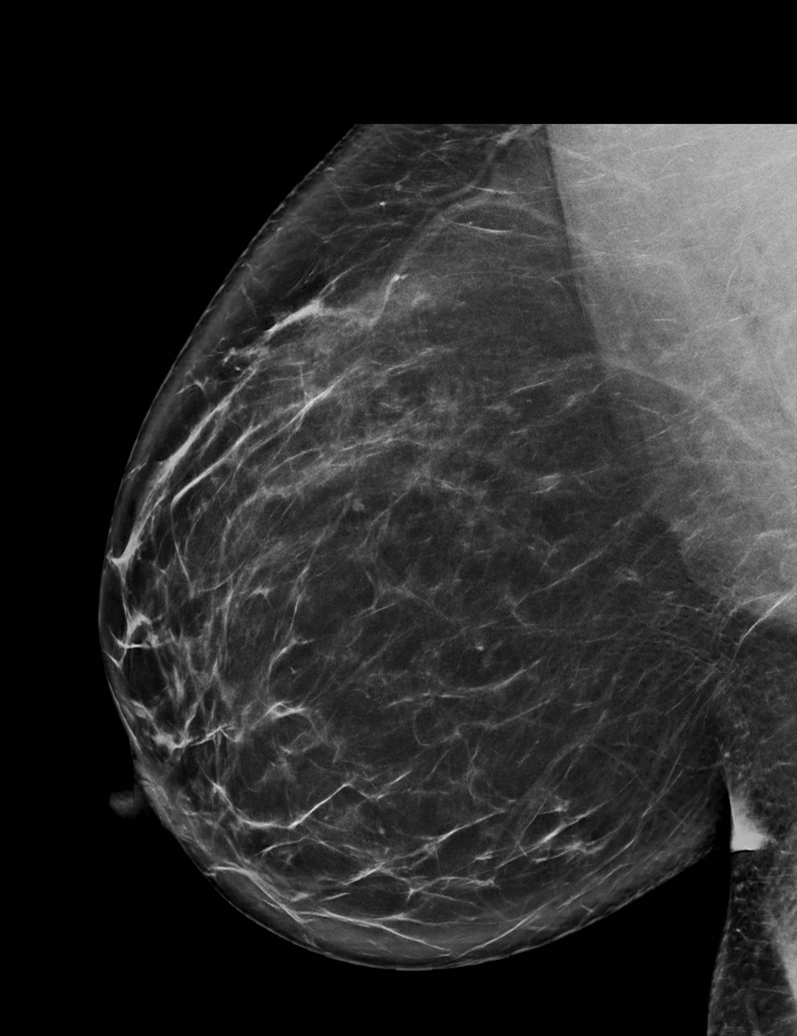

[L MLO synth-2D (1 of 2)]
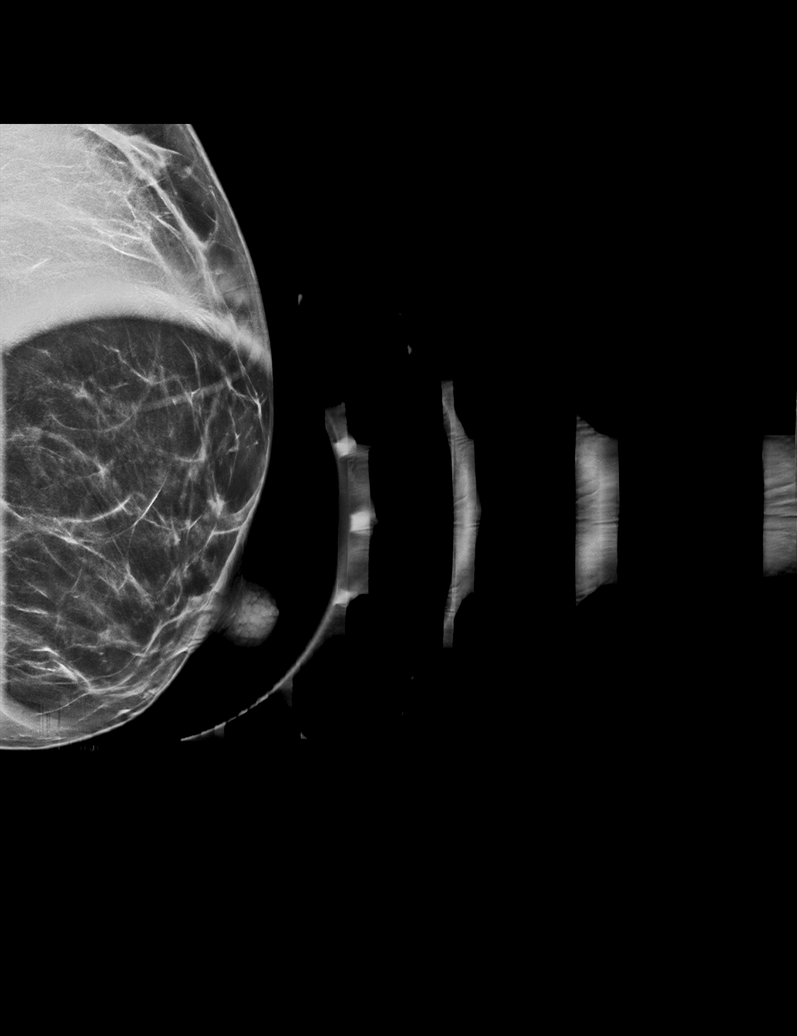

[R MLO synth-2D (2 of 2)]
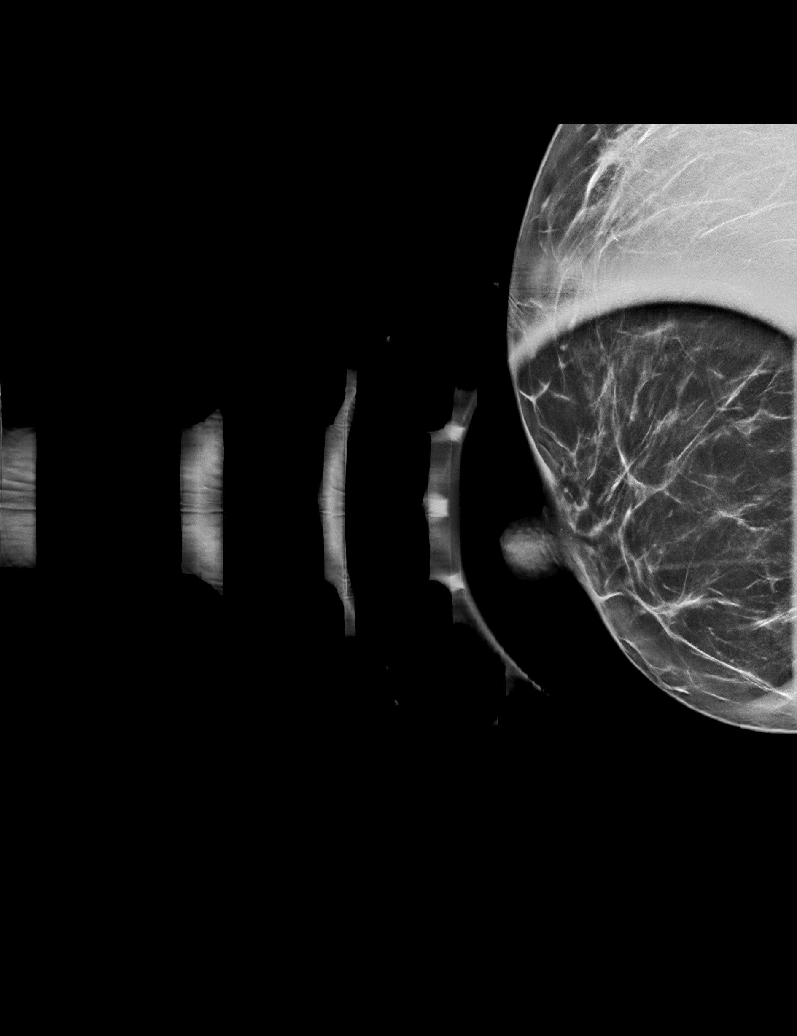

[R CC synth-2D]
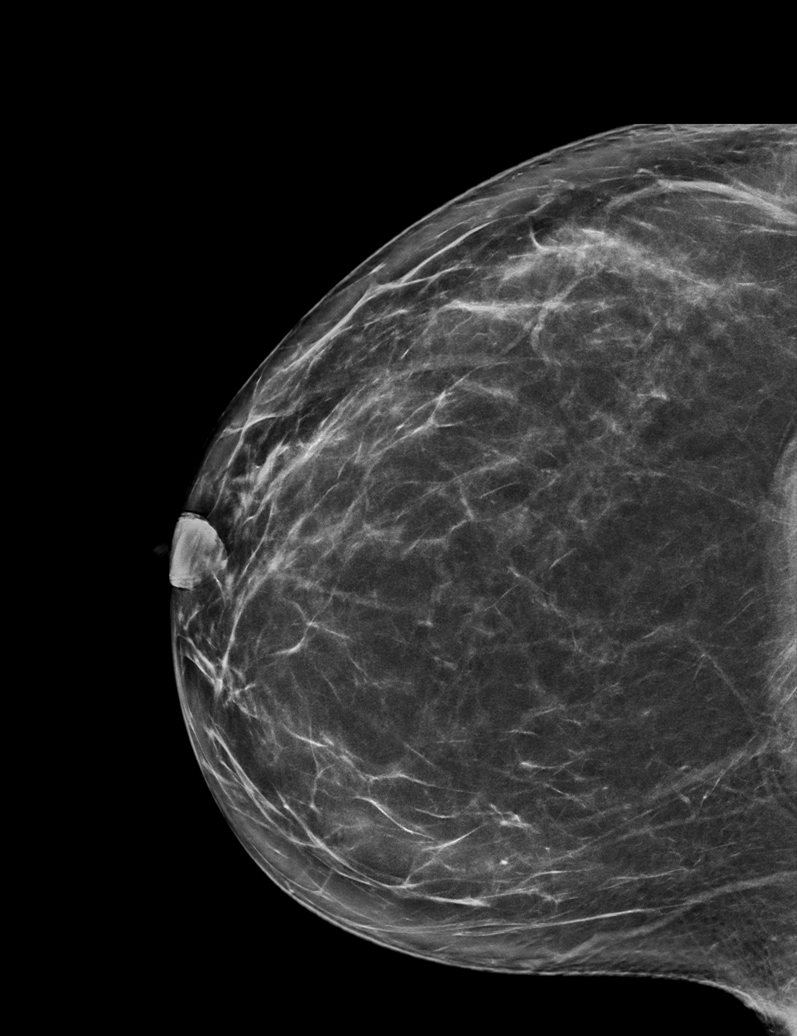

[L MLO synth-2D (2 of 2)]
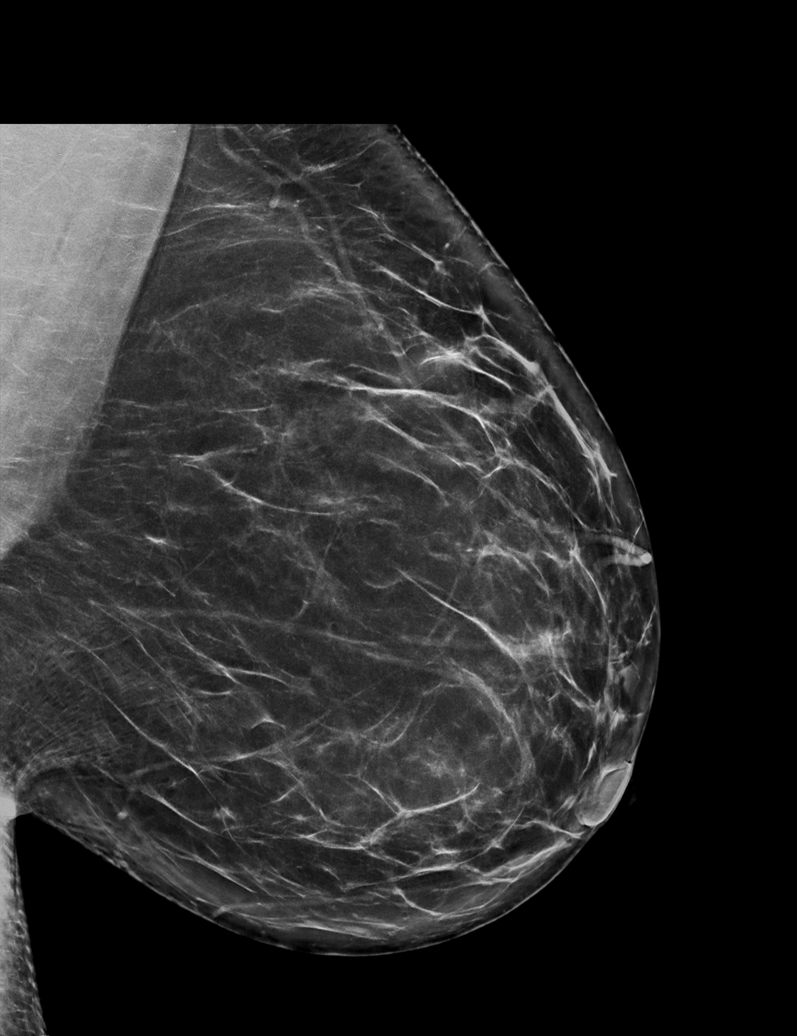

[6 of 36 positions shown; findings below may reference images not displayed]

Patient initially described a history of only non-spontaneous nipple
discharge but later in the conversation described possible
spontaneous discharge and possible non-clear (darker) discharge.
Patient describes the breast pain as continuous but worsened with
menstrual cycles.

EXAM:
DIGITAL DIAGNOSTIC BILATERAL MAMMOGRAM WITH CAD AND TOMO
ACR Breast Density Category b: There are scattered areas of
fibroglandular density.
FINDINGS: Bilateral diagnostic mammograms were obtained today, with 3D
tomosynthesis and with spot compression views of the subareolar
breasts bilaterally corresponding to the areas of clinical concern.
There are no masses, suspicious calcifications or secondary signs of
malignancy within either breast.

Mammographic images were processed with CAD.
IMPRESSION: No evidence of malignancy within either breast.

RECOMMENDATION:
1. Breast MRI with contrast for further characterization of
patient's bilateral nipple discharge. Bilateral non-spontaneous
nipple discharge would not be concerning for breast cancer, however,
patient described possible spontaneous and non-clear (dark) nipple
discharge which raises the possibility of occult breast cancer or
papilloma as a cause for discharge.

Recommendation for breast MRI based on data provided by the [HOSPITAL] Appropriateness Criteria, produced by an expert
panel on breast imaging, showing breast MRI with contrast to have a
high sensitivity and NPV for identifying the cause of pathologic
nipple discharge.

2. Breast surgery consultation may be eventually required as central
ductal excision might become necessary for definitive diagnosis
and/or definitive therapy. This possibility was discussed with the
patient and the ordering physician today.

3. Alternative therapies such as topical anti-inflammatory
medications were also discussed with the patient today and could be
considered if breast MRI were negative.

These results and recommendations were called by telephone at the
time of interpretation on [DATE] at [DATE] to provider PAGA
, who verbally acknowledged these results.

I have discussed the findings and recommendations with the patient.
If applicable, a reminder letter will be sent to the patient
regarding the next appointment.

BI-RADS CATEGORY  1: Negative.

## 2019-10-31 ENCOUNTER — Other Ambulatory Visit: Payer: Self-pay | Admitting: Family Medicine

## 2019-11-06 ENCOUNTER — Other Ambulatory Visit: Payer: Self-pay | Admitting: Family Medicine

## 2019-11-06 DIAGNOSIS — N6452 Nipple discharge: Secondary | ICD-10-CM

## 2019-11-17 ENCOUNTER — Ambulatory Visit
Admission: RE | Admit: 2019-11-17 | Discharge: 2019-11-17 | Disposition: A | Discharge status: Home / Self Care | Payer: 59 | Source: Ambulatory Visit | Attending: Family Medicine | Admitting: Family Medicine

## 2019-11-17 ENCOUNTER — Other Ambulatory Visit: Payer: Self-pay

## 2019-11-17 DIAGNOSIS — N6452 Nipple discharge: Secondary | ICD-10-CM

## 2019-11-17 IMAGING — MR MR BREAST BILAT WO/W CM
8 of 12 series · 30 of 48 positions shown · IV contrast (gadavist)
Comparison: Recent mammography

CLINICAL DATA: Bilateral nipple discharge.

LABS:  None
EXAM:
BILATERAL BREAST MRI WITH AND WITHOUT CONTRAST
TECHNIQUE: Multiplanar, multisequence MR images of both breasts were obtained
prior to and following the intravenous administration of 10 ml of
Gadavist

[Series 2: t2_tirm_tra ipat (a-p) · axial · 3.0mm · 0.70mm/px · 1 of 60 slices shown]
[im 1/60]
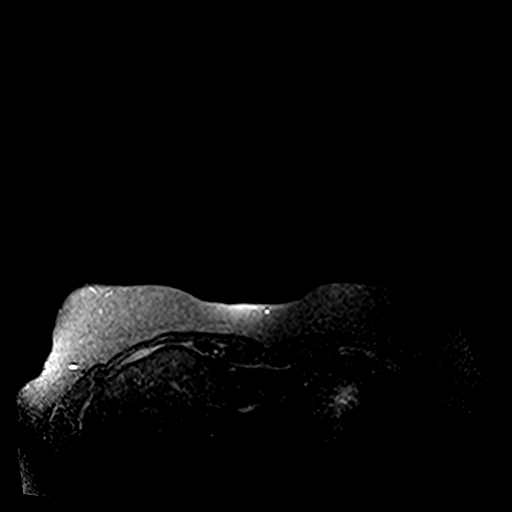

[Series 3: fl3d pre-cm no · axial · non-contrast · 0.9mm · 0.94mm/px · z∈[-54,+118]mm · 5 of 192 slices shown]
[im 1/192]
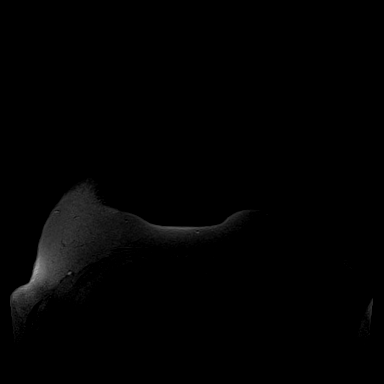
[im 48/192]
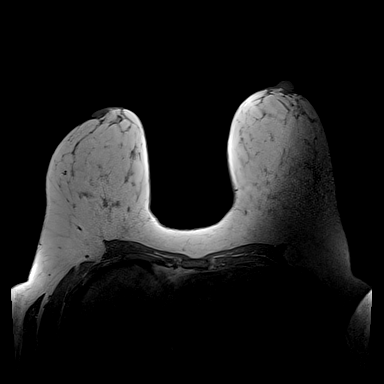
[im 96/192]
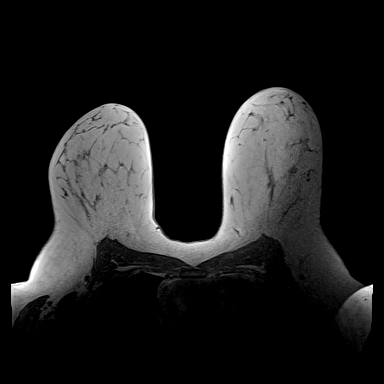
[im 144/192]
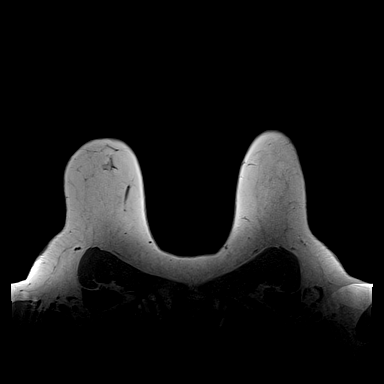
[im 192/192]
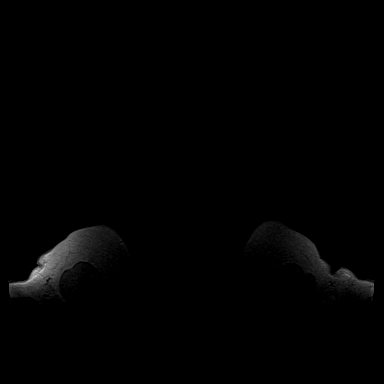

[Series 4: fl3d pre-cm · axial · non-contrast · 0.9mm · 0.87mm/px · z∈[-54,+118]mm · 5 of 192 slices shown]
[im 1/192]
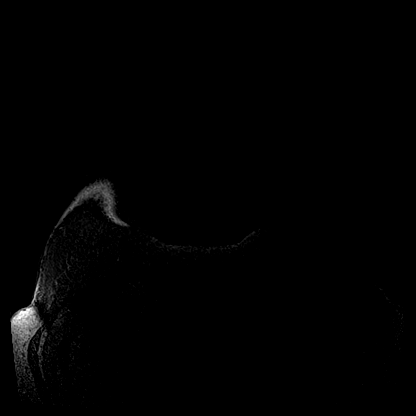
[im 48/192]
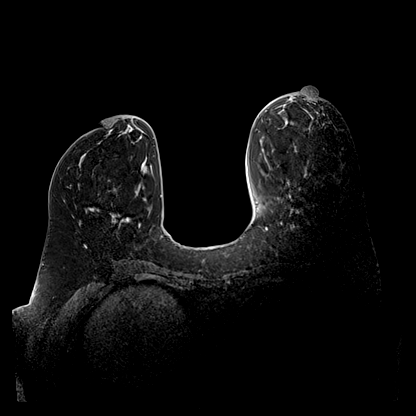
[im 96/192]
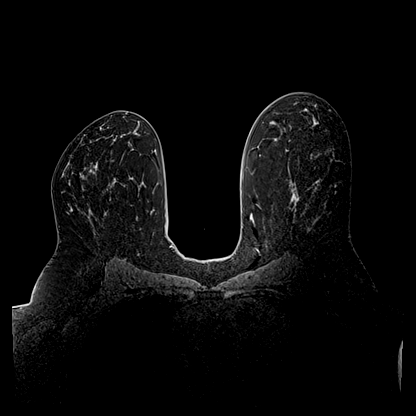
[im 144/192]
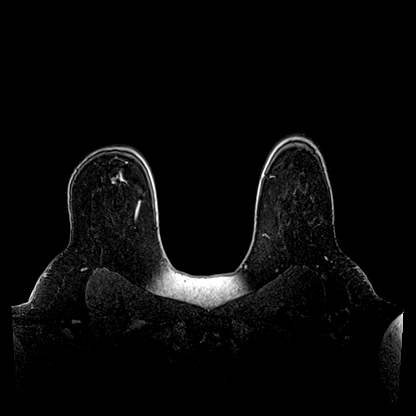
[im 192/192]
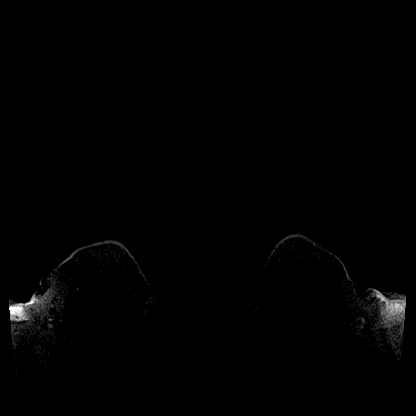

[Series 5: fl3d post-cm 20 · axial · 0.9mm · 0.87mm/px · z∈[-54,+118]mm · 5 of 192 slices shown (1 of 3)]
[im 1/192]
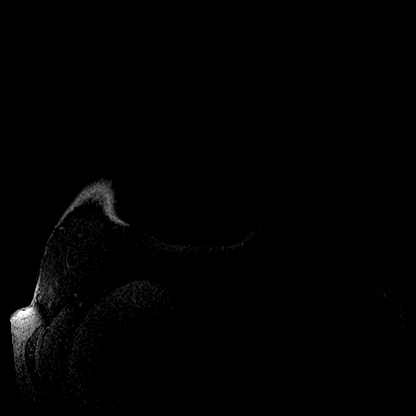
[im 48/192]
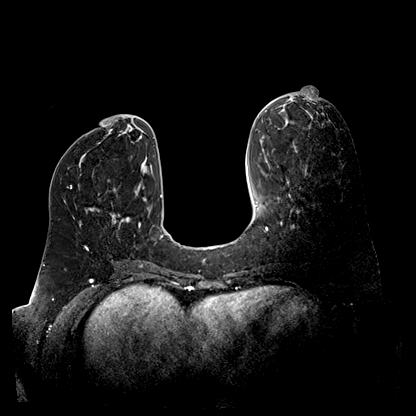
[im 96/192]
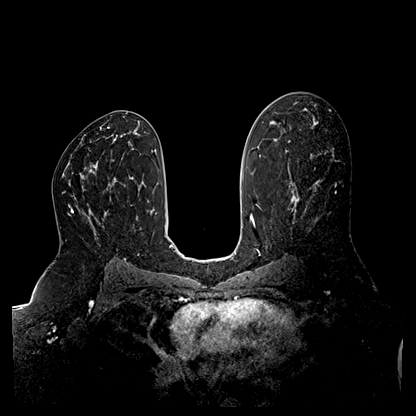
[im 144/192]
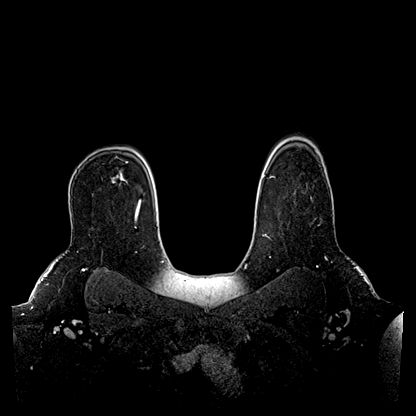
[im 192/192]
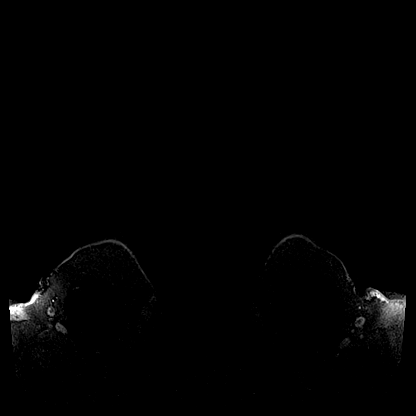

[Series 6: fl3d post-cm 20 · axial · 0.9mm · 0.87mm/px · z∈[-54,+118]mm · 5 of 192 slices shown (2 of 3)]
[im 1/192]
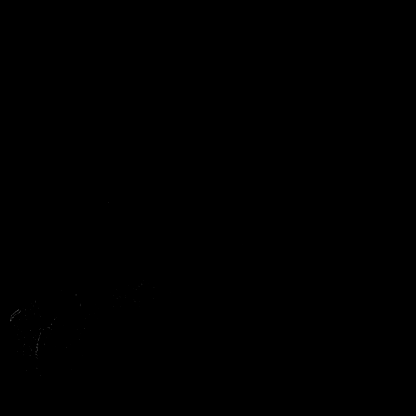
[im 48/192]
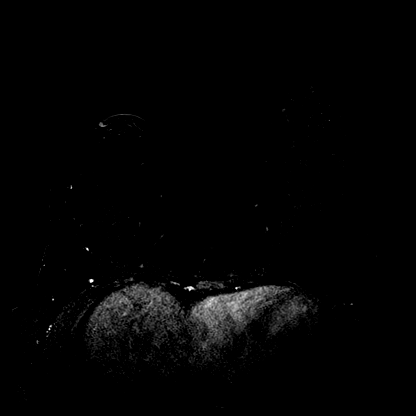
[im 96/192]
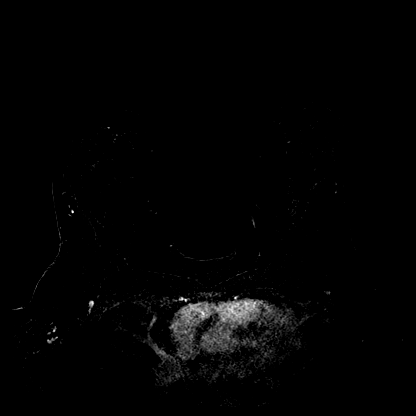
[im 144/192]
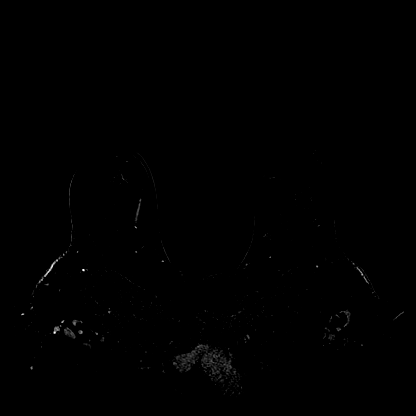
[im 192/192]
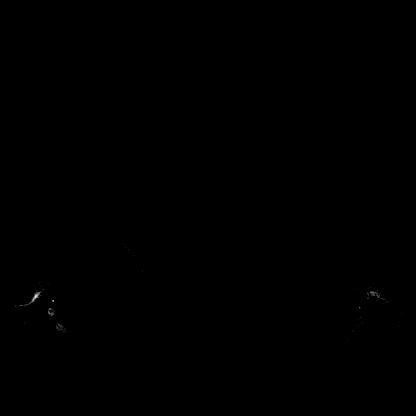

[Series 7: fl3d post-cm 20 · axial · 172.8mm · 0.87mm/px · 1 of 1 slices shown (3 of 3)]
[im 1/1]
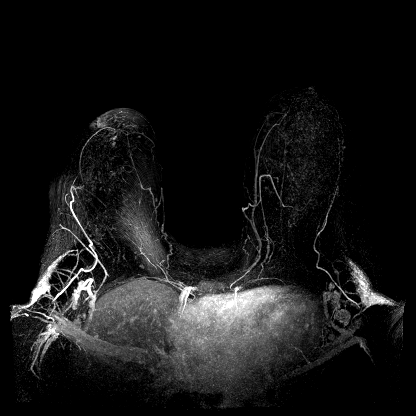

[Series 8: fl3d post-cm 3min · axial · 0.9mm · 0.87mm/px · z∈[-54,+118]mm · 6 of 192 slices shown]
[im 1/192]
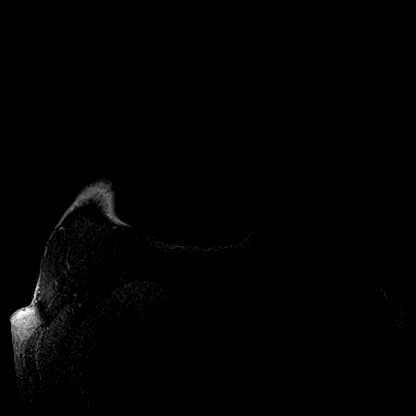
[im 39/192]
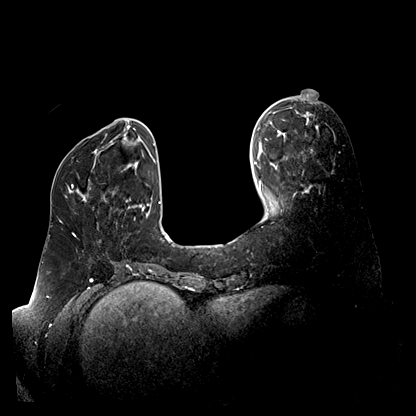
[im 77/192]
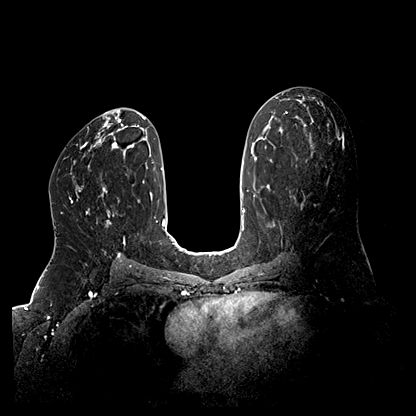
[im 115/192]
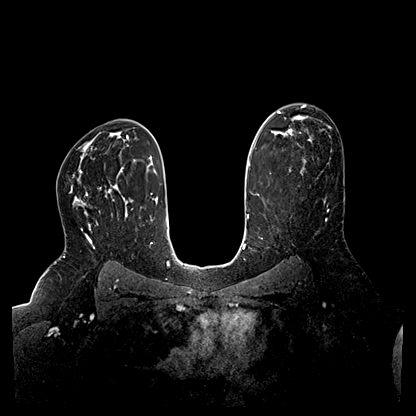
[im 153/192]
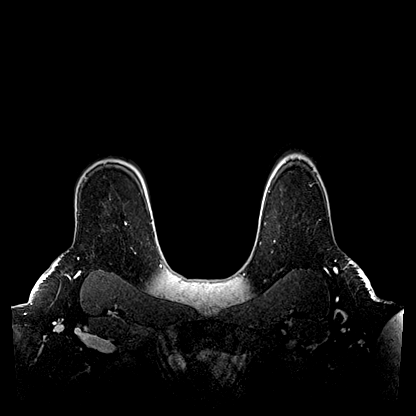
[im 192/192]
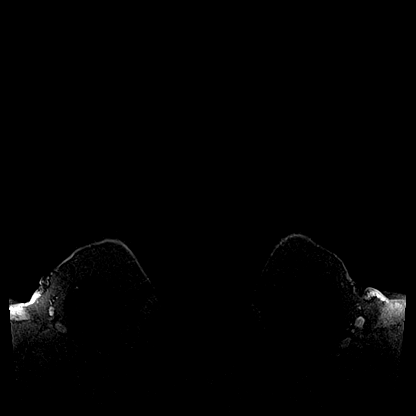

[Series 9: fl3d post-cm 3min_sub · axial · 0.9mm · 0.87mm/px · z∈[-54,-20]mm · 2 of 192 slices shown]
[im 1/192]
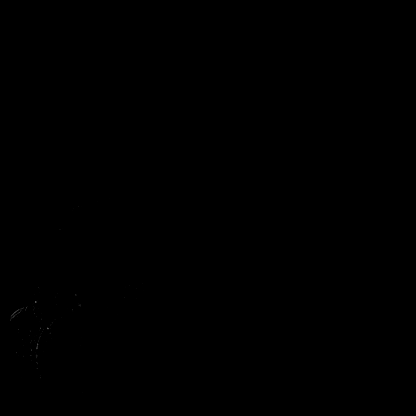
[im 39/192]
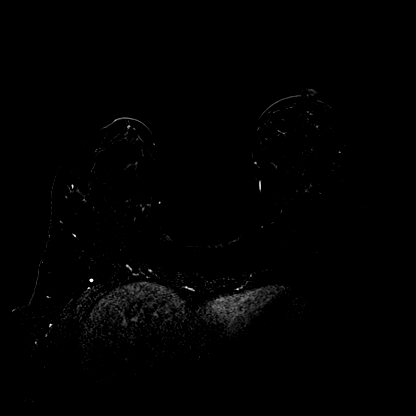

[30 of 48 positions shown; findings below may reference images not displayed]

Three-dimensional MR images were rendered by post-processing of the
original MR data on an independent workstation. The
three-dimensional MR images were interpreted, and findings are
reported in the following complete MRI report for this study. Three
dimensional images were evaluated at the independent [employer]
workstation
FINDINGS: Breast composition: b. Scattered fibroglandular tissue.

Background parenchymal enhancement: Mild

Right breast: There are 2 masses in the anterior right breast seen
on series 6, images 109 and 117. The masses are located at 12
o'clock in the periareolar region. Both masses measure 4 mm with
persistent enhancement kinetics. On sagittal imaging, the masses are
only 5 mm apart.

Left breast: No mass or abnormal enhancement.

Lymph nodes: No abnormal appearing lymph nodes.

Ancillary findings:  None.
IMPRESSION: 1. There are 2 periareolar masses in the anterior right breast, only
5 mm apart based on sagittal imaging.
2. No abnormalities are seen on the left.

RECOMMENDATION:
Recommend MRI guided biopsy of the 2 immediately adjacent right
breast masses. Both should be able be targeted in 1 biopsy.

BI-RADS CATEGORY  4: Suspicious.

## 2019-11-17 MED ORDER — GADOBUTROL 1 MMOL/ML IV SOLN
10.0000 mL | Freq: Once | INTRAVENOUS | Status: AC | PRN
  Administered 2019-11-17: 10 mL via INTRAVENOUS

## 2019-11-23 ENCOUNTER — Other Ambulatory Visit: Payer: Self-pay | Admitting: Family Medicine

## 2019-11-23 DIAGNOSIS — R9389 Abnormal findings on diagnostic imaging of other specified body structures: Secondary | ICD-10-CM

## 2019-12-07 ENCOUNTER — Other Ambulatory Visit: Payer: 59

## 2019-12-16 ENCOUNTER — Encounter (HOSPITAL_COMMUNITY): Payer: Self-pay

## 2019-12-16 ENCOUNTER — Other Ambulatory Visit: Payer: Self-pay

## 2019-12-16 ENCOUNTER — Ambulatory Visit (HOSPITAL_COMMUNITY)
Admission: EM | Admit: 2019-12-16 | Discharge: 2019-12-16 | Disposition: A | Discharge status: Home / Self Care | Payer: 59 | Attending: Physician Assistant | Admitting: Physician Assistant

## 2019-12-16 DIAGNOSIS — Z791 Long term (current) use of non-steroidal anti-inflammatories (NSAID): Secondary | ICD-10-CM | POA: Insufficient documentation

## 2019-12-16 DIAGNOSIS — Z9049 Acquired absence of other specified parts of digestive tract: Secondary | ICD-10-CM | POA: Diagnosis not present

## 2019-12-16 DIAGNOSIS — H6501 Acute serous otitis media, right ear: Secondary | ICD-10-CM | POA: Insufficient documentation

## 2019-12-16 DIAGNOSIS — R42 Dizziness and giddiness: Secondary | ICD-10-CM | POA: Diagnosis not present

## 2019-12-16 DIAGNOSIS — I10 Essential (primary) hypertension: Secondary | ICD-10-CM | POA: Diagnosis not present

## 2019-12-16 DIAGNOSIS — R519 Headache, unspecified: Secondary | ICD-10-CM | POA: Diagnosis not present

## 2019-12-16 DIAGNOSIS — R110 Nausea: Secondary | ICD-10-CM | POA: Diagnosis not present

## 2019-12-16 DIAGNOSIS — Z8249 Family history of ischemic heart disease and other diseases of the circulatory system: Secondary | ICD-10-CM | POA: Insufficient documentation

## 2019-12-16 DIAGNOSIS — Z79899 Other long term (current) drug therapy: Secondary | ICD-10-CM | POA: Diagnosis not present

## 2019-12-16 DIAGNOSIS — Z20822 Contact with and (suspected) exposure to covid-19: Secondary | ICD-10-CM | POA: Diagnosis not present

## 2019-12-16 LAB — SARS CORONAVIRUS 2 (TAT 6-24 HRS): SARS Coronavirus 2: NEGATIVE

## 2019-12-16 MED ORDER — ONDANSETRON HCL 4 MG PO TABS
4.0000 mg | ORAL_TABLET | Freq: Three times a day (TID) | ORAL | 0 refills | Status: DC | PRN
Start: 2019-12-16 — End: 2021-03-25

## 2019-12-16 MED ORDER — ACETAMINOPHEN 325 MG PO TABS
650.0000 mg | ORAL_TABLET | Freq: Four times a day (QID) | ORAL | 0 refills | Status: AC | PRN
Start: 2019-12-16 — End: ?

## 2019-12-16 MED ORDER — FAMOTIDINE 20 MG PO TABS
20.0000 mg | ORAL_TABLET | Freq: Two times a day (BID) | ORAL | 0 refills | Status: DC
Start: 2019-12-16 — End: 2021-03-25

## 2019-12-16 MED ORDER — IBUPROFEN 600 MG PO TABS
600.0000 mg | ORAL_TABLET | Freq: Four times a day (QID) | ORAL | 0 refills | Status: DC | PRN
Start: 2019-12-16 — End: 2021-03-25

## 2019-12-16 MED ORDER — FLUTICASONE PROPIONATE 50 MCG/ACT NA SUSP: 1.0000 | Freq: Every day | NASAL | 0 refills | Status: DC | PRN

## 2019-12-16 NOTE — ED Triage Notes (Signed)
Pt c/o HA, nausea ear pain bilat, but right ear is worsex2 days.Marland Kitchen

## 2019-12-16 NOTE — Discharge Instructions (Addendum)
Take the medicines as prescribed. -Zofran for nausea as needed every 8 hours -Pepcid twice a day -Flonase daily -2 regular strength Tylenol as needed every 6 hours for headache -Ibuprofen as prescribed every 6-8 hours for headache  Minimize screen time  Hydrate and eat small healthy meals  Schedule follow-up with your primary care early next week  If you have severe worsening headache, feel like you will pass out or other severe concerning symptoms go to the emergency department

## 2019-12-16 NOTE — ED Provider Notes (Signed)
[facility]    CSN: [id number] Arrival date & time: 12/16/19  1517      History   Chief Complaint Chief Complaint  Patient presents with  . Otalgia    HPI Mell Talla Jackquline BoschHester is a 39 y.o. female.   Patient presents for headache, right ear pain, nausea, dizziness and feeling of lightheadedness.  She reports she has had a headache on and off for the last few days.  She reports headache started Thursday with some right-sided ear pain.  Describes the headache as wrapping around her head.  She tried some Tylenol and this did help but then developed a feeling of nausea with some lightheadedness and feeling the need to lay down.  She rested a while and then felt better.  She reports she went to sleep and woke up Friday and felt better without a headache.  She reports the headache returned throughout the day.  She had episodes of nausea but did not vomit.  She has been eating and drinking today, small meals.  She reports having episodes of dizziness when looking at screens.  Denies dizziness outside of this.  She does report feeling lightheaded when standing up walking around for a while.  Describes some of this lightheadedness as a swimmy feeling.  She has not felt like she is going to pass out and has not passed out.  Denies shortness of breath or chest pain.  With regard to her ear pain.  She has noted some itching in the ears for some time however recently developed some right-sided ear pain over the last 24 hours.  Denies ringing in the ear or hearing loss.  Denies cough, runny nose, sore throat.  She does continue to endorse fatigue since being diagnosed with mono in the middle of the month.  This is not worse than usual.  She reports good follow-up with her primary care provider     Past Medical History:  Diagnosis Date  . Allergy   . Anxiety   . Depression   . Hypertension     Patient Active Problem List   Diagnosis Date Noted  . History of iron deficiency anemia  11/02/2017  . Essential hypertension 12/03/2016  . Healthcare maintenance 11/17/2016  . Dysuria 11/17/2016    Past Surgical History:  Procedure Laterality Date  . CHOLECYSTECTOMY      OB History   No obstetric history on file.      Home Medications    Prior to Admission medications   Medication Sig Start Date End Date Taking? Authorizing Provider  acetaminophen (TYLENOL) 325 MG tablet Take 2 tablets (650 mg total) by mouth every 6 (six) hours as needed. 12/16/19   Leonard Hendler, Veryl SpeakJacob E, PA-C  dextromethorphan (COUGH DM) 30 MG/5ML liquid Take 60 mg by mouth every 12 (twelve) hours as needed for cough.    [provider]  famotidine (PEPCID) 20 MG tablet Take 1 tablet (20 mg total) by mouth 2 (two) times daily. 12/16/19   Jeanae Whitmill, Veryl SpeakJacob E, PA-C  fluticasone (FLONASE) 50 MCG/ACT nasal spray Place 1 spray into both nostrils daily as needed for allergies or rhinitis. 12/16/19   Pawel Soules, Veryl SpeakJacob E, PA-C  hydrochlorothiazide (HYDRODIURIL) 25 MG tablet Take 25 mg by mouth 2 (two) times daily.    [provider]  ibuprofen (ADVIL) 600 MG tablet Take 1 tablet (600 mg total) by mouth every 6 (six) hours as needed. 12/16/19   Gill Delrossi, Veryl SpeakJacob E, PA-C  loratadine (CLARITIN) 10 MG tablet Take 10  mg by mouth daily.    [provider]  ondansetron (ZOFRAN) 4 MG tablet Take 1 tablet (4 mg total) by mouth every 8 (eight) hours as needed for nausea or vomiting. 12/16/19   Lige Lakeman, Veryl Speak, PA-C    Family History Family History  Problem Relation Age of Onset  . Diabetes Mother   . Hyperlipidemia Mother   . Hypertension Mother   . Intellectual disability Mother   . Diabetes Father   . Hyperlipidemia Father   . Hypertension Father   . Intellectual disability Father   . Diabetes Sister   . Intellectual disability Maternal Grandmother   . Cancer Maternal Grandfather   . Stroke Maternal Grandfather   . Diabetes Maternal Grandfather   . Intellectual disability Paternal Grandmother   . Heart  disease Paternal Grandmother   . Cancer Paternal Grandmother   . Heart disease Paternal Grandfather   . Hyperlipidemia Sister     Social History Social History   Tobacco Use  . Smoking status: Never Smoker  . Smokeless tobacco: Never Used  Vaping Use  . Vaping Use: Never used  Substance Use Topics  . Alcohol use: Not Currently  . Drug use: Never     Allergies   Patient has no known allergies.   Review of Systems Review of Systems   Physical Exam Triage Vital Signs ED Triage Vitals [12/16/19 1613]  Enc Vitals Group     BP (!) 139/96     Pulse Rate 71     Resp 16     Temp 98 F (36.7 C)     Temp Source Oral     SpO2 100 %     Weight (!) 229 lb (103.9 kg)     Height 5\' 6"  (1.676 m)     Head Circumference      Peak Flow      Pain Score 7     Pain Loc      Pain Edu?      Excl. in GC?    Orthostatic VS for the past 24 hrs:  BP- Lying Pulse- Lying BP- Sitting Pulse- Sitting BP- Standing at 0 minutes Pulse- Standing at 0 minutes  12/16/19 1647 136/79 65 124/71 66 (!) 132/91 79    Updated Vital Signs BP (!) 139/96   Pulse 71   Temp 98 F (36.7 C) (Oral)   Resp 16   Ht 5\' 6"  (1.676 m)   Wt (!) 229 lb (103.9 kg)   SpO2 100%   BMI 36.96 kg/m   Visual Acuity Right Eye Distance:   Left Eye Distance:   Bilateral Distance:    Right Eye Near:   Left Eye Near:    Bilateral Near:     Physical Exam Vitals and nursing note reviewed.  Constitutional:      General: She is not in acute distress.    Appearance: She is well-developed. She is not ill-appearing.  HENT:     Head: Normocephalic and atraumatic.     Right Ear: Ear canal and external ear normal.     Left Ear: Tympanic membrane, ear canal and external ear normal.     Ears:     Comments: Right-sided serous effusion.  No pain with manipulation of the tragus or pinna.  No mastoid tenderness.    Nose: Nose normal.     Mouth/Throat:     Mouth: Mucous membranes are moist.     Pharynx: Oropharynx is  clear.  Eyes:  Extraocular Movements: Extraocular movements intact.     Conjunctiva/sclera: Conjunctivae normal.     Pupils: Pupils are equal, round, and reactive to light.  Cardiovascular:     Rate and Rhythm: Normal rate and regular rhythm.     Heart sounds: No murmur heard.   Pulmonary:     Effort: Pulmonary effort is normal. No respiratory distress.     Breath sounds: Normal breath sounds. No wheezing, rhonchi or rales.  Abdominal:     Palpations: Abdomen is soft.     Tenderness: There is no abdominal tenderness.  Musculoskeletal:     Cervical back: Neck supple.     Right lower leg: No edema.     Left lower leg: No edema.  Lymphadenopathy:     Cervical: No cervical adenopathy.  Skin:    General: Skin is warm and dry.     Findings: No rash.  Neurological:     General: No focal deficit present.     Mental Status: She is alert and oriented to person, place, and time.     Cranial Nerves: No cranial nerve deficit.     Sensory: No sensory deficit.     Motor: No weakness.     Coordination: Coordination normal.     Gait: Gait normal.      UC Treatments / Results  Labs (all labs ordered are listed, but only abnormal results are displayed) Labs Reviewed  SARS CORONAVIRUS 2 (TAT 6-24 HRS)    EKG   Radiology No results found.  Procedures Procedures (including critical care time)  Medications Ordered in UC Medications - No data to display  Initial Impression / Assessment and Plan / UC Course  I have reviewed the triage vital signs and the nursing notes.  Pertinent labs & imaging results that were available during my care of the patient were reviewed by me and considered in my medical decision making (see chart for details).     #Serous otitis media #Headache #Lightheadedness #Nausea without vomiting Patient is a 39 year old presenting with numerous concerns.  She has neurovascularly intact afebrile with normal vital signs.  Not orthostatic.  Likely some  aspect of viral illness or post mono fatigue.  We will test her for Covid.  Will treat her symptomatically with strict follow-up and emergency department precautions.  Encourage liquids and small meals.  Instructed her to follow-up with her primary care Monday morning.  Patient verbalized understanding plan of care  Final Clinical Impressions(s) / UC Diagnoses   Final diagnoses:  Right acute serous otitis media, recurrence not specified  Acute nonintractable headache, unspecified headache type  Light headedness  Nausea without vomiting     Discharge Instructions     Take the medicines as prescribed. -Zofran for nausea as needed every 8 hours -Pepcid twice a day -Flonase daily -2 regular strength Tylenol as needed every 6 hours for headache -Ibuprofen as prescribed every 6-8 hours for headache  Minimize screen time  Hydrate and eat small healthy meals  Schedule follow-up with your primary care early next week  If you have severe worsening headache, feel like you will pass out or other severe concerning symptoms go to the emergency department        ED Prescriptions    Medication Sig Dispense Auth. Provider   ondansetron (ZOFRAN) 4 MG tablet Take 1 tablet (4 mg total) by mouth every 8 (eight) hours as needed for nausea or vomiting. 8 tablet Shaleka Brines, Veryl Speak, PA-C   famotidine (PEPCID) 20 MG tablet Take  1 tablet (20 mg total) by mouth 2 (two) times daily. 30 tablet Corie Vavra, Veryl Speak, PA-C   ibuprofen (ADVIL) 600 MG tablet Take 1 tablet (600 mg total) by mouth every 6 (six) hours as needed. 30 tablet Manessa Buley, Veryl Speak, PA-C   fluticasone (FLONASE) 50 MCG/ACT nasal spray Place 1 spray into both nostrils daily as needed for allergies or rhinitis. 15.8 mL Sylvester Minton, Veryl Speak, PA-C   acetaminophen (TYLENOL) 325 MG tablet Take 2 tablets (650 mg total) by mouth every 6 (six) hours as needed. 30 tablet Esa Raden, Veryl Speak, PA-C     PDMP not reviewed this encounter.   Hermelinda Medicus, PA-C 12/16/19  1936

## 2019-12-19 ENCOUNTER — Ambulatory Visit
Admission: RE | Admit: 2019-12-19 | Discharge: 2019-12-19 | Disposition: A | Discharge status: Home / Self Care | Payer: 59 | Source: Ambulatory Visit | Attending: Family Medicine | Admitting: Family Medicine

## 2019-12-19 ENCOUNTER — Other Ambulatory Visit: Payer: Self-pay

## 2019-12-19 ENCOUNTER — Other Ambulatory Visit (HOSPITAL_COMMUNITY): Payer: Self-pay | Admitting: Diagnostic Radiology

## 2019-12-19 DIAGNOSIS — R9389 Abnormal findings on diagnostic imaging of other specified body structures: Secondary | ICD-10-CM

## 2019-12-19 IMAGING — MG MM BREAST LOCALIZATION CLIP
4 series · 4 of 12 positions shown · non-contrast
Comparison: Prior exams

CLINICAL DATA: Evaluate post biopsy marker clip placement following
MRI guided core needle biopsy of a small right breast mass.

EXAM:
DIAGNOSTIC RIGHT MAMMOGRAM POST MRI BIOPSY

[R CC synth-2D]
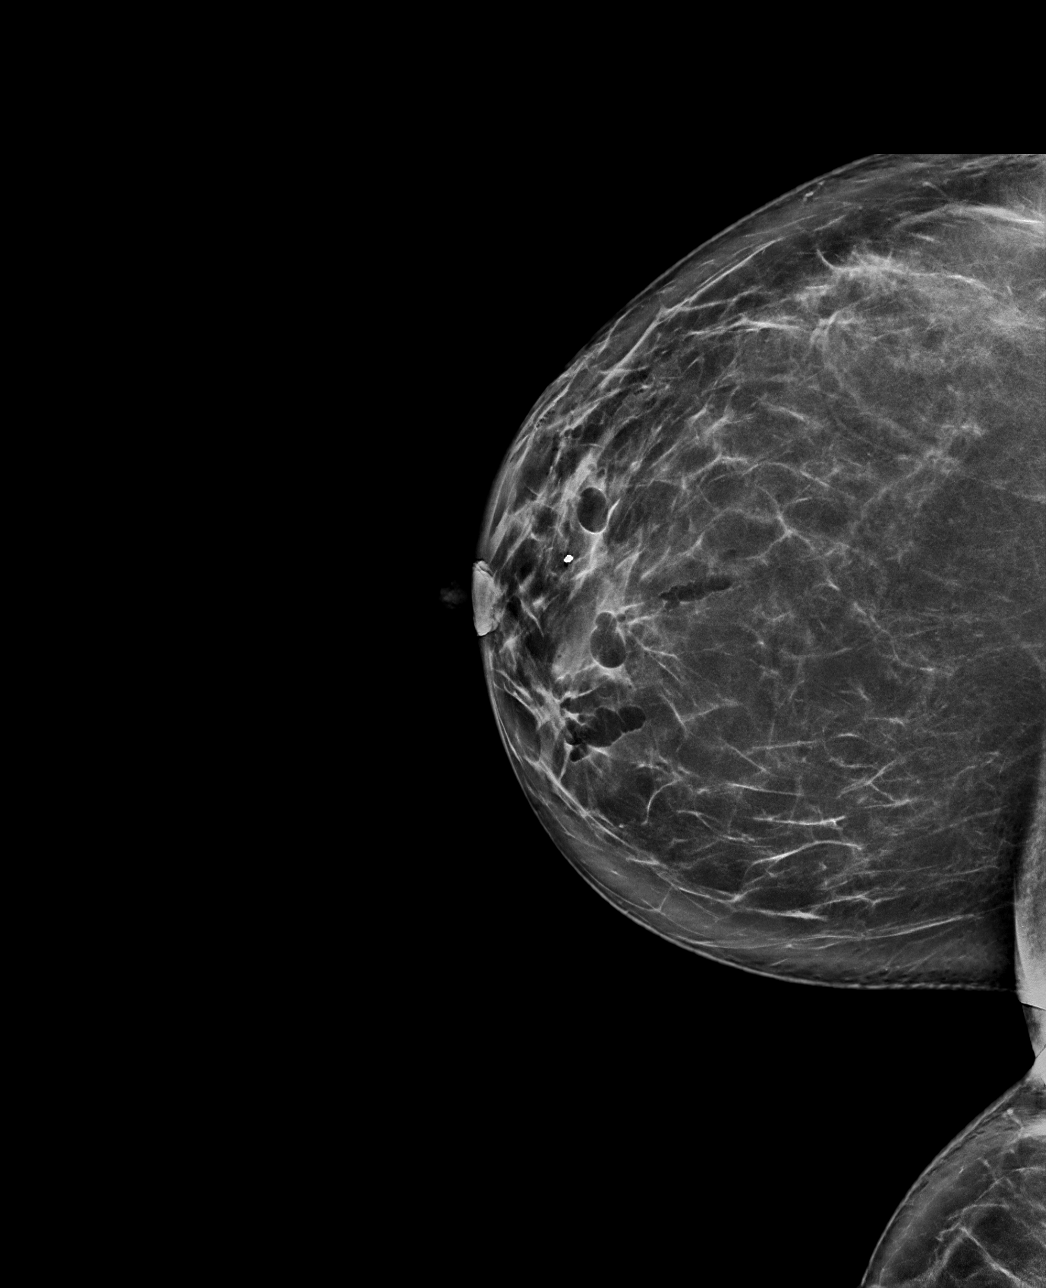

[R ML synth-2D]
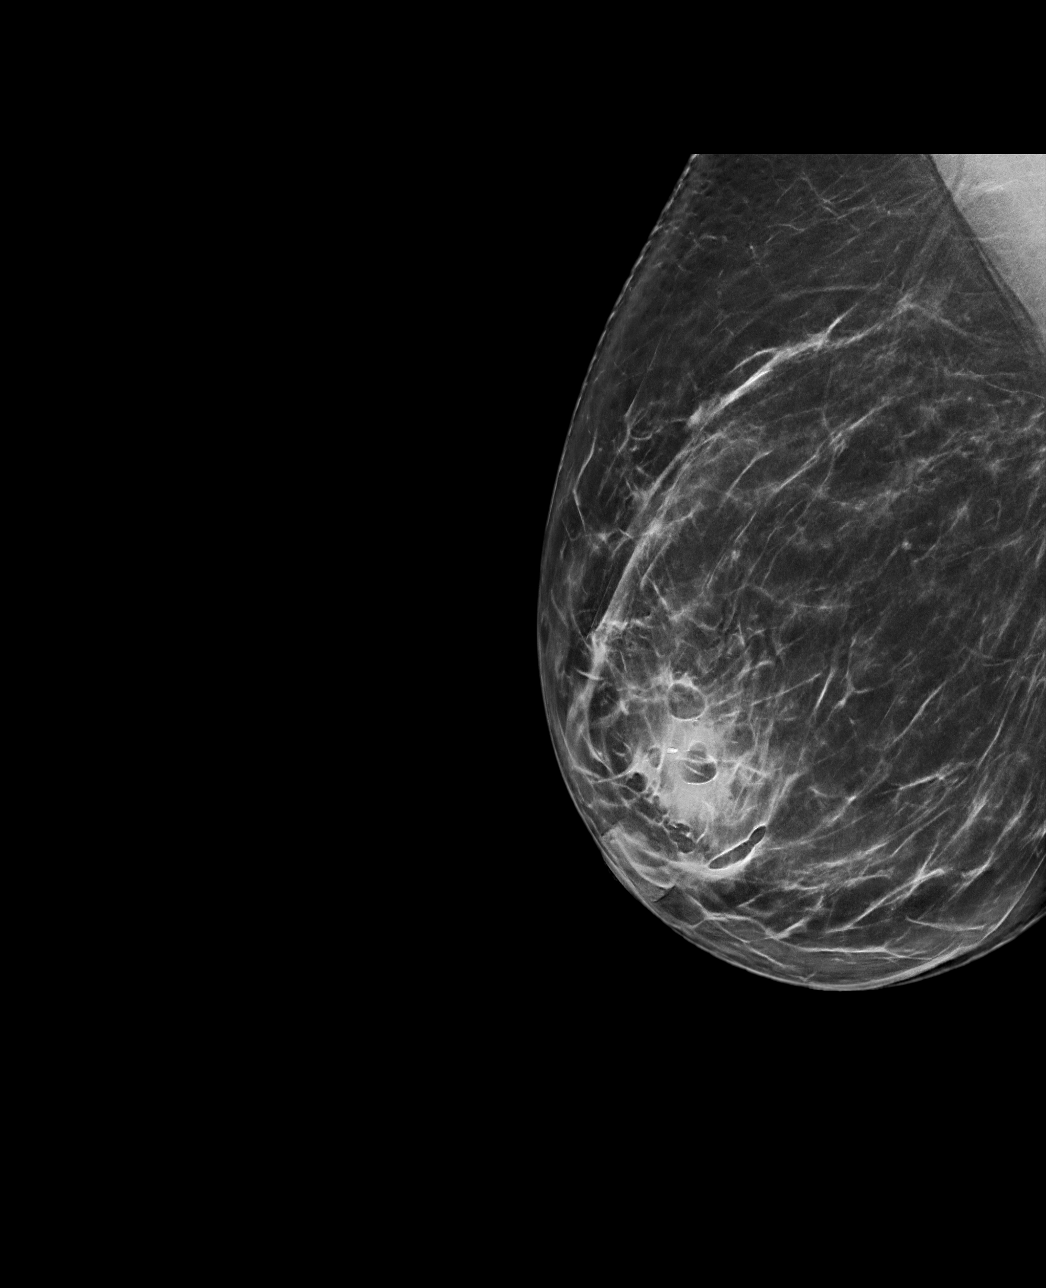

[R ML tomo · tomo slice 45/89.0]
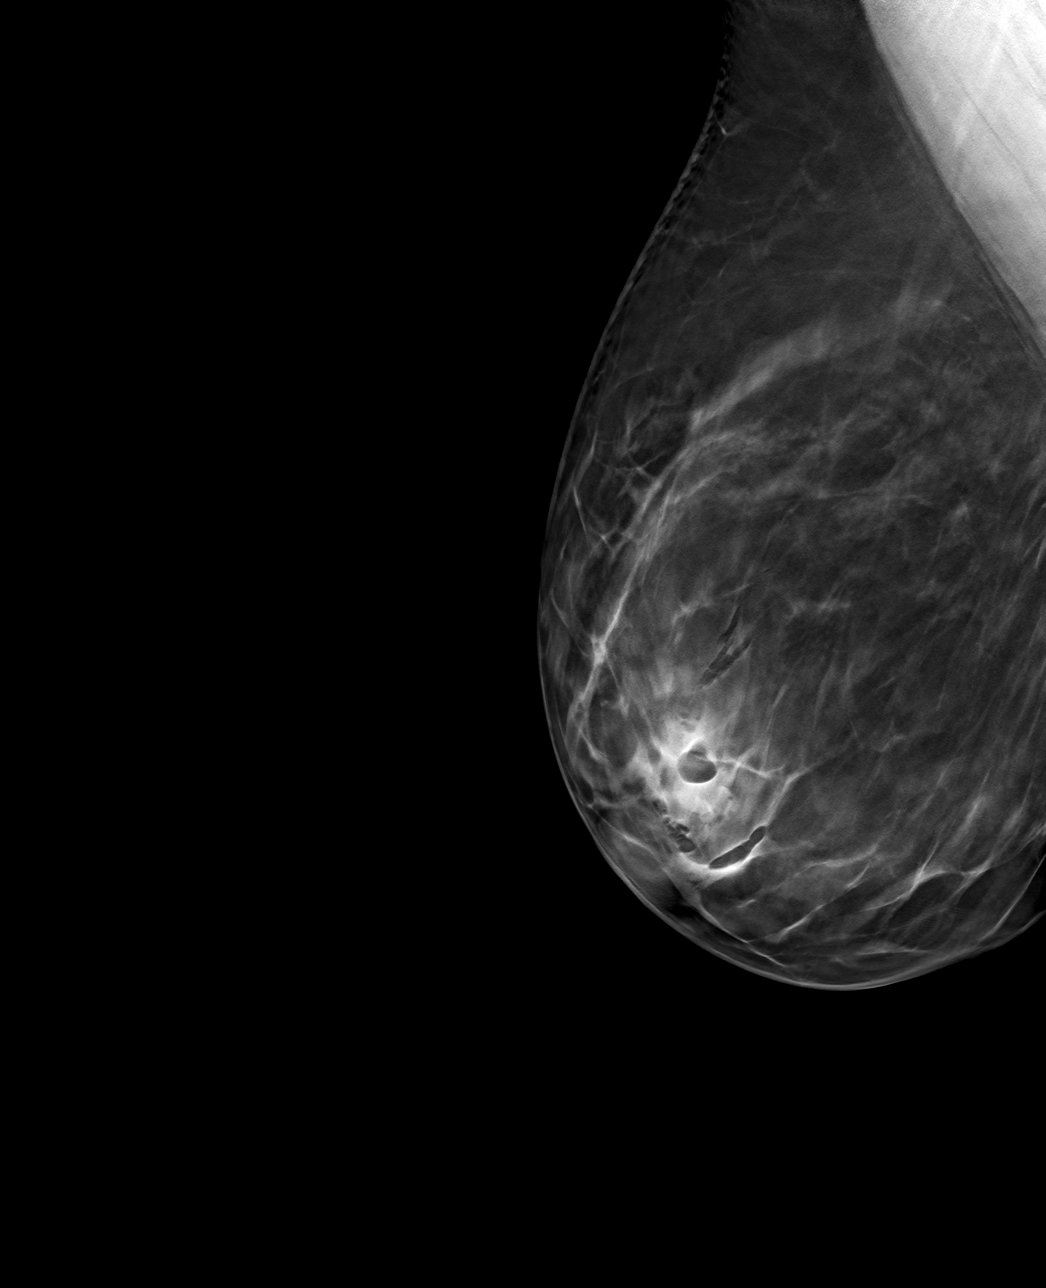

[R CC tomo · tomo slice 45/88.0]
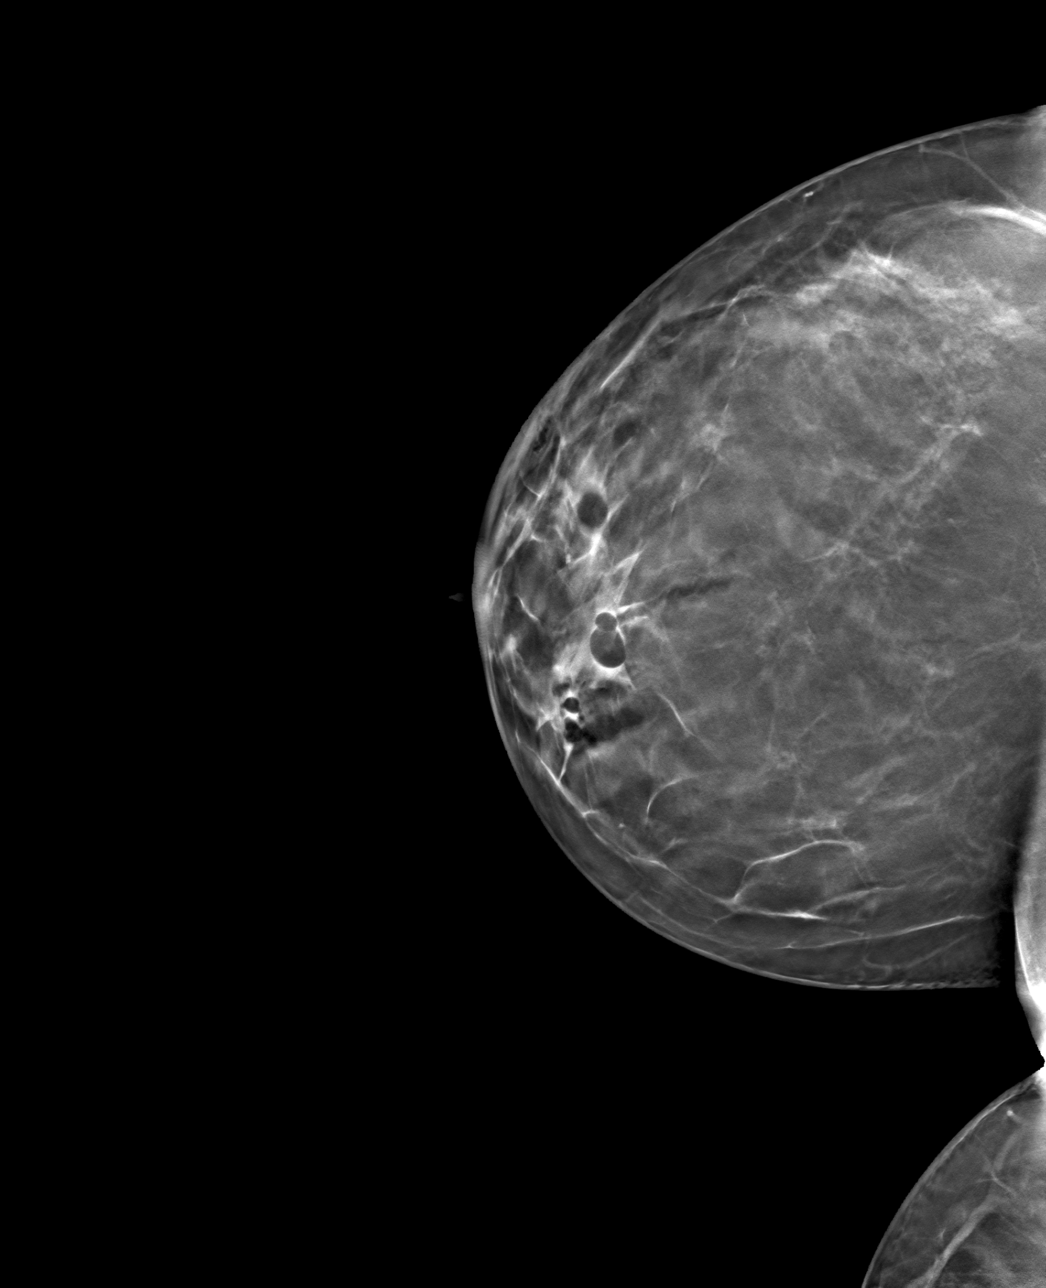

[4 of 12 positions shown; findings below may reference images not displayed]

FINDINGS: Mammographic images were obtained following MRI guided biopsy of
small mass in the anterior, upper outer quadrant of the right
breast. The biopsy marking clip is in expected position at the site
of biopsy.
IMPRESSION: Appropriate positioning of the barbell shaped biopsy marking clip at
the site of biopsy in the anterior, upper outer quadrant. The clip
shape is not well-defined on the images.

Final Assessment: Post Procedure Mammograms for Marker Placement

## 2019-12-19 IMAGING — MR MR BREAST BX W/ LOC DEV 1ST LEASION IMAGE BX SPEC MR GUIDE*R*
6 of 8 series · 32 of 48 positions shown · IV contrast (gadavist)
Comparison: Previous exams.
COMPARISON: Previous exams.

Addendum:
CLINICAL DATA: Patient presents for MRI guided core needle biopsy 2
small adjacent right breast enhancing masses, detected on the
diagnostic breast MRI dated [DATE]. Patient initially presented
with bilateral nipple discharge, and had a negative diagnostic
mammogram on [DATE].

EXAM:
MRI GUIDED CORE NEEDLE BIOPSY OF THE RIGHT BREAST
TECHNIQUE: Multiplanar, multisequence MR imaging of the right breast was
performed both before and after administration of intravenous
contrast.
CONTRAST:  9mL GADAVIST GADOBUTROL 1 MMOL/ML IV SOLN

[Series 2: fiducial unilateral · sagittal · 2.0mm · 1.33mm/px · 1 of 52 slices shown]
[im 1/52]
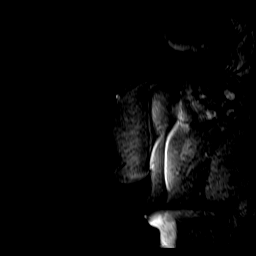

[Series 3: dynamic pre · axial · non-contrast · 1.3mm · 0.73mm/px · z∈[-40,+146]mm · 6 of 144 slices shown]
[im 1/144]
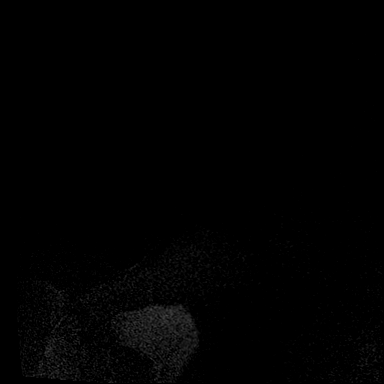
[im 29/144]
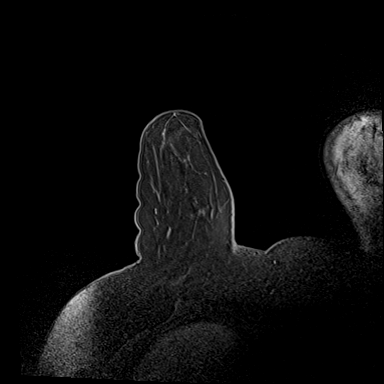
[im 58/144]
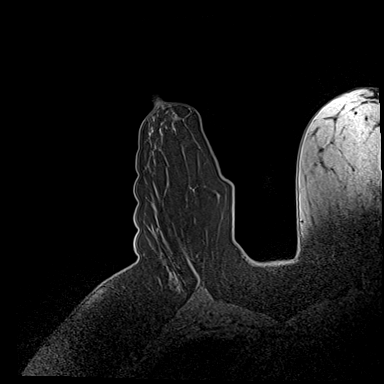
[im 86/144]
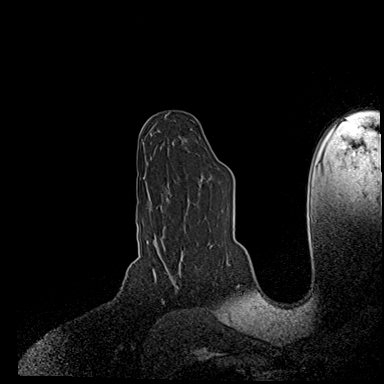
[im 115/144]
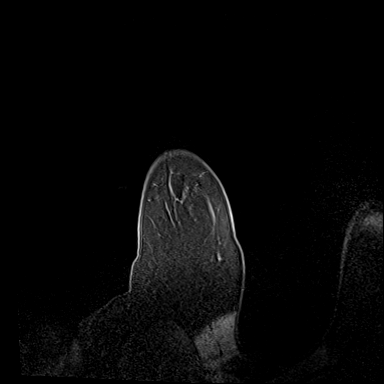
[im 144/144]
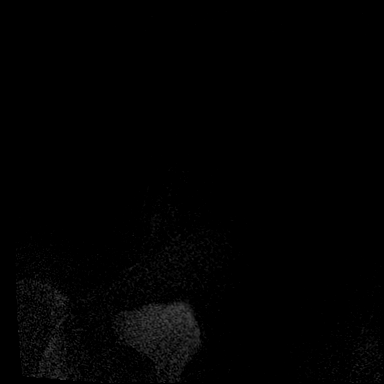

[Series 4: dynamic post 20 · axial · 1.3mm · 0.73mm/px · z∈[-40,+146]mm · 6 of 144 slices shown (1 of 2)]
[im 1/144]
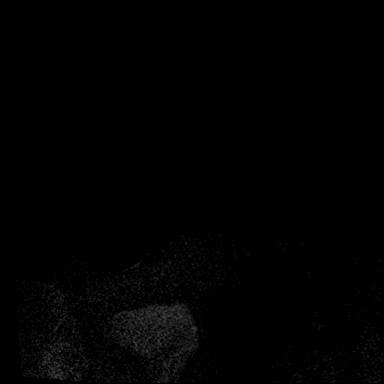
[im 29/144]
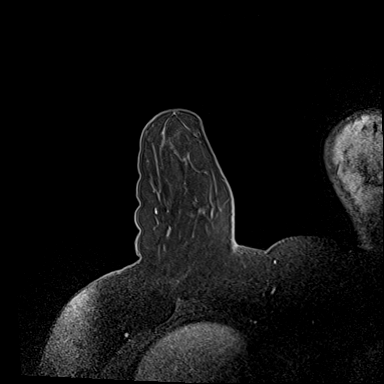
[im 58/144]
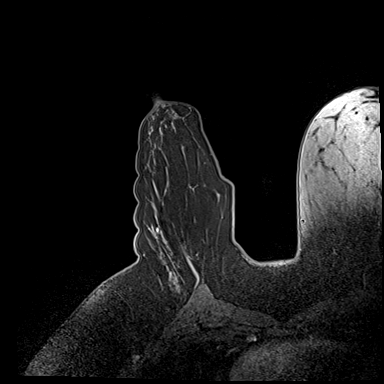
[im 86/144]
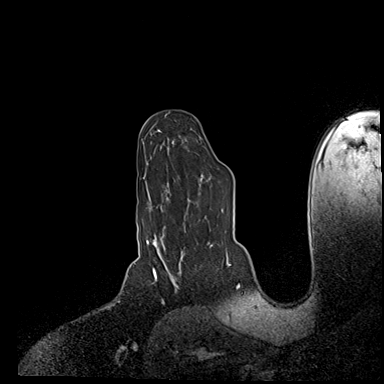
[im 115/144]
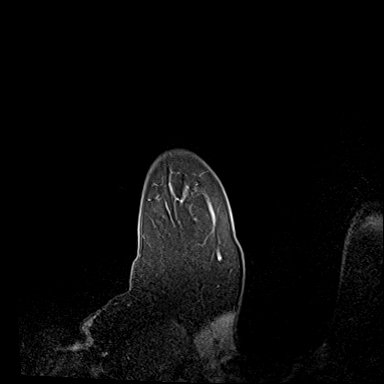
[im 144/144]
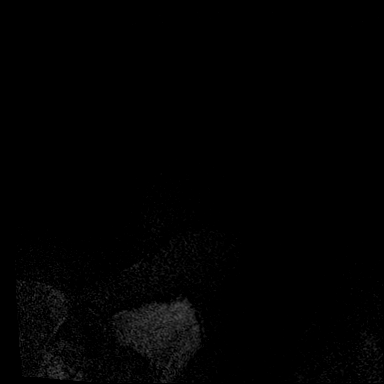

[Series 5: dynamic post 20 · axial · 1.3mm · 0.73mm/px · z∈[-40,+146]mm · 7 of 144 slices shown (2 of 2)]
[im 1/144]
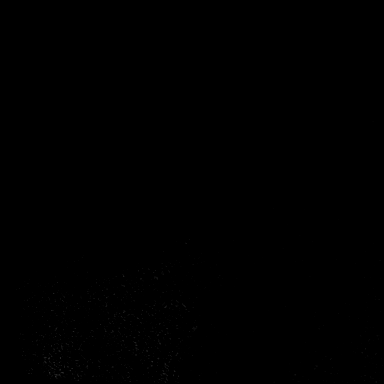
[im 24/144]
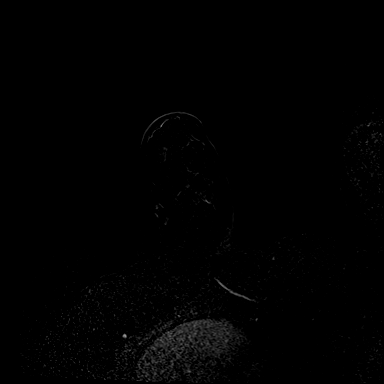
[im 48/144]
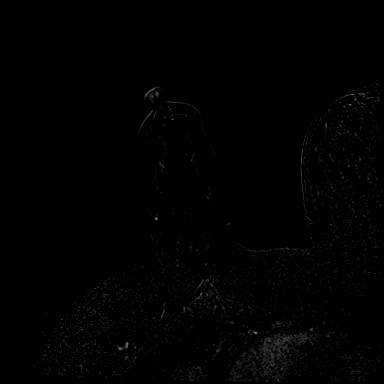
[im 72/144]
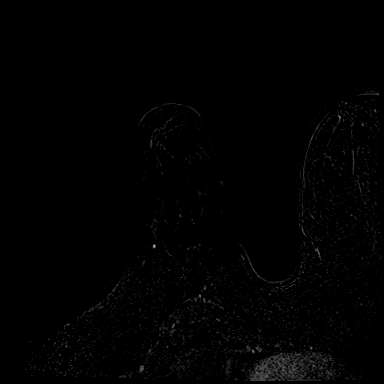
[im 96/144]
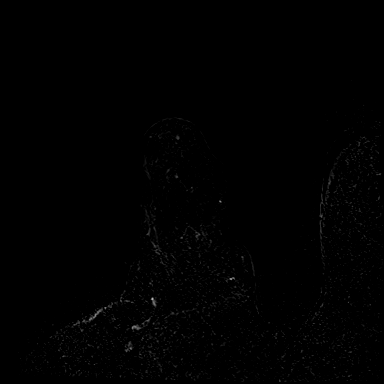
[im 120/144]
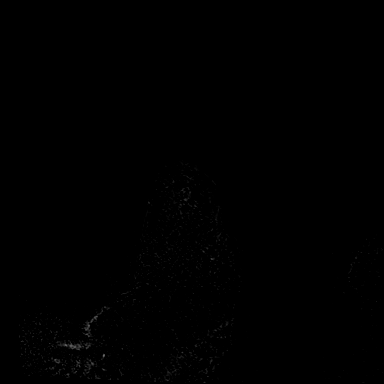
[im 144/144]
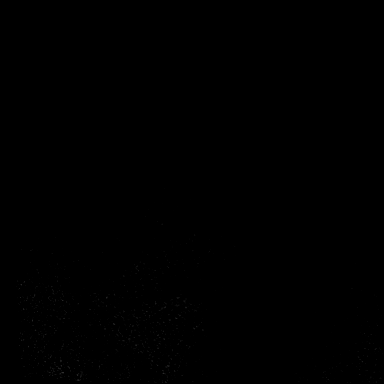

[Series 6: dynamic post 3 · axial · 1.3mm · 0.73mm/px · z∈[-40,+146]mm · 7 of 144 slices shown (1 of 2)]
[im 1/144]
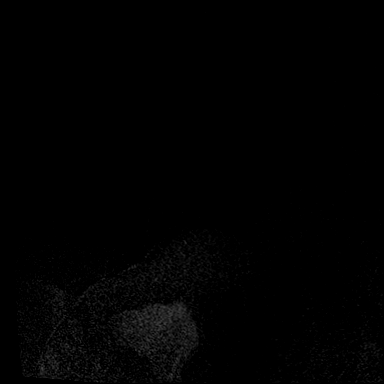
[im 24/144]
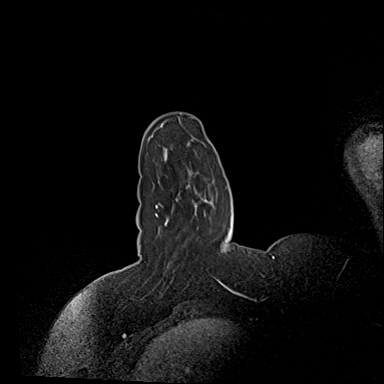
[im 48/144]
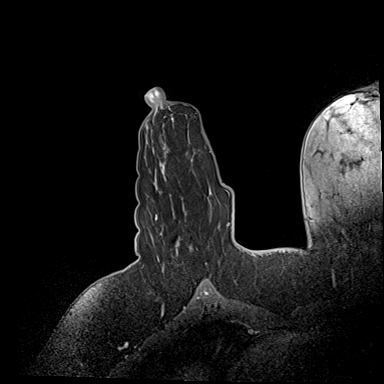
[im 72/144]
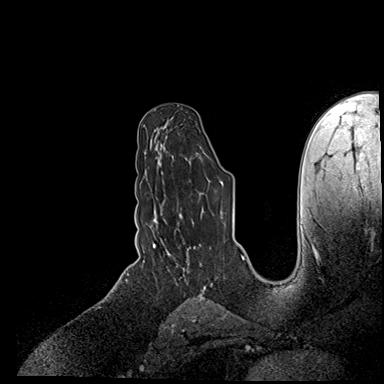
[im 96/144]
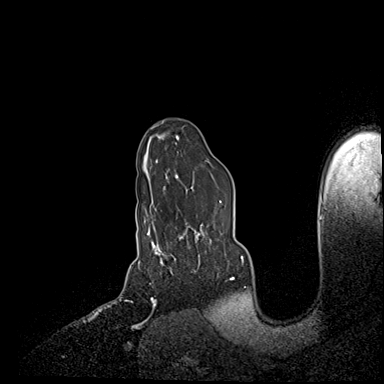
[im 120/144]
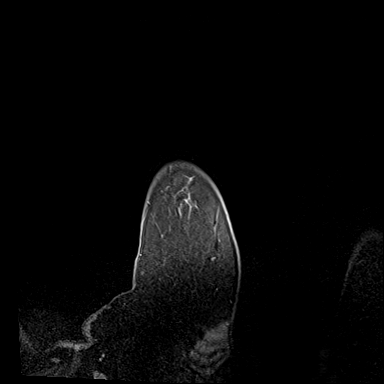
[im 144/144]
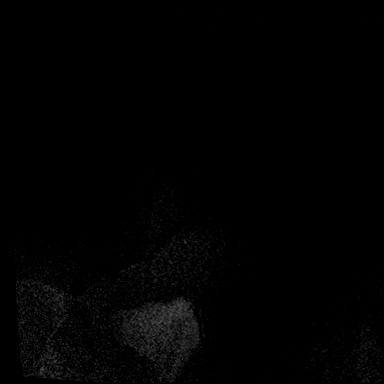

[Series 7: dynamic post 3 · axial · 1.3mm · 0.73mm/px · z∈[-40,+83]mm · 5 of 144 slices shown (2 of 2)]
[im 1/144]
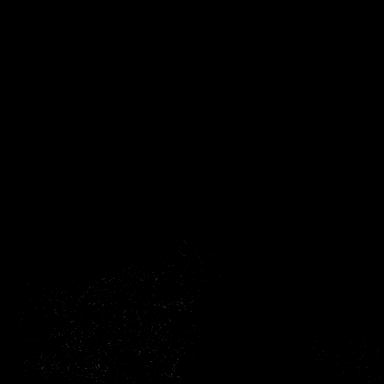
[im 24/144]
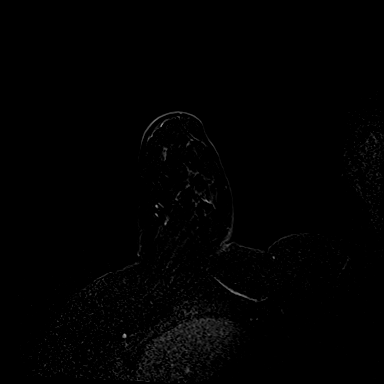
[im 48/144]
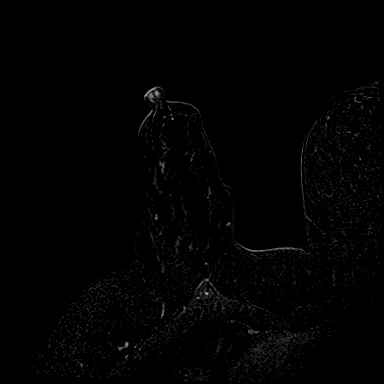
[im 72/144]
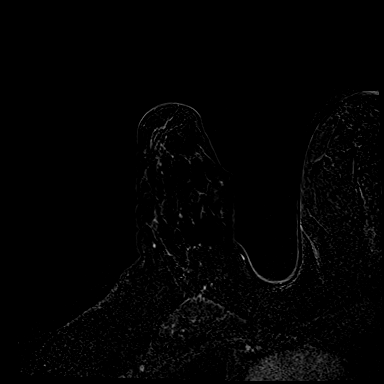
[im 96/144]
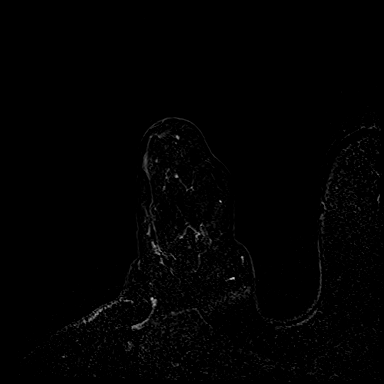

[32 of 48 positions shown; findings below may reference images not displayed]

FINDINGS: I met with the patient, and we discussed the procedure of MRI guided
biopsy, including risks, benefits, and alternatives. Specifically,
we discussed the risks of infection, bleeding, tissue injury, clip
migration, and inadequate sampling. Informed, written consent was
given. The usual time out protocol was performed immediately prior
to the procedure.

On the postcontrast images, 1 of the 2, 4 mm, enhancing masses was
clearly evident (image 69, series 5), the other less apparent and
smaller. The larger of the 2 was targeted, in the anterior slightly
upper outer quadrant of the right breast.

Using sterile technique, 1% Lidocaine, MRI guidance, and a 9 gauge
vacuum assisted device, biopsy was performed of of the 4 mm
enhancing mass using a lateral approach. At the conclusion of the
procedure, a barbell shaped tissue marker clip was deployed into the
biopsy cavity. Follow-up 2-view mammogram was performed and dictated
separately.
IMPRESSION: MRI guided biopsy of a 4 mm right breast mass. No apparent
complications.

ADDENDUM:
Pathology revealed FRAGMENT OF DILATED DUCT WALL, FOCAL
PSEUDOANGIOMATOUS STROMAL HYPERPLASIA AND COLUMNAR CELL CHANGES of
the RIGHT breast, upper outer quadrant anterior depth. This was
found to be concordant by Dr. SRANI.

Pathology results were discussed with the patient by telephone. The
patient reported doing well after the biopsy with tenderness at the
site. Post biopsy instructions and care were reviewed and questions
were answered. The patient was encouraged to call The [REDACTED]

Bilateral breast MRI recommended in 6 months per protocol.

Pathology results reported by SRANI RN on [DATE].

*** End of Addendum ***
FINDINGS: I met with the patient, and we discussed the procedure of MRI guided
biopsy, including risks, benefits, and alternatives. Specifically,
we discussed the risks of infection, bleeding, tissue injury, clip
migration, and inadequate sampling. Informed, written consent was
given. The usual time out protocol was performed immediately prior
to the procedure.

On the postcontrast images, 1 of the 2, 4 mm, enhancing masses was
clearly evident (image 69, series 5), the other less apparent and
smaller. The larger of the 2 was targeted, in the anterior slightly
upper outer quadrant of the right breast.

Using sterile technique, 1% Lidocaine, MRI guidance, and a 9 gauge
vacuum assisted device, biopsy was performed of of the 4 mm
enhancing mass using a lateral approach. At the conclusion of the
procedure, a barbell shaped tissue marker clip was deployed into the
biopsy cavity. Follow-up 2-view mammogram was performed and dictated
separately.
IMPRESSION: MRI guided biopsy of a 4 mm right breast mass. No apparent
complications.

## 2019-12-19 MED ORDER — GADOBUTROL 1 MMOL/ML IV SOLN
9.0000 mL | Freq: Once | INTRAVENOUS | Status: AC | PRN
  Administered 2019-12-19: 9 mL via INTRAVENOUS

## 2020-07-10 ENCOUNTER — Emergency Department (HOSPITAL_COMMUNITY)
Admission: EM | Admit: 2020-07-10 | Discharge: 2020-07-10 | Disposition: A | Discharge status: Home / Self Care | Payer: 59 | Attending: Emergency Medicine | Admitting: Emergency Medicine

## 2020-07-10 ENCOUNTER — Other Ambulatory Visit: Payer: Self-pay

## 2020-07-10 ENCOUNTER — Encounter (HOSPITAL_COMMUNITY): Payer: Self-pay

## 2020-07-10 DIAGNOSIS — Z79899 Other long term (current) drug therapy: Secondary | ICD-10-CM | POA: Insufficient documentation

## 2020-07-10 DIAGNOSIS — I10 Essential (primary) hypertension: Secondary | ICD-10-CM | POA: Diagnosis not present

## 2020-07-10 DIAGNOSIS — Z23 Encounter for immunization: Secondary | ICD-10-CM | POA: Insufficient documentation

## 2020-07-10 DIAGNOSIS — Y9289 Other specified places as the place of occurrence of the external cause: Secondary | ICD-10-CM | POA: Diagnosis not present

## 2020-07-10 DIAGNOSIS — S6992XA Unspecified injury of left wrist, hand and finger(s), initial encounter: Secondary | ICD-10-CM | POA: Diagnosis present

## 2020-07-10 DIAGNOSIS — W268XXA Contact with other sharp object(s), not elsewhere classified, initial encounter: Secondary | ICD-10-CM | POA: Insufficient documentation

## 2020-07-10 DIAGNOSIS — S61412A Laceration without foreign body of left hand, initial encounter: Secondary | ICD-10-CM | POA: Diagnosis not present

## 2020-07-10 MED ORDER — TETANUS-DIPHTH-ACELL PERTUSSIS 5-2.5-18.5 LF-MCG/0.5 IM SUSY
0.5000 mL | PREFILLED_SYRINGE | Freq: Once | INTRAMUSCULAR | Status: AC
  Administered 2020-07-10: 0.5 mL via INTRAMUSCULAR
  Filled 2020-07-10: qty 0.5

## 2020-07-10 MED ORDER — LIDOCAINE-EPINEPHRINE-TETRACAINE (LET) TOPICAL GEL
3.0000 mL | Freq: Once | TOPICAL | Status: AC
  Administered 2020-07-10: 3 mL via TOPICAL
  Filled 2020-07-10: qty 3

## 2020-07-10 NOTE — ED Provider Notes (Signed)
[facility]-EMERGENCY DEPT Provider Note   CSN: [id number] Arrival date & time: 07/10/20  2016     History No chief complaint on file.   Angie Roberts is a 40 y.o. female.  The history is provided by the patient. No language interpreter was used.     40 year old female presenting for evaluation of hand injury.  Patient report just prior to arrival, she was reaching for something underneath the chair when the type of pain was cut against a sharp object.  She reported cute onset of sharp pain and it was bleeding which concerns her.  This is involving her nondominant hand.  She is unable to recall last tetanus status.  Pain is minimal at this time.  No associated numbness.  Pain is nonradiating.  No other injury.  No specific treatment tried.  Past Medical History:  Diagnosis Date  . Allergy   . Anxiety   . Depression   . Hypertension     Patient Active Problem List   Diagnosis Date Noted  . History of iron deficiency anemia 11/02/2017  . Essential hypertension 12/03/2016  . Healthcare maintenance 11/17/2016  . Dysuria 11/17/2016    Past Surgical History:  Procedure Laterality Date  . CHOLECYSTECTOMY       OB History   No obstetric history on file.     Family History  Problem Relation Age of Onset  . Diabetes Mother   . Hyperlipidemia Mother   . Hypertension Mother   . Intellectual disability Mother   . Diabetes Father   . Hyperlipidemia Father   . Hypertension Father   . Intellectual disability Father   . Diabetes Sister   . Intellectual disability Maternal Grandmother   . Cancer Maternal Grandfather   . Stroke Maternal Grandfather   . Diabetes Maternal Grandfather   . Intellectual disability Paternal Grandmother   . Heart disease Paternal Grandmother   . Cancer Paternal Grandmother   . Heart disease Paternal Grandfather   . Hyperlipidemia Sister     Social History   Tobacco Use  . Smoking status: Never Smoker  .  Smokeless tobacco: Never Used  Vaping Use  . Vaping Use: Never used  Substance Use Topics  . Alcohol use: Not Currently  . Drug use: Never    Home Medications Prior to Admission medications   Medication Sig Start Date End Date Taking? Authorizing Provider  acetaminophen (TYLENOL) 325 MG tablet Take 2 tablets (650 mg total) by mouth every 6 (six) hours as needed. 12/16/19   Darr, Gerilyn Pilgrim, PA-C  dextromethorphan (COUGH DM) 30 MG/5ML liquid Take 60 mg by mouth every 12 (twelve) hours as needed for cough.    [provider]  famotidine (PEPCID) 20 MG tablet Take 1 tablet (20 mg total) by mouth 2 (two) times daily. 12/16/19   Darr, Gerilyn Pilgrim, PA-C  fluticasone (FLONASE) 50 MCG/ACT nasal spray Place 1 spray into both nostrils daily as needed for allergies or rhinitis. 12/16/19   Darr, Gerilyn Pilgrim, PA-C  hydrochlorothiazide (HYDRODIURIL) 25 MG tablet Take 25 mg by mouth 2 (two) times daily.    [provider]  ibuprofen (ADVIL) 600 MG tablet Take 1 tablet (600 mg total) by mouth every 6 (six) hours as needed. 12/16/19   Darr, Gerilyn Pilgrim, PA-C  loratadine (CLARITIN) 10 MG tablet Take 10 mg by mouth daily.    [provider]  ondansetron (ZOFRAN) 4 MG tablet Take 1 tablet (4 mg total) by mouth every 8 (eight) hours as needed for nausea  or vomiting. 12/16/19   Darr, Gerilyn Pilgrim, PA-C    Allergies    Patient has no known allergies.  Review of Systems   Review of Systems  Constitutional: Negative for fever.  Skin: Positive for wound.  Neurological: Negative for numbness.    Physical Exam Updated Vital Signs BP (!) 152/97 (BP Location: Left Arm)   Pulse 87   Temp 98.6 F (37 C) (Oral)   Resp 17   Ht 5\' 6"  (1.676 m)   Wt 106.6 kg   LMP 06/18/2020   SpO2 98%   BMI 37.93 kg/m   Physical Exam Vitals and nursing note reviewed.  Constitutional:      General: She is not in acute distress.    Appearance: She is well-developed and well-nourished.  HENT:     Head: Atraumatic.  Eyes:      Conjunctiva/sclera: Conjunctivae normal.  Musculoskeletal:        General: Signs of injury (Left hand: On the dorsum of the hand in between the third and fourth finger dorsally there is a vertical 1 cm shallow laceration not actively bleeding without any foreign body and does not appears to be contaminated.  Mildly tender to palpation.) present.     Cervical back: Neck supple.  Skin:    Findings: No rash.  Neurological:     Mental Status: She is alert.  Psychiatric:        Mood and Affect: Mood and affect normal.     ED Results / Procedures / Treatments   Labs (all labs ordered are listed, but only abnormal results are displayed) Labs Reviewed - No data to display  EKG None  Radiology No results found.  Procedures .04/01/2022Laceration Repair  Date/Time: 07/10/2020 8:55 PM Performed by: 07/12/2020, PA-C Authorized by: Fayrene Helper, PA-C   Consent:    Consent obtained:  Verbal   Consent given by:  Patient   Risks discussed:  Infection, need for additional repair, pain, poor cosmetic result and poor wound healing   Alternatives discussed:  No treatment and delayed treatment Universal protocol:    Procedure explained and questions answered to patient or proxy's satisfaction: yes     Relevant documents present and verified: yes     Test results available: yes     Imaging studies available: yes     Required blood products, implants, devices, and special equipment available: yes     Site/side marked: yes     Immediately prior to procedure, a time out was called: yes     Patient identity confirmed:  Verbally with patient Anesthesia:    Anesthesia method:  Topical application   Topical anesthetic:  LET Laceration details:    Location:  Hand   Hand location:  L hand, dorsum   Length (cm):  1   Depth (mm):  2 Exploration:    Limited defect created (wound extended): no     Hemostasis achieved with:  LET   Wound exploration: wound explored through full range of motion and entire  depth of wound visualized     Contaminated: no   Treatment:    Area cleansed with:  Saline   Amount of cleaning:  Standard   Irrigation solution:  Tap water   Debridement:  None   Undermining:  None Skin repair:    Repair method:  Steri-Strips and tissue adhesive   Number of Steri-Strips:  2 Approximation:    Approximation:  Close Repair type:    Repair type:  Simple Post-procedure details:  Dressing:  Open (no dressing)   Procedure completion:  Tolerated well, no immediate complications     Medications Ordered in ED Medications  Tdap (BOOSTRIX) injection 0.5 mL (0.5 mLs Intramuscular Given 07/10/20 2036)  lidocaine-EPINEPHrine-tetracaine (LET) topical gel (3 mLs Topical Given 07/10/20 2036)    ED Course  I have reviewed the triage vital signs and the nursing notes.  Pertinent labs & imaging results that were available during my care of the patient were reviewed by me and considered in my medical decision making (see chart for details).    MDM Rules/Calculators/A&P                          BP (!) 152/97 (BP Location: Left Arm)   Pulse 87   Temp 98.6 F (37 C) (Oral)   Resp 17   Ht 5\' 6"  (1.676 m)   Wt 106.6 kg   LMP 06/18/2020   SpO2 98%   BMI 37.93 kg/m   Final Clinical Impression(s) / ED Diagnoses Final diagnoses:  Laceration of left hand, initial encounter    Rx / DC Orders ED Discharge Orders    None     8:32 PM Patient here with minor laceration involving her left hand on the dorsum of the hand in between the third and fourth metacarpal without any joint involvement.  This is a shallow laceration therefore will irrigate, apply let, Dermabond, and provide appropriate laceration repair.   08/16/2020, PA-C 07/10/20 07/12/20    [id number], MD 07/11/20 1531

## 2020-07-10 NOTE — ED Triage Notes (Signed)
Pt cut the top of her hand when reaching under her chair, bleeding controlled at present Pt does take blood thinners

## 2020-07-15 ENCOUNTER — Other Ambulatory Visit: Payer: Self-pay | Admitting: Chiropractic Medicine

## 2020-07-15 DIAGNOSIS — M5459 Other low back pain: Secondary | ICD-10-CM

## 2020-07-21 ENCOUNTER — Other Ambulatory Visit: Payer: Self-pay

## 2020-07-21 ENCOUNTER — Ambulatory Visit
Admission: RE | Admit: 2020-07-21 | Discharge: 2020-07-21 | Disposition: A | Discharge status: Home / Self Care | Payer: 59 | Source: Ambulatory Visit | Attending: Chiropractic Medicine | Admitting: Chiropractic Medicine

## 2020-07-21 DIAGNOSIS — M5459 Other low back pain: Secondary | ICD-10-CM

## 2020-07-21 IMAGING — MR MR LUMBAR SPINE W/O CM
4 of 5 series · 26 of 48 positions shown · non-contrast
Comparison: None available.

CLINICAL DATA: Initial evaluation for low back pain with radiation
into the right buttock and hip and down to mid thigh.

EXAM:
MRI LUMBAR SPINE WITHOUT CONTRAST
TECHNIQUE: Multiplanar, multisequence MR imaging of the lumbar spine was
performed. No intravenous contrast was administered.

[Series 3: T2 · sagittal · 4.0mm · 1.09mm/px · 6 of 14 slices shown (1 of 2)]
[im 1/14]
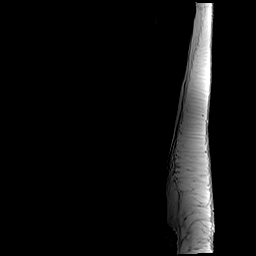
[im 3/14]
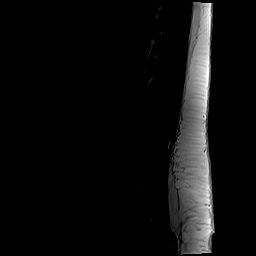
[im 6/14]
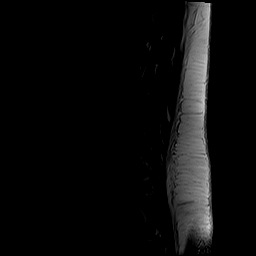
[im 8/14]
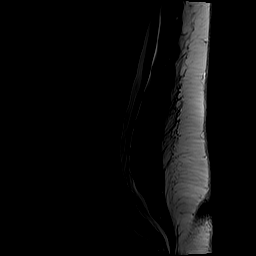
[im 11/14]
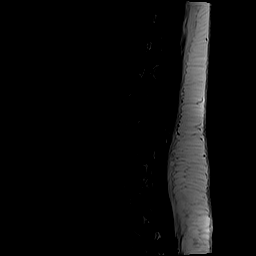
[im 14/14]
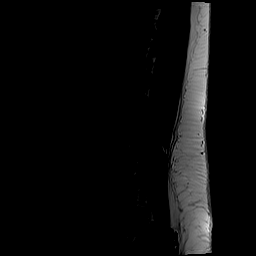

[Series 5: T1 · sagittal · 4.0mm · 1.09mm/px · 5 of 14 slices shown (1 of 2)]
[im 1/14]
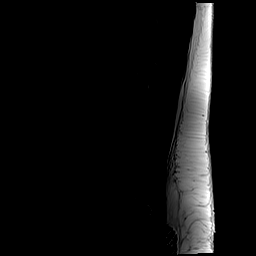
[im 4/14]
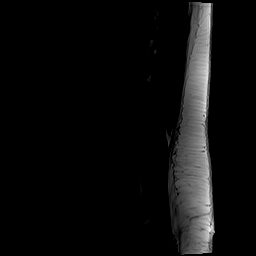
[im 7/14]
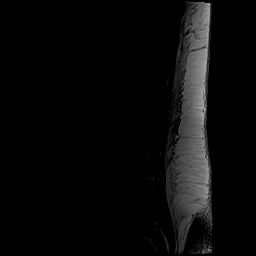
[im 10/14]
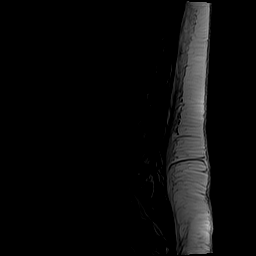
[im 14/14]
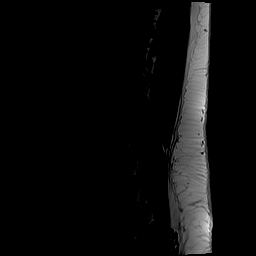

[Series 6: T2 · axial · 4.0mm · 0.39mm/px · z∈[-45,+171]mm · 10 of 45 slices shown (2 of 2)]
[im 3/45]
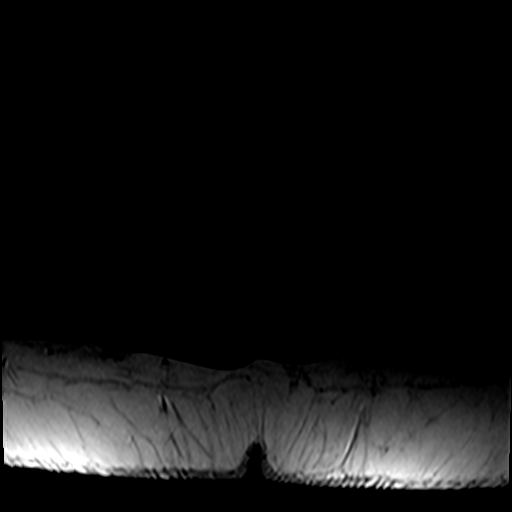
[im 6/45]
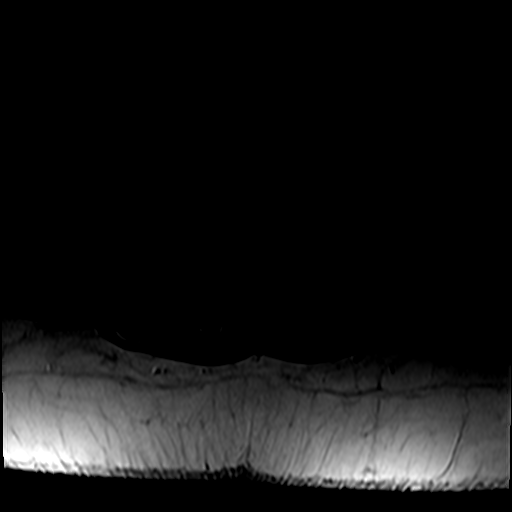
[im 9/45]
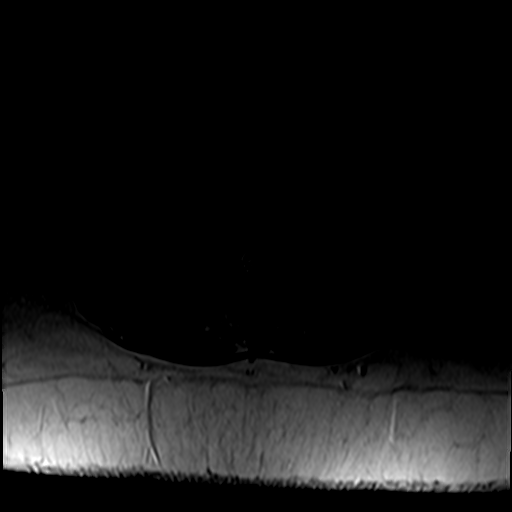
[im 15/45]
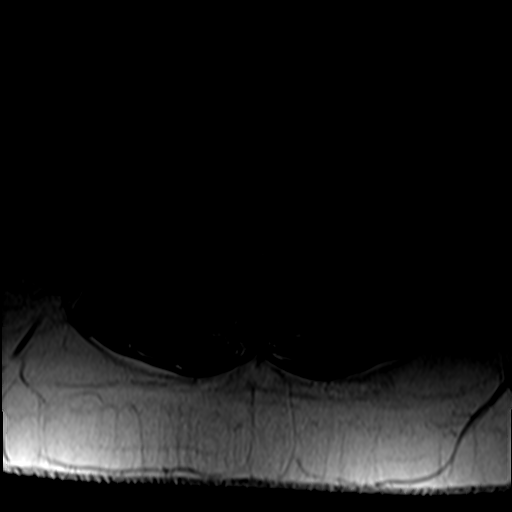
[im 21/45]
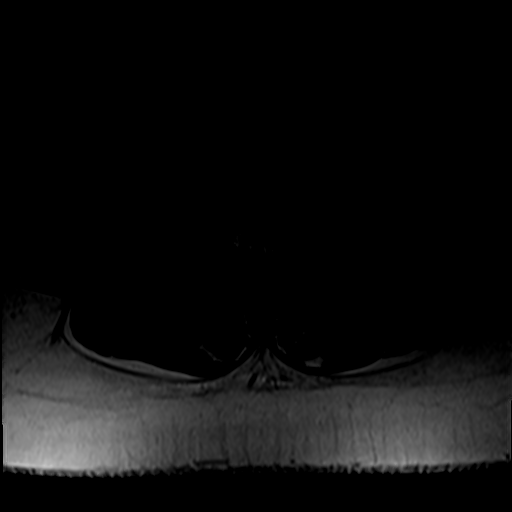
[im 24/45]
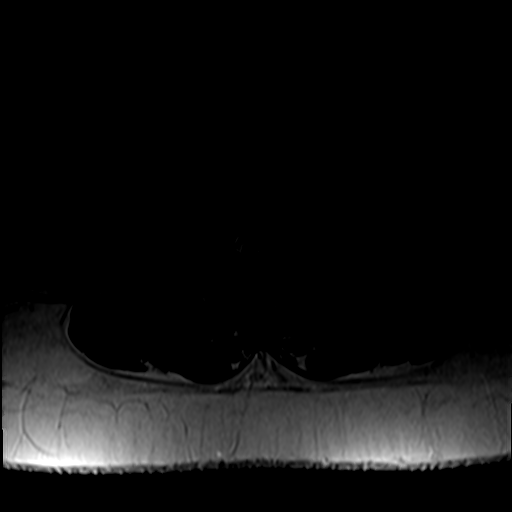
[im 27/45]
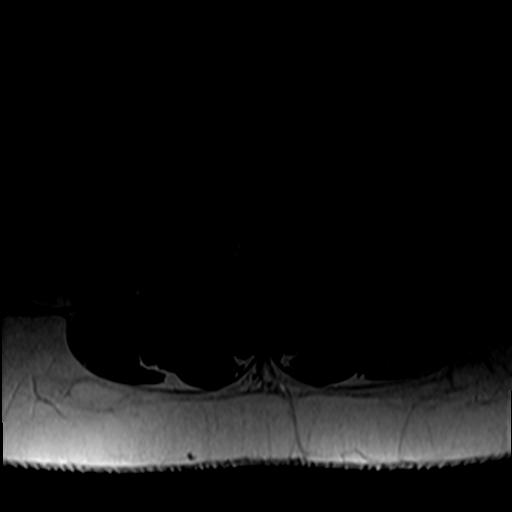
[im 33/45]
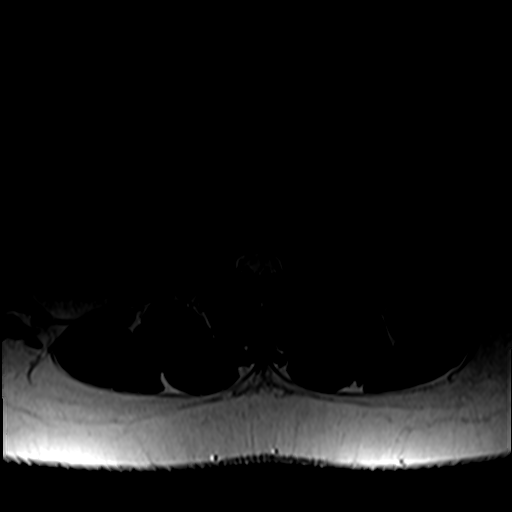
[im 39/45]
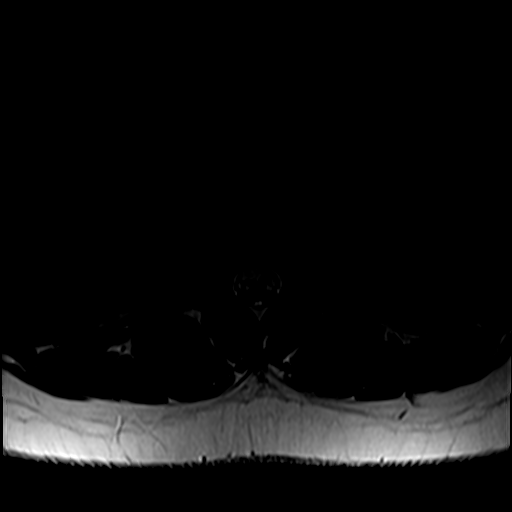
[im 45/45]
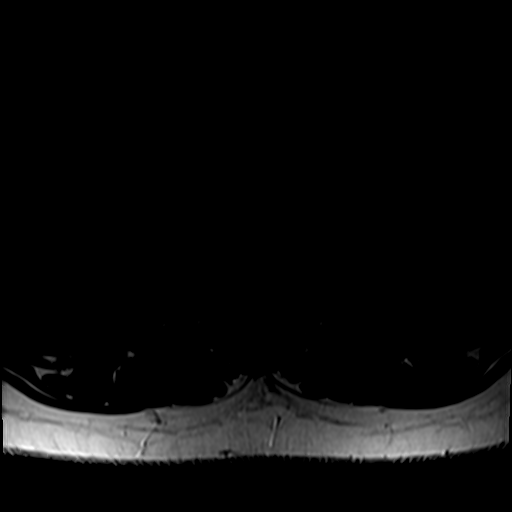

[Series 7: T1 · axial · 4.0mm · 0.39mm/px · z∈[-45,+142]mm · 5 of 45 slices shown (2 of 2)]
[im 3/45]
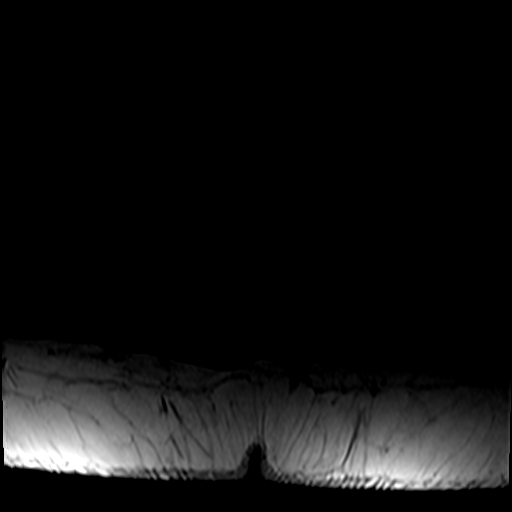
[im 6/45]
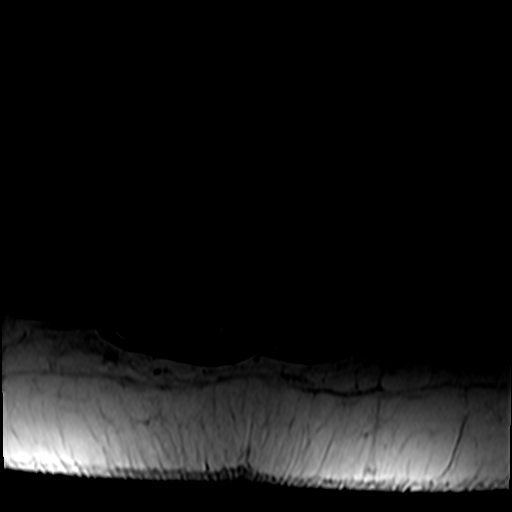
[im 9/45]
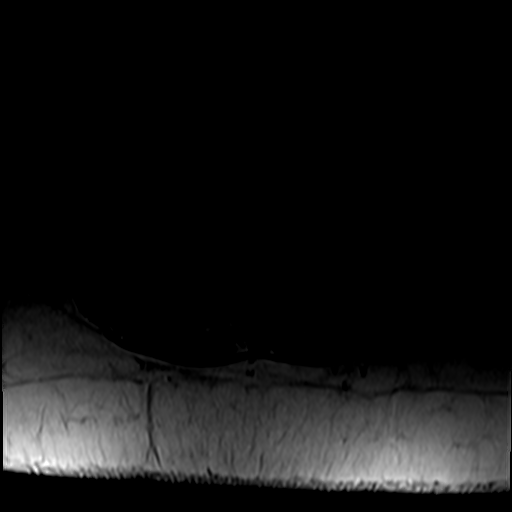
[im 24/45]
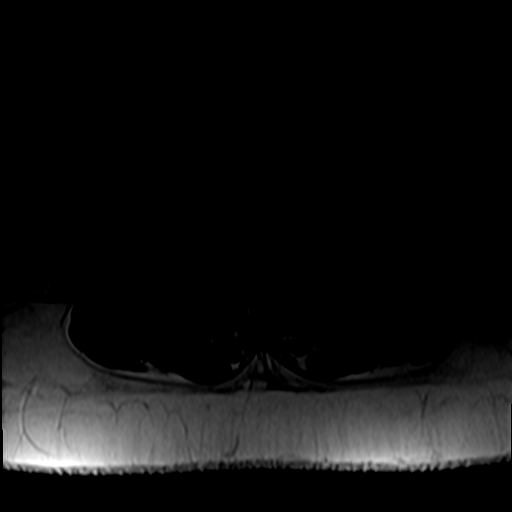
[im 39/45]
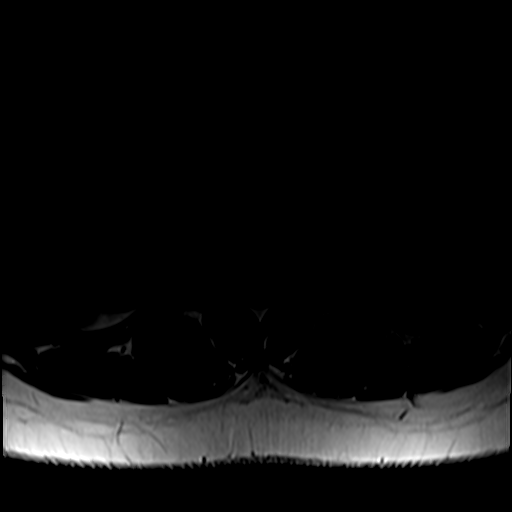

[26 of 48 positions shown; findings below may reference images not displayed]

FINDINGS: Segmentation: Standard. Lowest well-formed disc space labeled the
L5-S1 level.

Alignment: Physiologic with preservation of the normal lumbar
lordosis. No listhesis.

Vertebrae: Vertebral body height maintained without acute or chronic
fracture. Bone marrow signal intensity within normal limits. No
discrete or worrisome osseous lesions. No abnormal marrow edema.

Conus medullaris and cauda equina: Conus extends to the L1 level.
Conus and cauda equina appear normal.

Paraspinal and other soft tissues: Unremarkable.

Disc levels:

L1-2:  Unremarkable.

L2-3:  Unremarkable.

L3-4:  Unremarkable.

L4-5: Mild diffuse disc bulge with disc desiccation. Superimposed
small central disc protrusion with annular fissure. Minimal facet
hypertrophy. Resultant mild narrowing of the lateral recesses
bilaterally. Foramina remain patent.

L5-S1: Diffuse disc bulge with disc desiccation. Associated mild
reactive endplate spurring. Superimposed tiny central to right
subarticular disc protrusion with inferior migration (series 6,
image 38). Associated annular fissure. Disc material closely
approximates the descending right S1 nerve root, which could
potentially be affected. No significant spinal stenosis. Mild
bilateral L5 foraminal narrowing.
IMPRESSION: 1. Tiny central to right subarticular disc protrusion with inferior
migration at L5-S1, closely approximating and potentially irritating
the descending right S1 nerve root.
2. Small central disc protrusion at L4-5 with resultant mild
bilateral lateral recess stenosis.

## 2020-08-03 ENCOUNTER — Other Ambulatory Visit: Payer: 59

## 2020-09-03 ENCOUNTER — Ambulatory Visit (HOSPITAL_COMMUNITY): Payer: Self-pay | Admitting: Licensed Clinical Social Worker

## 2020-11-05 ENCOUNTER — Ambulatory Visit (INDEPENDENT_AMBULATORY_CARE_PROVIDER_SITE_OTHER): Payer: 59 | Admitting: Licensed Clinical Social Worker

## 2020-11-05 ENCOUNTER — Other Ambulatory Visit: Payer: Self-pay

## 2020-11-05 DIAGNOSIS — F902 Attention-deficit hyperactivity disorder, combined type: Secondary | ICD-10-CM

## 2020-11-05 DIAGNOSIS — F411 Generalized anxiety disorder: Secondary | ICD-10-CM | POA: Insufficient documentation

## 2020-11-05 DIAGNOSIS — F4310 Post-traumatic stress disorder, unspecified: Secondary | ICD-10-CM | POA: Insufficient documentation
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
## 2020-12-14 ENCOUNTER — Other Ambulatory Visit: Payer: Self-pay

## 2020-12-14 ENCOUNTER — Emergency Department (HOSPITAL_COMMUNITY)
Admission: EM | Admit: 2020-12-14 | Discharge: 2020-12-14 | Disposition: A | Discharge status: Home / Self Care | Payer: 59 | Attending: Emergency Medicine | Admitting: Emergency Medicine

## 2020-12-14 DIAGNOSIS — J069 Acute upper respiratory infection, unspecified: Secondary | ICD-10-CM | POA: Insufficient documentation

## 2020-12-14 DIAGNOSIS — I10 Essential (primary) hypertension: Secondary | ICD-10-CM | POA: Insufficient documentation

## 2020-12-14 DIAGNOSIS — Z79899 Other long term (current) drug therapy: Secondary | ICD-10-CM | POA: Insufficient documentation

## 2020-12-14 DIAGNOSIS — Z20822 Contact with and (suspected) exposure to covid-19: Secondary | ICD-10-CM | POA: Diagnosis not present

## 2020-12-14 DIAGNOSIS — J029 Acute pharyngitis, unspecified: Secondary | ICD-10-CM | POA: Diagnosis present

## 2020-12-14 LAB — RESP PANEL BY RT-PCR (FLU A&B, COVID) ARPGX2
Influenza A by PCR: NEGATIVE
Influenza B by PCR: NEGATIVE
SARS Coronavirus 2 by RT PCR: NEGATIVE

## 2020-12-14 MED ORDER — DEXAMETHASONE SODIUM PHOSPHATE 10 MG/ML IJ SOLN
8.0000 mg | Freq: Once | INTRAMUSCULAR | Status: AC
  Administered 2020-12-14: 8 mg via INTRAMUSCULAR
  Filled 2020-12-14: qty 1

## 2020-12-14 NOTE — ED Triage Notes (Signed)
Patient complains of throat tightness and irritation, has been taking amoxicillin for a tooth extraction, has been on that for about 1 week. Went to PCP on Sunday for these symptoms and they stated that it was viral. Denies lip swelling, facial swelling or rashes. Has a cough. Intermittent chest pain/ irritation. Feels fatigued.

## 2020-12-14 NOTE — Discharge Instructions (Addendum)
Please continue to take Tylenol and ibuprofen as needed for your sore throat.  I would recommend hot tea with honey for your sore throat as well.  I would also recommend salt water gargles.  Please continue to monitor your symptoms closely.  If they have not improved next week I would recommend following up with your regular doctor.  If you develop any new or worsening symptoms please come back to the emergency department.  It was a pleasure to meet you.

## 2020-12-14 NOTE — ED Provider Notes (Signed)
[facility]-EMERGENCY DEPT Provider Note   CSN: [id number] Arrival date & time: 12/14/20  1456     History No chief complaint on file.   Laquenta Beverlie Kurihara is a 40 y.o. female.  HPI  Wava Alizeh Madril , a 40 y.o. female  who complains of sore throat.  Patient states that for the past week she has been experiencing throat irritation, rhinorrhea, cough that is particularly worse at night.  She states she was evaluated about 6 days ago at a walk-in clinic and had a negative test for COVID-19 as well as rapid strep.  She states that she was placed on prednisone which she took with improvement of her sore throat but after completing this medication her symptoms returned.  She also notes that she had a right upper dental extraction about 10 days ago.  Her dentist put her on amoxicillin which she had been taking 3 times daily.  She discontinued the medication after 2 doses yesterday because of her current symptoms and her concern that her throat irritation could be a reaction to the amoxicillin.  She states she has been vaccinated for COVID-19 x2 and denies a known history of previous COVID-19 infection.  No shortness of breath, abdominal pain, nausea, vomiting, diarrhea.     Past Medical History:  Diagnosis Date   Allergy    Anxiety    Depression    Hypertension     Patient Active Problem List   Diagnosis Date Noted   PTSD (post-traumatic stress disorder) 11/05/2020   GAD (generalized anxiety disorder) 11/05/2020   Attention deficit hyperactivity disorder (ADHD), combined type 11/05/2020   History of iron deficiency anemia 11/02/2017   Essential hypertension 12/03/2016   Healthcare maintenance 11/17/2016   Dysuria 11/17/2016    Past Surgical History:  Procedure Laterality Date   CHOLECYSTECTOMY       OB History   No obstetric history on file.     Family History  Problem Relation Age of Onset   Diabetes Mother    Hyperlipidemia Mother     Hypertension Mother    Intellectual disability Mother    Diabetes Father    Hyperlipidemia Father    Hypertension Father    Intellectual disability Father    Diabetes Sister    Intellectual disability Maternal Grandmother    Cancer Maternal Grandfather    Stroke Maternal Grandfather    Diabetes Maternal Grandfather    Intellectual disability Paternal Grandmother    Heart disease Paternal Grandmother    Cancer Paternal Grandmother    Heart disease Paternal Grandfather    Hyperlipidemia Sister     Social History   Tobacco Use   Smoking status: Never   Smokeless tobacco: Never  Vaping Use   Vaping Use: Never used  Substance Use Topics   Alcohol use: Not Currently   Drug use: Never    Home Medications Prior to Admission medications   Medication Sig Start Date End Date Taking? Authorizing Provider  acetaminophen (TYLENOL) 325 MG tablet Take 2 tablets (650 mg total) by mouth every 6 (six) hours as needed. 12/16/19   Darr, Gerilyn Pilgrim, PA-C  dextromethorphan (COUGH DM) 30 MG/5ML liquid Take 60 mg by mouth every 12 (twelve) hours as needed for cough.    [provider]  famotidine (PEPCID) 20 MG tablet Take 1 tablet (20 mg total) by mouth 2 (two) times daily. 12/16/19   Darr, Gerilyn Pilgrim, PA-C  fluticasone (FLONASE) 50 MCG/ACT nasal spray Place 1 spray into both nostrils daily as  needed for allergies or rhinitis. 12/16/19   Darr, Gerilyn Pilgrim, PA-C  hydrochlorothiazide (HYDRODIURIL) 25 MG tablet Take 25 mg by mouth 2 (two) times daily.    [provider]  ibuprofen (ADVIL) 600 MG tablet Take 1 tablet (600 mg total) by mouth every 6 (six) hours as needed. 12/16/19   Darr, Gerilyn Pilgrim, PA-C  loratadine (CLARITIN) 10 MG tablet Take 10 mg by mouth daily.    [provider]  ondansetron (ZOFRAN) 4 MG tablet Take 1 tablet (4 mg total) by mouth every 8 (eight) hours as needed for nausea or vomiting. 12/16/19   Darr, Gerilyn Pilgrim, PA-C    Allergies    Patient has no known allergies.  Review of  Systems   Review of Systems  Constitutional:  Positive for fatigue. Negative for fever.  HENT:  Positive for congestion, postnasal drip and sore throat. Negative for trouble swallowing and voice change.   Respiratory:  Positive for cough. Negative for shortness of breath.   Cardiovascular:  Negative for chest pain.  Gastrointestinal:  Negative for abdominal pain, diarrhea, nausea and vomiting.   Physical Exam Updated Vital Signs BP (!) 139/96 (BP Location: Left Arm)   Pulse 81   Temp 99.1 F (37.3 C) (Oral)   Resp 16   Ht 5\' 4"  (1.626 m)   Wt 104.3 kg   SpO2 96%   BMI 39.48 kg/m   Physical Exam Vitals and nursing note reviewed.  Constitutional:      General: She is not in acute distress.    Appearance: Normal appearance. She is not ill-appearing, toxic-appearing or diaphoretic.  HENT:     Head: Normocephalic and atraumatic.     Right Ear: External ear normal.     Left Ear: External ear normal.     Nose: Nose normal.     Mouth/Throat:     Mouth: Mucous membranes are moist.     Pharynx: Oropharynx is clear. No oropharyngeal exudate or posterior oropharyngeal erythema.     Comments: Uvula midline.  No significant erythema noted in the posterior oropharynx.  Readily handling secretions.  No hot potato voice.  Site of recent right upper molar extraction appears to be healing well.  No erythema, tenderness, or fluctuance at the site. Eyes:     Extraocular Movements: Extraocular movements intact.  Neck:     Comments: No cervical adenopathy appreciated. Cardiovascular:     Rate and Rhythm: Normal rate and regular rhythm.     Pulses: Normal pulses.     Heart sounds: Normal heart sounds. No murmur heard.   No friction rub. No gallop.  Pulmonary:     Effort: Pulmonary effort is normal. No respiratory distress.     Breath sounds: Normal breath sounds. No stridor. No wheezing, rhonchi or rales.     Comments: Lungs are clear to auscultation bilaterally. Abdominal:     General:  Abdomen is flat.     Palpations: Abdomen is soft.     Tenderness: There is no abdominal tenderness.     Comments: Protuberant abdomen that is soft and nontender.  Musculoskeletal:        General: Normal range of motion.     Cervical back: Normal range of motion and neck supple. No tenderness.  Lymphadenopathy:     Cervical: No cervical adenopathy.  Skin:    General: Skin is warm and dry.  Neurological:     General: No focal deficit present.     Mental Status: She is alert and oriented to person, place,  and time.  Psychiatric:        Mood and Affect: Mood normal.        Behavior: Behavior normal.   ED Results / Procedures / Treatments   Labs (all labs ordered are listed, but only abnormal results are displayed) Labs Reviewed  RESP PANEL BY RT-PCR (FLU A&B, COVID) [id number]    EKG None  Radiology No results found.  Procedures Procedures   Medications Ordered in ED Medications  dexamethasone (DECADRON) injection 8 mg (has no administration in time range)    ED Course  I have reviewed the triage vital signs and the nursing notes.  Pertinent labs & imaging results that were available during my care of the patient were reviewed by me and considered in my medical decision making (see chart for details).    MDM Rules/Calculators/A&P                          Pt is a 40 y.o. female who presents to the emergency department with symptoms consistent with a viral URI.  Labs: Respiratory panel is negative.  I, Placido Sou, PA-C, personally reviewed and evaluated these images and lab results as part of my medical decision-making.  Patient states she was evaluated in a walk-in clinic about 6 days ago and was tested for COVID-19 as well as strep.  Both tests were negative.  I repeated her COVID-19 test today which was once again negative.  Patient states she completed a course of prednisone which improved her sore throat.  We will give her a dose of Decadron here in the  emergency department.  Recommended continued use of Tylenol and ibuprofen.  She recently stopped taking amoxicillin which she was given for right upper dental pain after she had an extraction 10 days ago.  There is no erythema, tenderness, or fluctuance in the region of her recent extraction.  No signs or symptoms of infection.  Feel the patient is stable for discharge at this time and she is agreeable.  Recommended PCP follow-up next week if her symptoms have not improved.  Return to the emergency department if they worsen.  Her questions were answered and she was amicable at the time of discharge.  Note: Portions of this report may have been transcribed using voice recognition software. Every effort was made to ensure accuracy; however, inadvertent computerized transcription errors may be present.   Final Clinical Impression(s) / ED Diagnoses Final diagnoses:  Viral URI   Rx / DC Orders ED Discharge Orders     None        Placido Sou, PA-C 12/14/20 1729    Shon Baton, MD 12/15/20 2340

## 2020-12-14 NOTE — ED Provider Notes (Signed)
Emergency Medicine Provider Triage Evaluation Note  Angie Roberts , a 40 y.o. female  was evaluated in triage.  Pt complains of sore throat.  Patient states that for the past week she has been experiencing throat irritation, rhinorrhea, cough that is particularly worse at night.  She states she was evaluated about 6 days ago at a walk-in clinic and had a negative test for COVID-19 as well as rapid strep.  She also notes that she had a right upper dental extraction about 10 days ago.  Her dentist put her on amoxicillin which she had been taking 3 times daily.  She discontinued the medication after 2 doses yesterday because of her current symptoms and her concerned that her throat irritation could be a reaction to the amoxicillin.  She states she has been vaccinated for COVID-19 x2 and denies a known history of previous COVID-19 infection.  Physical Exam  BP (!) 139/96 (BP Location: Left Arm)   Pulse 81   Temp 99.1 F (37.3 C) (Oral)   Resp 16   Ht 5\' 4"  (1.626 m)   Wt 104.3 kg   SpO2 96%   BMI 39.48 kg/m  Gen:   Awake, no distress   Resp:  Normal effort  MSK:   Moves extremities without difficulty  Other:    Medical Decision Making  Medically screening exam initiated at 3:55 PM.  Appropriate orders placed.  Angie Roberts was informed that the remainder of the evaluation will be completed by another provider, this initial triage assessment does not replace that evaluation, and the importance of remaining in the ED until their evaluation is complete.   Jackquline Bosch, PA-C 12/14/20 1556    [place], 12/16/20, MD 12/15/20 2340

## 2020-12-19 ENCOUNTER — Other Ambulatory Visit: Payer: Self-pay | Admitting: Family Medicine

## 2020-12-19 ENCOUNTER — Ambulatory Visit
Admission: RE | Admit: 2020-12-19 | Discharge: 2020-12-19 | Disposition: A | Discharge status: Home / Self Care | Payer: 59 | Source: Ambulatory Visit | Attending: Family Medicine | Admitting: Family Medicine

## 2020-12-19 DIAGNOSIS — R0789 Other chest pain: Secondary | ICD-10-CM

## 2020-12-19 IMAGING — CR DG CHEST 2V
2 series · 2 of 2 positions shown · non-contrast
Comparison: [DATE]

CLINICAL DATA: Chest tightness

EXAM:
CHEST - 2 VIEW

[w chest pa]
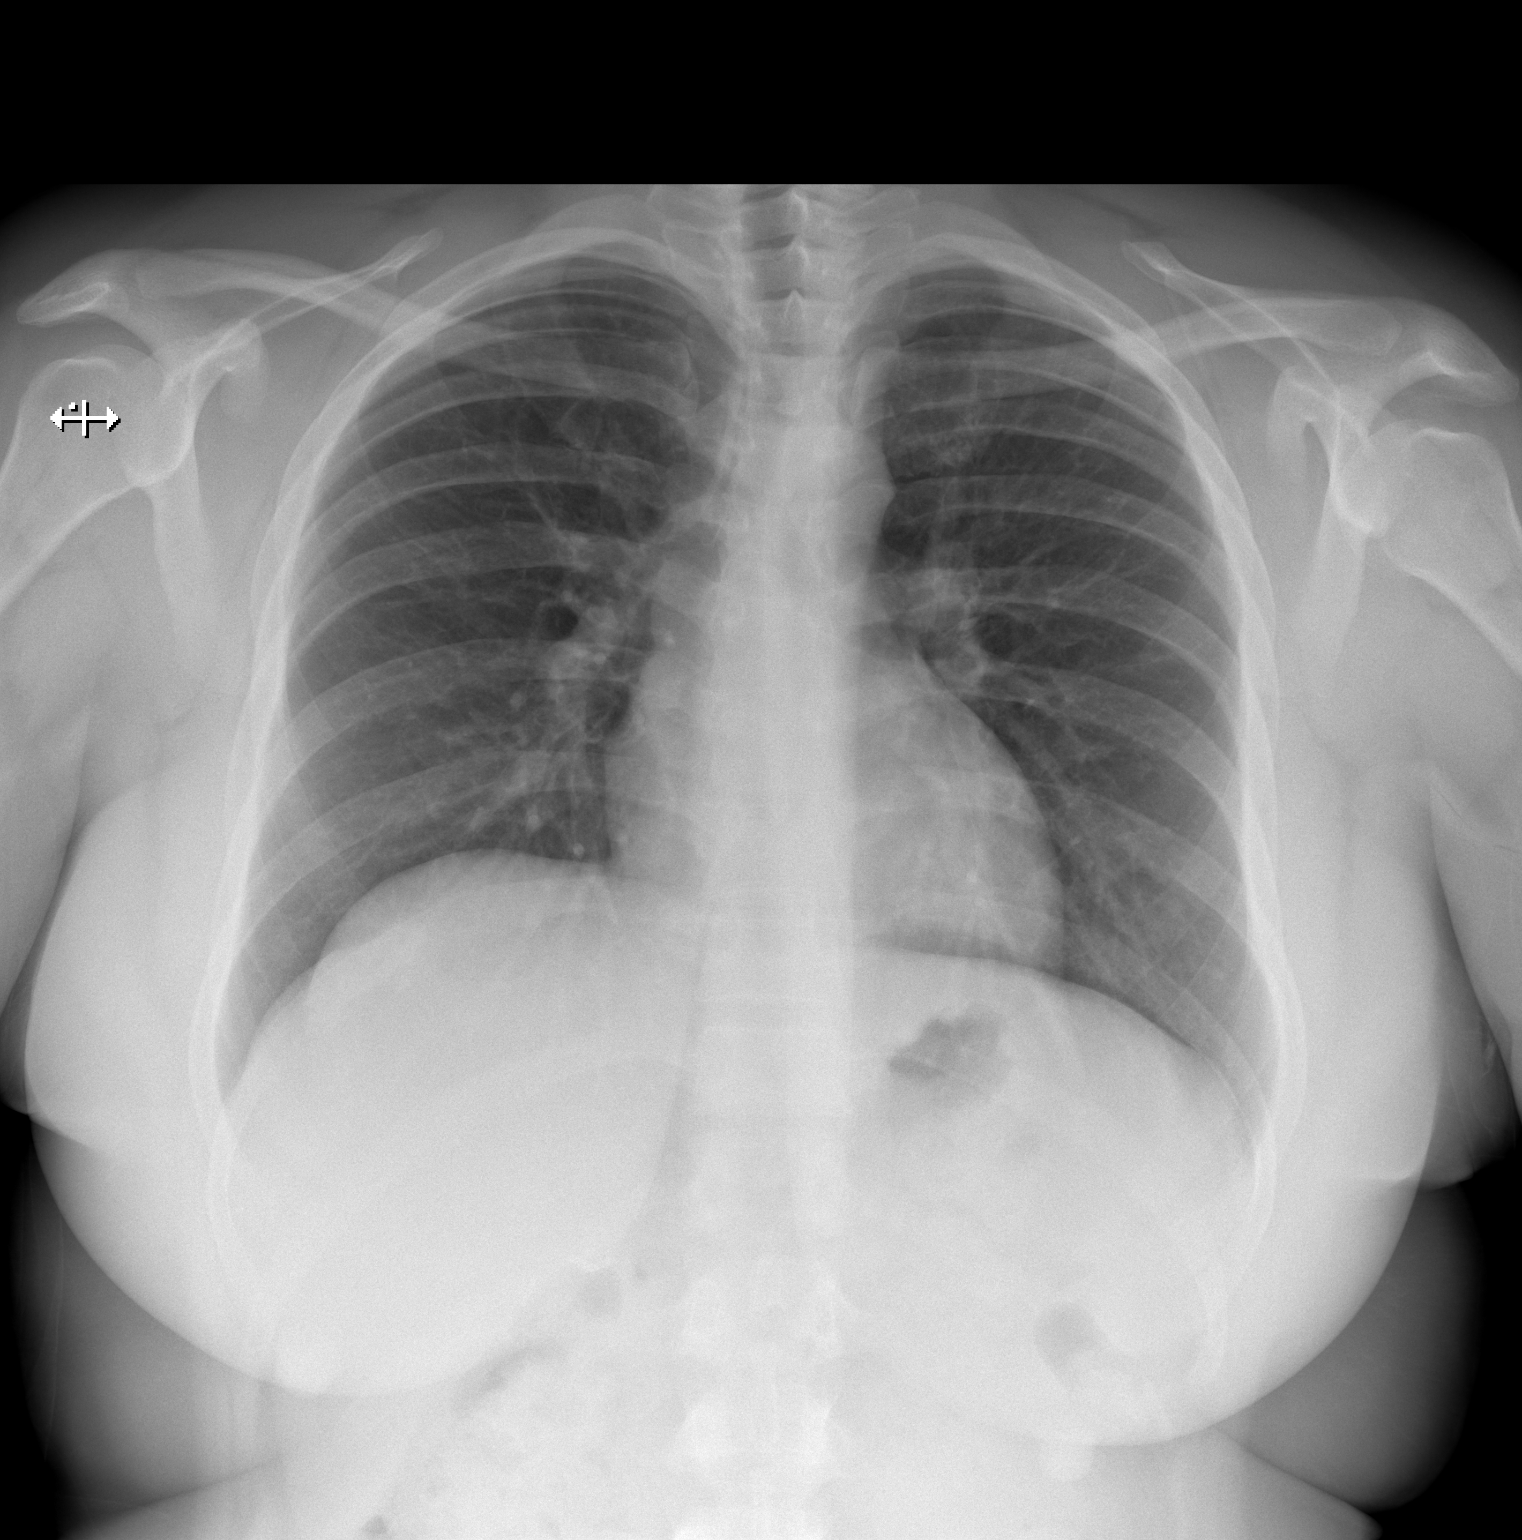

[w chest lat]
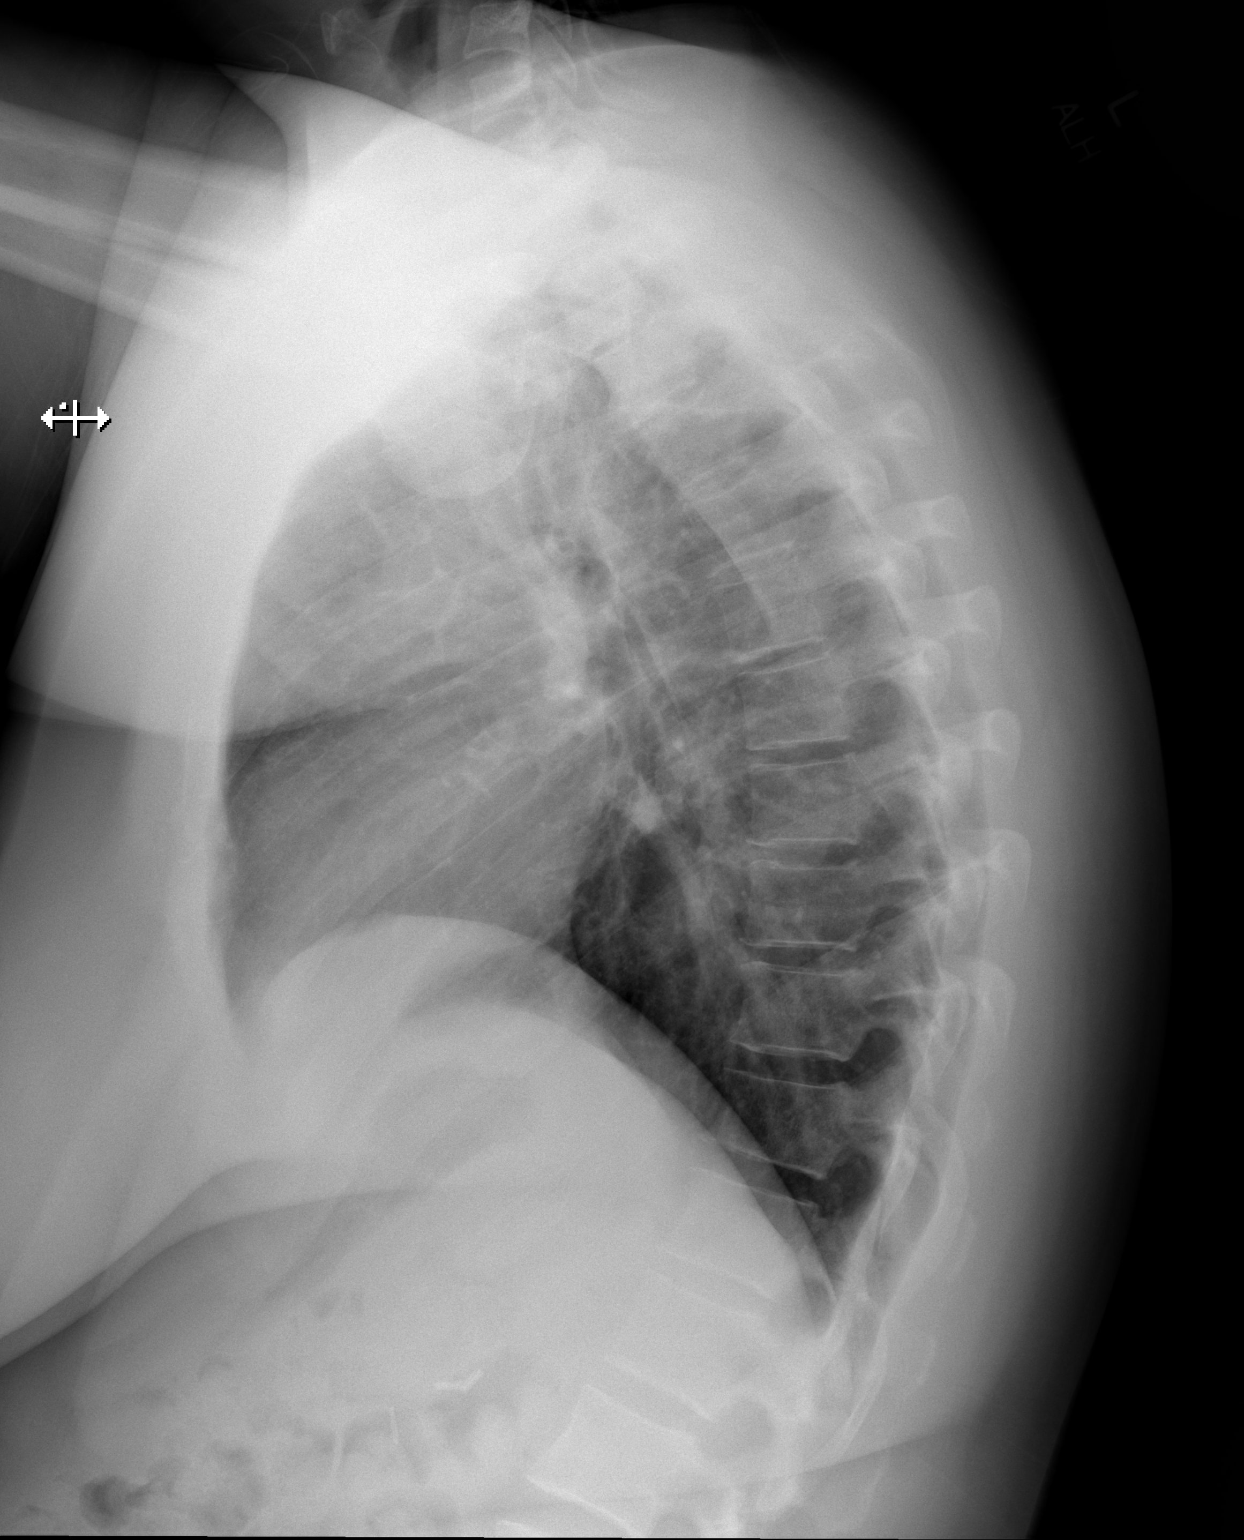

[2 of 2 positions shown; findings below may reference images not displayed]

FINDINGS: The heart size and mediastinal contours are within normal limits.
Both lungs are clear. The visualized skeletal structures are
unremarkable.
IMPRESSION: Negative.

## 2020-12-20 ENCOUNTER — Other Ambulatory Visit: Payer: Self-pay

## 2020-12-20 ENCOUNTER — Encounter (HOSPITAL_COMMUNITY): Payer: Self-pay | Admitting: Family

## 2020-12-20 ENCOUNTER — Ambulatory Visit (HOSPITAL_COMMUNITY): Payer: 59 | Admitting: Physician Assistant

## 2020-12-20 ENCOUNTER — Ambulatory Visit (INDEPENDENT_AMBULATORY_CARE_PROVIDER_SITE_OTHER): Payer: 59 | Admitting: Family

## 2020-12-20 DIAGNOSIS — F4310 Post-traumatic stress disorder, unspecified: Secondary | ICD-10-CM

## 2020-12-20 DIAGNOSIS — F411 Generalized anxiety disorder: Secondary | ICD-10-CM | POA: Diagnosis not present

## 2020-12-20 MED ORDER — HYDROXYZINE HCL 10 MG PO TABS
10.0000 mg | ORAL_TABLET | Freq: Three times a day (TID) | ORAL | 0 refills | Status: DC | PRN
Start: 2020-12-20 — End: 2021-01-17

## 2020-12-20 MED ORDER — ATOMOXETINE HCL 10 MG PO CAPS
10.0000 mg | ORAL_CAPSULE | Freq: Every day | ORAL | 0 refills | Status: DC
Start: 2020-12-20 — End: 2021-01-17

## 2020-12-20 NOTE — Progress Notes (Signed)
Virtual Visit via Telephone Note  I connected with Angie Roberts on 12/20/20 at 11:15 AM EDT by telephone and verified that I am speaking with the correct person using two identifiers.  Location: Patient: Home Provider: Office   I discussed the limitations, risks, security and privacy concerns of performing an evaluation and management service by telephone and the availability of in person appointments. I also discussed with the patient that there may be a patient responsible charge related to this service. The patient expressed understanding and agreed to proceed.  I discussed the assessment and treatment plan with the patient. The patient was provided an opportunity to ask questions and all were answered. The patient agreed with the plan and demonstrated an understanding of the instructions.   The patient was advised to call back or seek an in-person evaluation if the symptoms worsen or if the condition fails to improve as anticipated.  I provided 15 minutes of non-face-to-face time during this encounter.   Angie Rack, NP    Psychiatric Initial Adult Assessment   Patient Identification: Angie Roberts MRN:  [id number] Date of Evaluation:  12/20/2020 Referral Source: walkin  Chief Complaint:  Anxeity/ Depression  Visit Diagnosis: No diagnosis found.  History of Present Illness: Angie Roberts 40 year old female presents to establish care.  She reported she recently had a psychological examination due to her social anxiety depression and poor concentration.  States her symptoms started 1 year prior.  she reports she was diagnosed with attention deficit disorder and posttraumatic stress disorder.  She reports " I thought I had autism."  States she has a difficult time with concentration and focus.  States she has been unable to hold a job due to her comprehension.  States she is connected with vocational rehab and is currently employed by [employer].   Reports minimal customer engagement due to her social anxiety.  Patient reports being introverted.  States she states in her home most of the day.   She denies that she has been followed by therapy or psychiatry consistently.  Reports she is followed by her primary care provider at [facility] family physician where she is currently prescribed Zoloft 60 mg.  States she is currently prescribed gabapentin 100 mg BID, and prednisone due to a recurrent mono diagnosis.   Angie Roberts denies previous inpatient admissions.  Denies previous suicide attempts, plan or intent.  Reports good appetite.  States she is rested well throughout the night.  Reports a family history with mental illness.  Father: Substance abuse " other mental illness" possibly bipolar disorder.  Mother: Anxiety.  Paternal aunt: Sex addiction.   Denied history with seizure disorders.  Reports chronic headaches denied that she is currently followed by neurology.  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   States she is [facility] where she is seeing therapist Angie Roberts.  Who is been working on her "attachment issues "while in relationships.  "  I get really obsessed with people."  She denied any illicit drug use or substance abuse history. Discussed initiating hydroxyzine for anxiety, and Strattera 10 mg daily for concentration and focus continue Zoloft 60 mg daily.  Patient to follow-up in 1 month.  Patient was receptive to plan.  Support,  encouragement and reassurance was provided.  Associated Signs/Symptoms: Depression Symptoms:  depressed mood, feelings of worthlessness/guilt, difficulty concentrating, hopelessness, anxiety, (Hypo) Manic Symptoms:  Elevated Mood,  Irritable Mood, Anxiety Symptoms:  Excessive Worry, Psychotic Symptoms:   N/A PTSD Symptoms: Reported difficultly childhood. " People are mean and  rude."  -  I am trouble maker.   Past Psychiatric History:   Previous Psychotropic Medications: No   Substance Abuse History in the last 12 months:  No.  Consequences of Substance Abuse: NA  Past Medical History:  Past Medical History:  Diagnosis Date   Allergy    Anxiety    Depression    Hypertension     Past Surgical History:  Procedure Laterality Date   CHOLECYSTECTOMY      Family Psychiatric History:   Family History:  Family History  Problem Relation Age of Onset   Diabetes Mother    Hyperlipidemia Mother    Hypertension Mother    Intellectual disability Mother    Diabetes Father    Hyperlipidemia Father    Hypertension Father    Intellectual disability Father    Diabetes Sister    Intellectual disability Maternal Grandmother    Cancer Maternal Grandfather    Stroke Maternal Grandfather    Diabetes Maternal Grandfather    Intellectual disability Paternal Grandmother    Heart disease Paternal Grandmother    Cancer Paternal Grandmother    Heart disease Paternal Grandfather    Hyperlipidemia Sister     Social History:   Social History   Socioeconomic History   Marital status: Single    Spouse name: Not on file   Number of children: Not on file   Years of education: Not on file   Highest education level: Not on file  Occupational History   Not on file  Tobacco Use   Smoking status: Never   Smokeless tobacco: Never  Vaping Use   Vaping Use: Never used  Substance and Sexual Activity   Alcohol use: Not Currently   Drug use: Never   Sexual activity: Not Currently  Other Topics Concern   Not on file  Social History Narrative   Not on file   Social Determinants of Health   Financial Resource Strain: Low Risk    Difficulty of Paying Living Expenses: Not very hard  Food Insecurity: No Food Insecurity   Worried About Programme researcher, broadcasting/film/video in the Last Year: Never true   Ran Out of Food in the Last Year: Never true  Transportation Needs: Unmet  Transportation Needs   Lack of Transportation (Medical): Yes   Lack of Transportation (Non-Medical): Yes  Physical Activity: Inactive   Days of Exercise per Week: 0 days   Minutes of Exercise per Session: 0 min  Stress: Stress Concern Present   Feeling of Stress : To some extent  Social Connections: Socially Isolated   Frequency of Communication with Friends and Family: More than three times a week   Frequency of Social Gatherings with Friends and Family: Twice a week   Attends Religious Services: Never   Database administrator or Organizations: No   Attends Banker Meetings: Never   Marital Status: Never married    Additional Social History:   Allergies:  No Known Allergies  Metabolic Disorder Labs: Lab Results  Component Value Date   HGBA1C 6.1 02/27/2013   No results found for: PROLACTIN Lab Results  Component Value Date   CHOL 182 11/19/2016   TRIG 96 11/19/2016   HDL 48 11/19/2016   CHOLHDL 3.8 11/19/2016   VLDL 20 02/27/2013   LDLCALC 115 (H) 11/19/2016   LDLCALC 134 (H) 02/27/2013  Lab Results  Component Value Date   TSH 2.850 11/19/2016    Therapeutic Level Labs: No results found for: LITHIUM No results found for: CBMZ No results found for: VALPROATE  Current Medications: Current Outpatient Medications  Medication Sig Dispense Refill   acetaminophen (TYLENOL) 325 MG tablet Take 2 tablets (650 mg total) by mouth every 6 (six) hours as needed. 30 tablet 0   dextromethorphan (COUGH DM) 30 MG/5ML liquid Take 60 mg by mouth every 12 (twelve) hours as needed for cough.     famotidine (PEPCID) 20 MG tablet Take 1 tablet (20 mg total) by mouth 2 (two) times daily. 30 tablet 0   fluticasone (FLONASE) 50 MCG/ACT nasal spray Place 1 spray into both nostrils daily as needed for allergies or rhinitis. 15.8 mL 0   hydrochlorothiazide (HYDRODIURIL) 25 MG tablet Take 25 mg by mouth 2 (two) times daily.     ibuprofen (ADVIL) 600 MG tablet Take 1 tablet (600  mg total) by mouth every 6 (six) hours as needed. 30 tablet 0   loratadine (CLARITIN) 10 MG tablet Take 10 mg by mouth daily.     ondansetron (ZOFRAN) 4 MG tablet Take 1 tablet (4 mg total) by mouth every 8 (eight) hours as needed for nausea or vomiting. 8 tablet 0   No current facility-administered medications for this visit.    Musculoskeletal:  Psychiatric Specialty Exam: Review of Systems  There were no vitals taken for this visit.There is no height or weight on file to calculate BMI.  General Appearance: Casual  Eye Contact:  NA  Speech:  Clear and Coherent  Volume:  Normal  Mood:  Anxious and Depressed  Affect:  Congruent  Thought Process:  Coherent  Orientation:  Full (Time, Place, and Person)  Thought Content:  Logical  Suicidal Thoughts:  No  Homicidal Thoughts:  No  Memory:  Immediate;   Fair Recent;   Fair  Judgement:  Fair  Insight:  Good  Psychomotor Activity:  Normal  Concentration:  Concentration: Good  Recall:  Good  Fund of Knowledge:Good  Language: Good  Akathisia:  No  Handed:  Right  AIMS (if indicated):  done  Assets:  Communication Skills Resilience Social Support  ADL's:  Intact  Cognition: WNL  Sleep:  Fair   Screenings: Insurance account manager from 11/05/2020 in [facility] Office Visit from 11/02/2017 in Primary Care at [facility] Total Score 3 0  PHQ-9 Total Score 13 --      Flowsheet Row ED from 12/14/2020 in [facility] Counselor from 11/05/2020 in [facility] ED from 07/10/2020 in [facility]  C-SSRS RISK CATEGORY No Risk No Risk Error: Question 2 not populated       Assessment and Plan:  Elisea Khader is a 40 year old female presents to establish care.  She reports a history of posttraumatic stress disorder and social anxiety.  Patient's states her primary care provider prescribes Zoloft 60 mg  daily.  Discussed initiating hydroxyzine and Strattera for concentration and mood stabilization.  Patient was receptive to plan.  Denied any previous inpatient admissions or suicidal attempts.  Anxiety/depression Continue Zoloft 60 mg daily Initiated hydroxyzine 10 mg p.o. 3 times daily as needed Initiated Strattera 10 mg p.o. daily  Patient to follow-up 4 weeks for medication adherence/side effects   Angie Rack, NP 8/5/202211:15 AM

## 2021-01-14 ENCOUNTER — Other Ambulatory Visit (HOSPITAL_COMMUNITY): Payer: Self-pay | Admitting: Family

## 2021-01-16 ENCOUNTER — Other Ambulatory Visit (HOSPITAL_COMMUNITY): Payer: Self-pay | Admitting: Family

## 2021-01-17 ENCOUNTER — Telehealth (INDEPENDENT_AMBULATORY_CARE_PROVIDER_SITE_OTHER): Payer: 59 | Admitting: Physician Assistant

## 2021-01-17 ENCOUNTER — Encounter (HOSPITAL_COMMUNITY): Payer: Self-pay | Admitting: Physician Assistant

## 2021-01-17 DIAGNOSIS — F902 Attention-deficit hyperactivity disorder, combined type: Secondary | ICD-10-CM

## 2021-01-17 DIAGNOSIS — F4310 Post-traumatic stress disorder, unspecified: Secondary | ICD-10-CM

## 2021-01-17 DIAGNOSIS — F411 Generalized anxiety disorder: Secondary | ICD-10-CM | POA: Diagnosis not present

## 2021-01-17 MED ORDER — ATOMOXETINE HCL 25 MG PO CAPS: 25.0000 mg | ORAL_CAPSULE | Freq: Every day | ORAL | 1 refills | Status: DC

## 2021-01-17 MED ORDER — SERTRALINE HCL 100 MG PO TABS: 100.0000 mg | ORAL_TABLET | Freq: Every day | ORAL | 1 refills | Status: DC

## 2021-01-17 MED ORDER — HYDROXYZINE HCL 10 MG PO TABS: 10.0000 mg | ORAL_TABLET | Freq: Three times a day (TID) | ORAL | 0 refills | Status: DC | PRN

## 2021-01-17 MED ORDER — CLONAZEPAM 0.5 MG PO TABS: 0.2500 mg | ORAL_TABLET | Freq: Every day | ORAL | 0 refills | Status: DC

## 2021-01-17 NOTE — Progress Notes (Signed)
BH MD/PA/NP OP Progress Note  Virtual Visit via Telephone Note  I connected with Angie Roberts on 01/17/21 at 10:00 AM EDT by telephone and verified that I am speaking with the correct person using two identifiers.  Location: Patient: Home Provider: Clinic   I discussed the limitations, risks, security and privacy concerns of performing an evaluation and management service by telephone and the availability of in person appointments. I also discussed with the patient that there may be a patient responsible charge related to this service. The patient expressed understanding and agreed to proceed.  Follow Up Instructions:  I discussed the assessment and treatment plan with the patient. The patient was provided an opportunity to ask questions and all were answered. The patient agreed with the plan and demonstrated an understanding of the instructions.   The patient was advised to call back or seek an in-person evaluation if the symptoms worsen or if the condition fails to improve as anticipated.  I provided 20 minutes of non-face-to-face time during this encounter.  Meta Hatchet, PA    01/17/2021 9:36 PM Angie Roberts  MRN:  [id number]  Chief Complaint: Follow up and medication management  HPI:     Visit Diagnosis:    ICD-10-CM   1. PTSD (post-traumatic stress disorder)  F43.10 sertraline (ZOLOFT) 100 MG tablet    2. Attention deficit hyperactivity disorder (ADHD), combined type  F90.2 atomoxetine (STRATTERA) 25 MG capsule    sertraline (ZOLOFT) 100 MG tablet    3. GAD (generalized anxiety disorder)  F41.1 clonazePAM (KLONOPIN) 0.5 MG tablet    sertraline (ZOLOFT) 100 MG tablet    hydrOXYzine (ATARAX/VISTARIL) 10 MG tablet      Past Psychiatric History:  PTSD Attention deficit hyperactivity disorder, combined type Generalized anxiety disorder  Past Medical History:  Past Medical History:  Diagnosis Date   Allergy    Anxiety    Depression     Hypertension     Past Surgical History:  Procedure Laterality Date   CHOLECYSTECTOMY      Family Psychiatric History:  Unknown  Family History:  Family History  Problem Relation Age of Onset   Diabetes Mother    Hyperlipidemia Mother    Hypertension Mother    Intellectual disability Mother    Diabetes Father    Hyperlipidemia Father    Hypertension Father    Intellectual disability Father    Diabetes Sister    Intellectual disability Maternal Grandmother    Cancer Maternal Grandfather    Stroke Maternal Grandfather    Diabetes Maternal Grandfather    Intellectual disability Paternal Grandmother    Heart disease Paternal Grandmother    Cancer Paternal Grandmother    Heart disease Paternal Grandfather    Hyperlipidemia Sister     Social History:  Social History   Socioeconomic History   Marital status: Single    Spouse name: Not on file   Number of children: Not on file   Years of education: Not on file   Highest education level: Not on file  Occupational History   Not on file  Tobacco Use   Smoking status: Never   Smokeless tobacco: Never  Vaping Use   Vaping Use: Never used  Substance and Sexual Activity   Alcohol use: Not Currently   Drug use: Never   Sexual activity: Not Currently  Other Topics Concern   Not on file  Social History Narrative   Not on file   Social Determinants of Health   Financial  Resource Strain: Low Risk    Difficulty of Paying Living Expenses: Not very hard  Food Insecurity: No Food Insecurity   Worried About Programme researcher, broadcasting/film/video in the Last Year: Never true   Ran Out of Food in the Last Year: Never true  Transportation Needs: Unmet Transportation Needs   Lack of Transportation (Medical): Yes   Lack of Transportation (Non-Medical): Yes  Physical Activity: Inactive   Days of Exercise per Week: 0 days   Minutes of Exercise per Session: 0 min  Stress: Stress Concern Present   Feeling of Stress : To some extent  Social  Connections: Socially Isolated   Frequency of Communication with Friends and Family: More than three times a week   Frequency of Social Gatherings with Friends and Family: Twice a week   Attends Religious Services: Never   Database administrator or Organizations: No   Attends Engineer, structural: Never   Marital Status: Never married    Allergies: No Known Allergies  Metabolic Disorder Labs: Lab Results  Component Value Date   HGBA1C 6.1 02/27/2013   No results found for: PROLACTIN Lab Results  Component Value Date   CHOL 182 11/19/2016   TRIG 96 11/19/2016   HDL 48 11/19/2016   CHOLHDL 3.8 11/19/2016   VLDL 20 02/27/2013   LDLCALC 115 (H) 11/19/2016   LDLCALC 134 (H) 02/27/2013   Lab Results  Component Value Date   TSH 2.850 11/19/2016    Therapeutic Level Labs: No results found for: LITHIUM No results found for: VALPROATE No components found for:  CBMZ  Current Medications: Current Outpatient Medications  Medication Sig Dispense Refill   clonazePAM (KLONOPIN) 0.5 MG tablet Take 0.5 tablets (0.25 mg total) by mouth daily. 2 tablet 0   sertraline (ZOLOFT) 100 MG tablet Take 1 tablet (100 mg total) by mouth daily. 30 tablet 1   acetaminophen (TYLENOL) 325 MG tablet Take 2 tablets (650 mg total) by mouth every 6 (six) hours as needed. 30 tablet 0   atomoxetine (STRATTERA) 25 MG capsule Take 1 capsule (25 mg total) by mouth daily. 30 capsule 1   dextromethorphan (COUGH DM) 30 MG/5ML liquid Take 60 mg by mouth every 12 (twelve) hours as needed for cough.     famotidine (PEPCID) 20 MG tablet Take 1 tablet (20 mg total) by mouth 2 (two) times daily. 30 tablet 0   fluticasone (FLONASE) 50 MCG/ACT nasal spray Place 1 spray into both nostrils daily as needed for allergies or rhinitis. 15.8 mL 0   hydrochlorothiazide (HYDRODIURIL) 25 MG tablet Take 25 mg by mouth 2 (two) times daily.     hydrOXYzine (ATARAX/VISTARIL) 10 MG tablet Take 1 tablet (10 mg total) by mouth 3  (three) times daily as needed. 75 tablet 0   ibuprofen (ADVIL) 600 MG tablet Take 1 tablet (600 mg total) by mouth every 6 (six) hours as needed. 30 tablet 0   loratadine (CLARITIN) 10 MG tablet Take 10 mg by mouth daily.     ondansetron (ZOFRAN) 4 MG tablet Take 1 tablet (4 mg total) by mouth every 8 (eight) hours as needed for nausea or vomiting. 8 tablet 0   No current facility-administered medications for this visit.     Musculoskeletal: Strength & Muscle Tone: Unable to assess due to telemedicine visit Gait & Station: Unable to assess due to telemedicine visit Patient leans: Unable to assess due to telemedicine visit  Psychiatric Specialty Exam: Review of Systems  Psychiatric/Behavioral:  Positive for  decreased concentration. Negative for dysphoric mood, hallucinations, self-injury, sleep disturbance and suicidal ideas. The patient is nervous/anxious. The patient is not hyperactive.    There were no vitals taken for this visit.There is no height or weight on file to calculate BMI.  General Appearance: Unable to assess due to telemedicine visit  Eye Contact:  Unable to assess due to telemedicine visit  Speech:  Clear and Coherent and Normal Rate  Volume:  Normal  Mood:  Anxious and Euthymic  Affect:  Appropriate and Congruent  Thought Process:  Coherent, Goal Directed, and Descriptions of Associations: Intact  Orientation:  Full (Time, Place, and Person)  Thought Content: WDL   Suicidal Thoughts:  No  Homicidal Thoughts:  No  Memory:  Immediate;   Good Recent;   Fair Remote;   Fair  Judgement:  Good  Insight:  Good  Psychomotor Activity:  Normal  Concentration:  Concentration: Good and Attention Span: Good  Recall:  Good  Fund of Knowledge: Good  Language: Good  Akathisia:  No  Handed:  Right  AIMS (if indicated): not done  Assets:  Communication Skills Resilience Social Support  ADL's:  Intact  Cognition: WNL  Sleep:  Good   Screenings: GAD-7    Flowsheet Row  Video Visit from 01/17/2021 in [facility]  Total GAD-7 Score 12      PHQ2-9    Flowsheet Row Video Visit from 01/17/2021 in [facility] Counselor from 11/05/2020 in [facility] Office Visit from 11/02/2017 in Primary Care at [facility] Total Score 1 3 0  PHQ-9 Total Score -- 13 --      Flowsheet Row Video Visit from 01/17/2021 in [facility] ED from 12/14/2020 in [facility] Counselor from 11/05/2020 in [facility]  C-SSRS RISK CATEGORY No Risk No Risk No Risk        Assessment and Plan:     1. PTSD (post-traumatic stress disorder)  - sertraline (ZOLOFT) 100 MG tablet; Take 1 tablet (100 mg total) by mouth daily.  Dispense: 30 tablet; Refill: 1  2. Attention deficit hyperactivity disorder (ADHD), combined type  - atomoxetine (STRATTERA) 25 MG capsule; Take 1 capsule (25 mg total) by mouth daily.  Dispense: 30 capsule; Refill: 1 - sertraline (ZOLOFT) 100 MG tablet; Take 1 tablet (100 mg total) by mouth daily.  Dispense: 30 tablet; Refill: 1  3. GAD (generalized anxiety disorder)  - clonazePAM (KLONOPIN) 0.5 MG tablet; Take 0.5 tablets (0.25 mg total) by mouth daily.  Dispense: 2 tablet; Refill: 0 - sertraline (ZOLOFT) 100 MG tablet; Take 1 tablet (100 mg total) by mouth daily.  Dispense: 30 tablet; Refill: 1 - hydrOXYzine (ATARAX/VISTARIL) 10 MG tablet; Take 1 tablet (10 mg total) by mouth 3 (three) times daily as needed.  Dispense: 75 tablet; Refill: 0  Patient to follow up 6 weeks Provider spent a total of 20 minutes with the patient/reviewing patient's chart  Meta Hatchet, PA 01/17/2021, 9:36 PM

## 2021-02-18 ENCOUNTER — Other Ambulatory Visit (HOSPITAL_COMMUNITY): Payer: Self-pay | Admitting: Physician Assistant

## 2021-02-18 DIAGNOSIS — F411 Generalized anxiety disorder: Secondary | ICD-10-CM

## 2021-03-05 ENCOUNTER — Telehealth (INDEPENDENT_AMBULATORY_CARE_PROVIDER_SITE_OTHER): Payer: 59 | Admitting: Physician Assistant

## 2021-03-05 ENCOUNTER — Encounter (HOSPITAL_COMMUNITY): Payer: Self-pay | Admitting: Physician Assistant

## 2021-03-05 ENCOUNTER — Other Ambulatory Visit: Payer: Self-pay

## 2021-03-05 DIAGNOSIS — F411 Generalized anxiety disorder: Secondary | ICD-10-CM | POA: Diagnosis not present

## 2021-03-05 DIAGNOSIS — F902 Attention-deficit hyperactivity disorder, combined type: Secondary | ICD-10-CM

## 2021-03-05 DIAGNOSIS — F4310 Post-traumatic stress disorder, unspecified: Secondary | ICD-10-CM

## 2021-03-05 MED ORDER — SERTRALINE HCL 100 MG PO TABS: 100.0000 mg | ORAL_TABLET | Freq: Every day | ORAL | 1 refills | Status: DC

## 2021-03-05 MED ORDER — HYDROXYZINE HCL 10 MG PO TABS: ORAL_TABLET | ORAL | 0 refills | Status: DC

## 2021-03-05 MED ORDER — ATOMOXETINE HCL 40 MG PO CAPS: 40.0000 mg | ORAL_CAPSULE | Freq: Every day | ORAL | 1 refills | Status: DC

## 2021-03-05 NOTE — Progress Notes (Addendum)
BH MD/PA/NP OP Progress Note  Virtual Visit via Telephone Note  I connected with Angie Roberts on 03/05/21 at 11:30 AM EDT by telephone and verified that I am speaking with the correct person using two identifiers.  Location: Patient: Home Provider: Clinic   I discussed the limitations, risks, security and privacy concerns of performing an evaluation and management service by telephone and the availability of in person appointments. I also discussed with the patient that there may be a patient responsible charge related to this service. The patient expressed understanding and agreed to proceed.  Follow Up Instructions:  I discussed the assessment and treatment plan with the patient. The patient was provided an opportunity to ask questions and all were answered. The patient agreed with the plan and demonstrated an understanding of the instructions.   The patient was advised to call back or seek an in-person evaluation if the symptoms worsen or if the condition fails to improve as anticipated.  I provided 21 minutes of non-face-to-face time during this encounter.  Meta Hatchet, PA   03/05/2021 8:00 PM Angie Roberts  MRN:  [id number]  Chief Complaint: Follow up and medication management  HPI:   Angie Roberts is a 40 year old female with a past psychiatric history significant for generalized anxiety disorder, PTSD, and attention deficit hyperactivity disorder who presents to [facility] via virtual telephone visit.  Patient is currently being managed on the following medications:  Sertraline 100 mg daily Strattera 25 mg daily Hydroxyzine 10 mg 3 times daily as needed  Patient reports that she has been experiencing some chest pain and excessive thirst but is unsure if it is due to her medications.  Despite her current symptoms, patient reports no issues or concerns regarding her current medication regimen.  Patient  states that she is able to do her work without too much distraction.  Patient denies experiencing any depressive episodes.  Patient does experience anxiety which she rates a 2 or 3 out of 10.  Patient's current stressor includes family members just recently having surgery performed on them.  Patient reports that she is wanting to have an appointment set up with neurology due to her issues with her perception.  Patient believe that she has a problem with her perception of certain things and processing information.  Patient states that she is also struggling with the compulsion to look up information on things.  Patient reports no other issues or concerns at this time.  A GAD-7 screen was performed with the patient scoring a 9.  Patient is alert and oriented x4, calm, cooperative, and fully engaged in conversation during the encounter.  Patient endorses okay mood.  Patient denies suicidal or homicidal ideations.  She further denies auditory or visual hallucinations and does not appear to be responding to internal/external stimuli.  Patient endorses good sleep and receives on average 7 to 8 hours of sleep each night.  Patient endorses good appetite and eats on average 2 meals per day.  Patient denies alcohol consumption, tobacco use, and illicit drug use.  Visit Diagnosis:    ICD-10-CM   1. GAD (generalized anxiety disorder)  F41.1 hydrOXYzine (ATARAX/VISTARIL) 10 MG tablet    sertraline (ZOLOFT) 100 MG tablet    2. PTSD (post-traumatic stress disorder)  F43.10 sertraline (ZOLOFT) 100 MG tablet    3. Attention deficit hyperactivity disorder (ADHD), combined type  F90.2 sertraline (ZOLOFT) 100 MG tablet    atomoxetine (STRATTERA) 40 MG capsule  Past Psychiatric History:  PTSD Attention deficit hyperactivity disorder, combined type Generalized anxiety disorder  Past Medical History:  Past Medical History:  Diagnosis Date   Allergy    Anxiety    Depression    Hypertension     Past  Surgical History:  Procedure Laterality Date   CHOLECYSTECTOMY      Family Psychiatric History:  Unknown  Family History:  Family History  Problem Relation Age of Onset   Diabetes Mother    Hyperlipidemia Mother    Hypertension Mother    Intellectual disability Mother    Diabetes Father    Hyperlipidemia Father    Hypertension Father    Intellectual disability Father    Diabetes Sister    Intellectual disability Maternal Grandmother    Cancer Maternal Grandfather    Stroke Maternal Grandfather    Diabetes Maternal Grandfather    Intellectual disability Paternal Grandmother    Heart disease Paternal Grandmother    Cancer Paternal Grandmother    Heart disease Paternal Grandfather    Hyperlipidemia Sister     Social History:  Social History   Socioeconomic History   Marital status: Single    Spouse name: Not on file   Number of children: Not on file   Years of education: Not on file   Highest education level: Not on file  Occupational History   Not on file  Tobacco Use   Smoking status: Never   Smokeless tobacco: Never  Vaping Use   Vaping Use: Never used  Substance and Sexual Activity   Alcohol use: Not Currently   Drug use: Never   Sexual activity: Not Currently  Other Topics Concern   Not on file  Social History Narrative   Not on file   Social Determinants of Health   Financial Resource Strain: Low Risk    Difficulty of Paying Living Expenses: Not very hard  Food Insecurity: No Food Insecurity   Worried About Programme researcher, broadcasting/film/video in the Last Year: Never true   Ran Out of Food in the Last Year: Never true  Transportation Needs: Unmet Transportation Needs   Lack of Transportation (Medical): Yes   Lack of Transportation (Non-Medical): Yes  Physical Activity: Inactive   Days of Exercise per Week: 0 days   Minutes of Exercise per Session: 0 min  Stress: Stress Concern Present   Feeling of Stress : To some extent  Social Connections: Socially Isolated    Frequency of Communication with Friends and Family: More than three times a week   Frequency of Social Gatherings with Friends and Family: Twice a week   Attends Religious Services: Never   Database administrator or Organizations: No   Attends Engineer, structural: Never   Marital Status: Never married    Allergies: No Known Allergies  Metabolic Disorder Labs: Lab Results  Component Value Date   HGBA1C 6.1 02/27/2013   No results found for: PROLACTIN Lab Results  Component Value Date   CHOL 182 11/19/2016   TRIG 96 11/19/2016   HDL 48 11/19/2016   CHOLHDL 3.8 11/19/2016   VLDL 20 02/27/2013   LDLCALC 115 (H) 11/19/2016   LDLCALC 134 (H) 02/27/2013   Lab Results  Component Value Date   TSH 2.850 11/19/2016    Therapeutic Level Labs: No results found for: LITHIUM No results found for: VALPROATE No components found for:  CBMZ  Current Medications: Current Outpatient Medications  Medication Sig Dispense Refill   acetaminophen (TYLENOL) 325 MG tablet  Take 2 tablets (650 mg total) by mouth every 6 (six) hours as needed. 30 tablet 0   atomoxetine (STRATTERA) 40 MG capsule Take 1 capsule (40 mg total) by mouth daily. 30 capsule 1   clonazePAM (KLONOPIN) 0.5 MG tablet Take 0.5 tablets (0.25 mg total) by mouth daily. 2 tablet 0   dextromethorphan (COUGH DM) 30 MG/5ML liquid Take 60 mg by mouth every 12 (twelve) hours as needed for cough.     famotidine (PEPCID) 20 MG tablet Take 1 tablet (20 mg total) by mouth 2 (two) times daily. 30 tablet 0   fluticasone (FLONASE) 50 MCG/ACT nasal spray Place 1 spray into both nostrils daily as needed for allergies or rhinitis. 15.8 mL 0   hydrochlorothiazide (HYDRODIURIL) 25 MG tablet Take 25 mg by mouth 2 (two) times daily.     hydrOXYzine (ATARAX/VISTARIL) 10 MG tablet TAKE 1 TABLET(10 MG) BY MOUTH THREE TIMES DAILY AS NEEDED 75 tablet 0   ibuprofen (ADVIL) 600 MG tablet Take 1 tablet (600 mg total) by mouth every 6 (six) hours  as needed. 30 tablet 0   loratadine (CLARITIN) 10 MG tablet Take 10 mg by mouth daily.     ondansetron (ZOFRAN) 4 MG tablet Take 1 tablet (4 mg total) by mouth every 8 (eight) hours as needed for nausea or vomiting. 8 tablet 0   sertraline (ZOLOFT) 100 MG tablet Take 1 tablet (100 mg total) by mouth daily. 30 tablet 1   No current facility-administered medications for this visit.     Musculoskeletal: Strength & Muscle Tone: Unable to assess due to telemedicine visit Gait & Station: Unable to assess due to telemedicine visit Patient leans: Unable to assess due to telemedicine visit  Psychiatric Specialty Exam: Review of Systems  Psychiatric/Behavioral:  Positive for decreased concentration. Negative for dysphoric mood, hallucinations, self-injury, sleep disturbance and suicidal ideas. The patient is nervous/anxious. The patient is not hyperactive.    There were no vitals taken for this visit.There is no height or weight on file to calculate BMI.  General Appearance: Unable to assess due to telemedicine visit  Eye Contact:  Unable to assess due to telemedicine visit  Speech:  Clear and Coherent and Normal Rate  Volume:  Normal  Mood:  Anxious and Euthymic  Affect:  Appropriate and Congruent  Thought Process:  Coherent, Goal Directed, and Descriptions of Associations: Intact  Orientation:  Full (Time, Place, and Person)  Thought Content: WDL and Obsessions   Suicidal Thoughts:  No  Homicidal Thoughts:  No  Memory:  Immediate;   Good Recent;   Fair Remote;   Fair  Judgement:  Good  Insight:  Good  Psychomotor Activity:  Normal  Concentration:  Concentration: Good and Attention Span: Good  Recall:  Good  Fund of Knowledge: Good  Language: Good  Akathisia:  No  Handed:  Right  AIMS (if indicated): not done  Assets:  Communication Skills Desire for Improvement Resilience Social Support  ADL's:  Intact  Cognition: WNL  Sleep:  Good   Screenings: GAD-7    Flowsheet Row  Video Visit from 03/05/2021 in [facility] Video Visit from 01/17/2021 in [facility]  Total GAD-7 Score 9 12      PHQ2-9    Flowsheet Row Video Visit from 03/05/2021 in [facility] Video Visit from 01/17/2021 in [facility] Counselor from 11/05/2020 in [facility] Office Visit from 11/02/2017 in Primary Care  at [facility] Total Score 1 1 3  0  PHQ-9 Total Score -- -- 13 --      Flowsheet Row Video Visit from 03/05/2021 in [facility] Video Visit from 01/17/2021 in [facility] ED from 12/14/2020 in [facility]  C-SSRS RISK CATEGORY No Risk No Risk No Risk        Assessment and Plan:   Angie Roberts is a 40 year old female with a past psychiatric history significant for generalized anxiety disorder, PTSD, and attention deficit hyperactivity disorder who presents to [facility] via virtual telephone visit.  Patient reports no issues or concerns regarding her current medication regimen.  Patient denies experiencing any depressive episodes and states that her anxiety has been manageable at this time.  The only issue that the patient has concerns over is her compulsion to look up information about her obsessions.  Patient states that she would like to set up an appointment with neurology at some point.  Patient was recommended decreasing her dosage of Strattera from 25 mg to 40 mg daily. Patient is requesting refills on all her medications following the conclusion of the encounter.  Patient's medications to be e-prescribed to pharmacy of choice.  1. GAD (generalized anxiety disorder)  - hydrOXYzine (ATARAX/VISTARIL) 10 MG tablet; TAKE 1 TABLET(10 MG) BY MOUTH THREE TIMES DAILY AS NEEDED  Dispense: 75 tablet; Refill: 0 -  sertraline (ZOLOFT) 100 MG tablet; Take 1 tablet (100 mg total) by mouth daily.  Dispense: 30 tablet; Refill: 1  2. PTSD (post-traumatic stress disorder)  - sertraline (ZOLOFT) 100 MG tablet; Take 1 tablet (100 mg total) by mouth daily.  Dispense: 30 tablet; Refill: 1  3. Attention deficit hyperactivity disorder (ADHD), combined type  - sertraline (ZOLOFT) 100 MG tablet; Take 1 tablet (100 mg total) by mouth daily.  Dispense: 30 tablet; Refill: 1 - atomoxetine (STRATTERA) 40 MG capsule; Take 1 capsule (40 mg total) by mouth daily.  Dispense: 30 capsule; Refill: 1  Patient to follow up in 2 months Provider spent a total of 21 minutes with the patient/reviewing patient's chart  [facility], PA 03/05/2021, 8:00 PM

## 2021-03-17 ENCOUNTER — Encounter (HOSPITAL_COMMUNITY): Payer: Self-pay

## 2021-03-17 ENCOUNTER — Other Ambulatory Visit: Payer: Self-pay

## 2021-03-17 ENCOUNTER — Emergency Department (HOSPITAL_COMMUNITY): Payer: 59

## 2021-03-17 ENCOUNTER — Emergency Department (HOSPITAL_COMMUNITY)
Admission: EM | Admit: 2021-03-17 | Discharge: 2021-03-17 | Disposition: A | Discharge status: Home / Self Care | Payer: 59 | Attending: Emergency Medicine | Admitting: Emergency Medicine

## 2021-03-17 DIAGNOSIS — Z20822 Contact with and (suspected) exposure to covid-19: Secondary | ICD-10-CM | POA: Insufficient documentation

## 2021-03-17 DIAGNOSIS — I10 Essential (primary) hypertension: Secondary | ICD-10-CM | POA: Diagnosis not present

## 2021-03-17 DIAGNOSIS — B349 Viral infection, unspecified: Secondary | ICD-10-CM

## 2021-03-17 DIAGNOSIS — J029 Acute pharyngitis, unspecified: Secondary | ICD-10-CM | POA: Diagnosis present

## 2021-03-17 DIAGNOSIS — Z79899 Other long term (current) drug therapy: Secondary | ICD-10-CM | POA: Diagnosis not present

## 2021-03-17 LAB — RESP PANEL BY RT-PCR (FLU A&B, COVID) ARPGX2
Influenza A by PCR: NEGATIVE
Influenza B by PCR: NEGATIVE
SARS Coronavirus 2 by RT PCR: NEGATIVE

## 2021-03-17 IMAGING — CR DG CHEST 2V
2 series · 2 of 2 positions shown · non-contrast
Comparison: Chest x-ray [DATE]

CLINICAL DATA: Chest pain.  Nausea.

EXAM:
CHEST - 2 VIEW

[w chest pa]
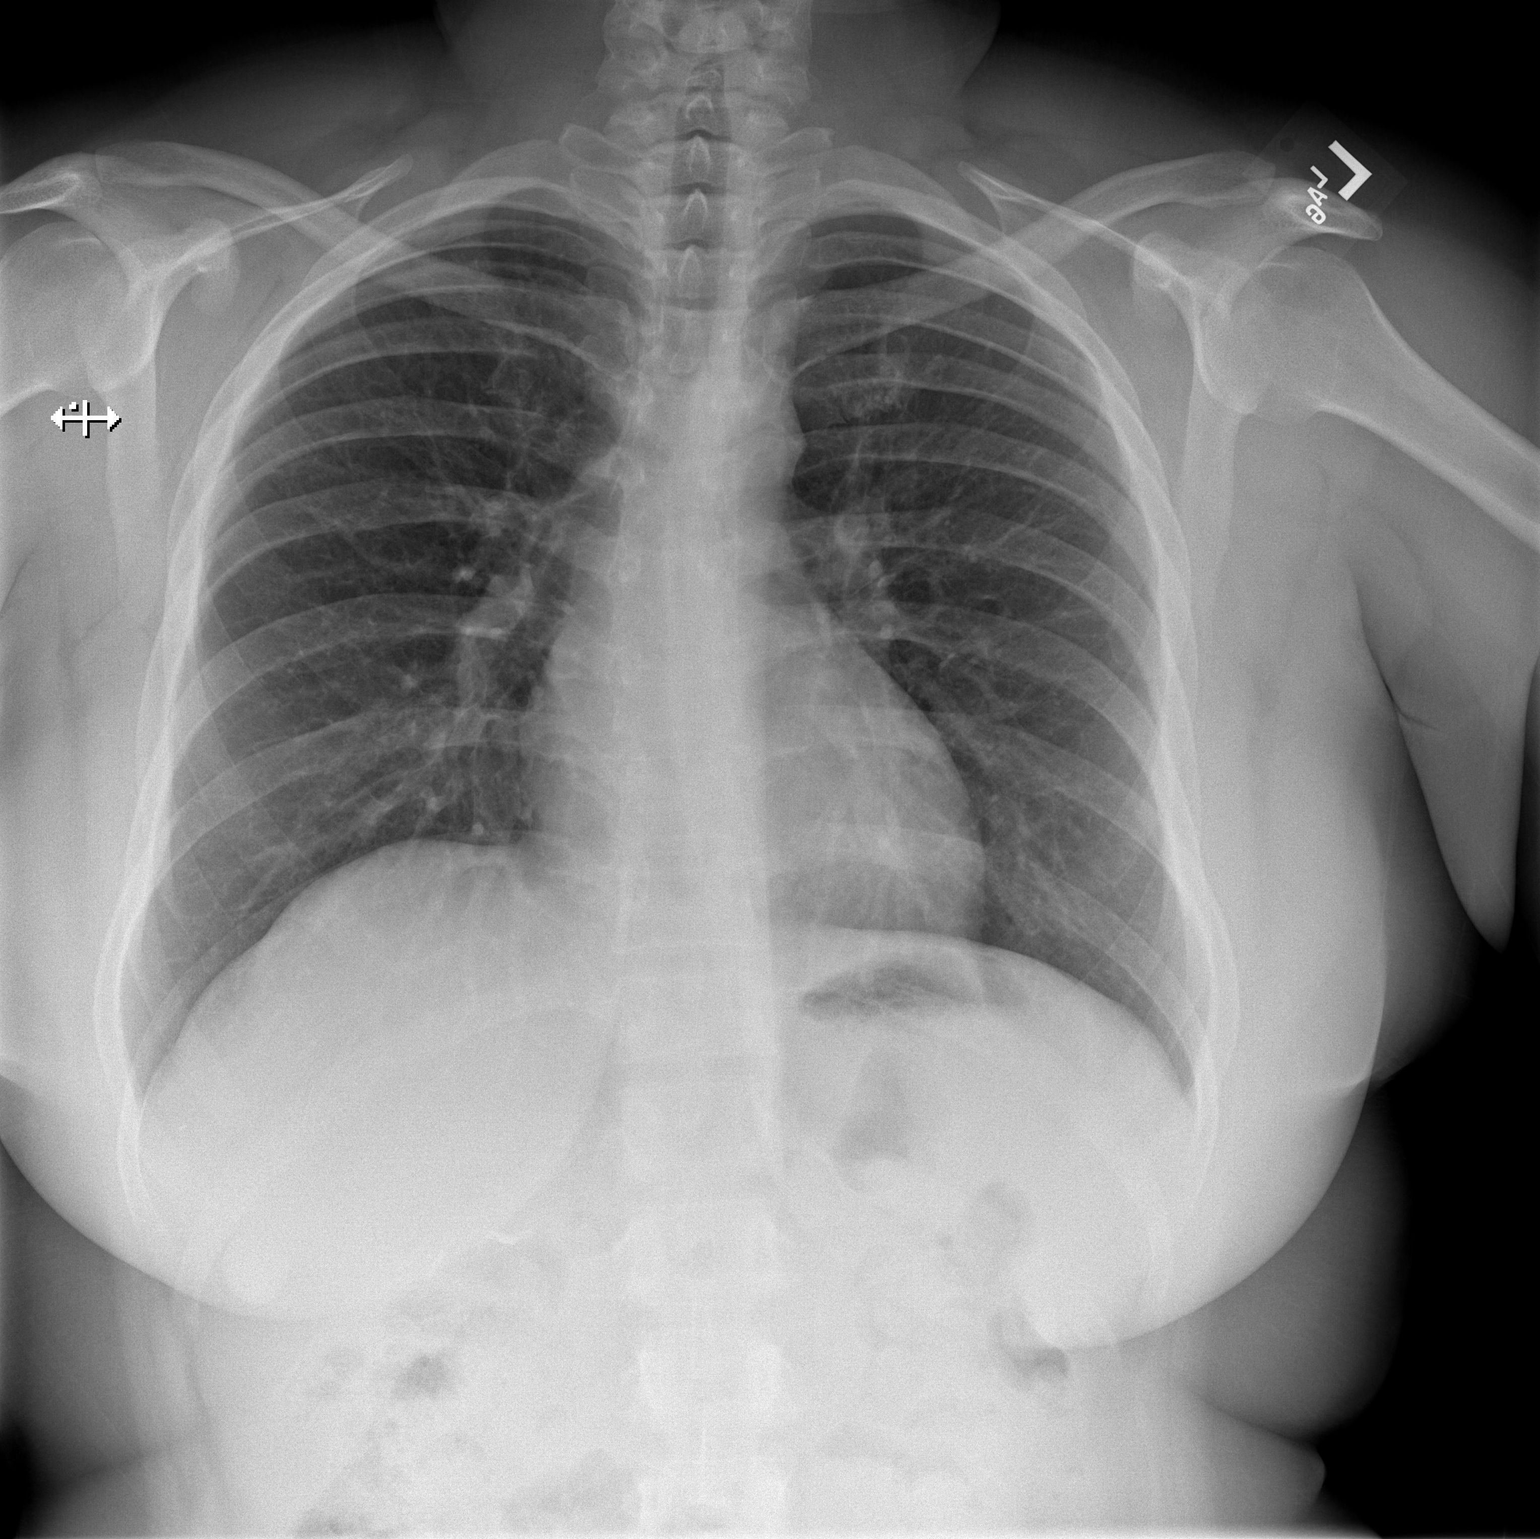

[w chest lat]
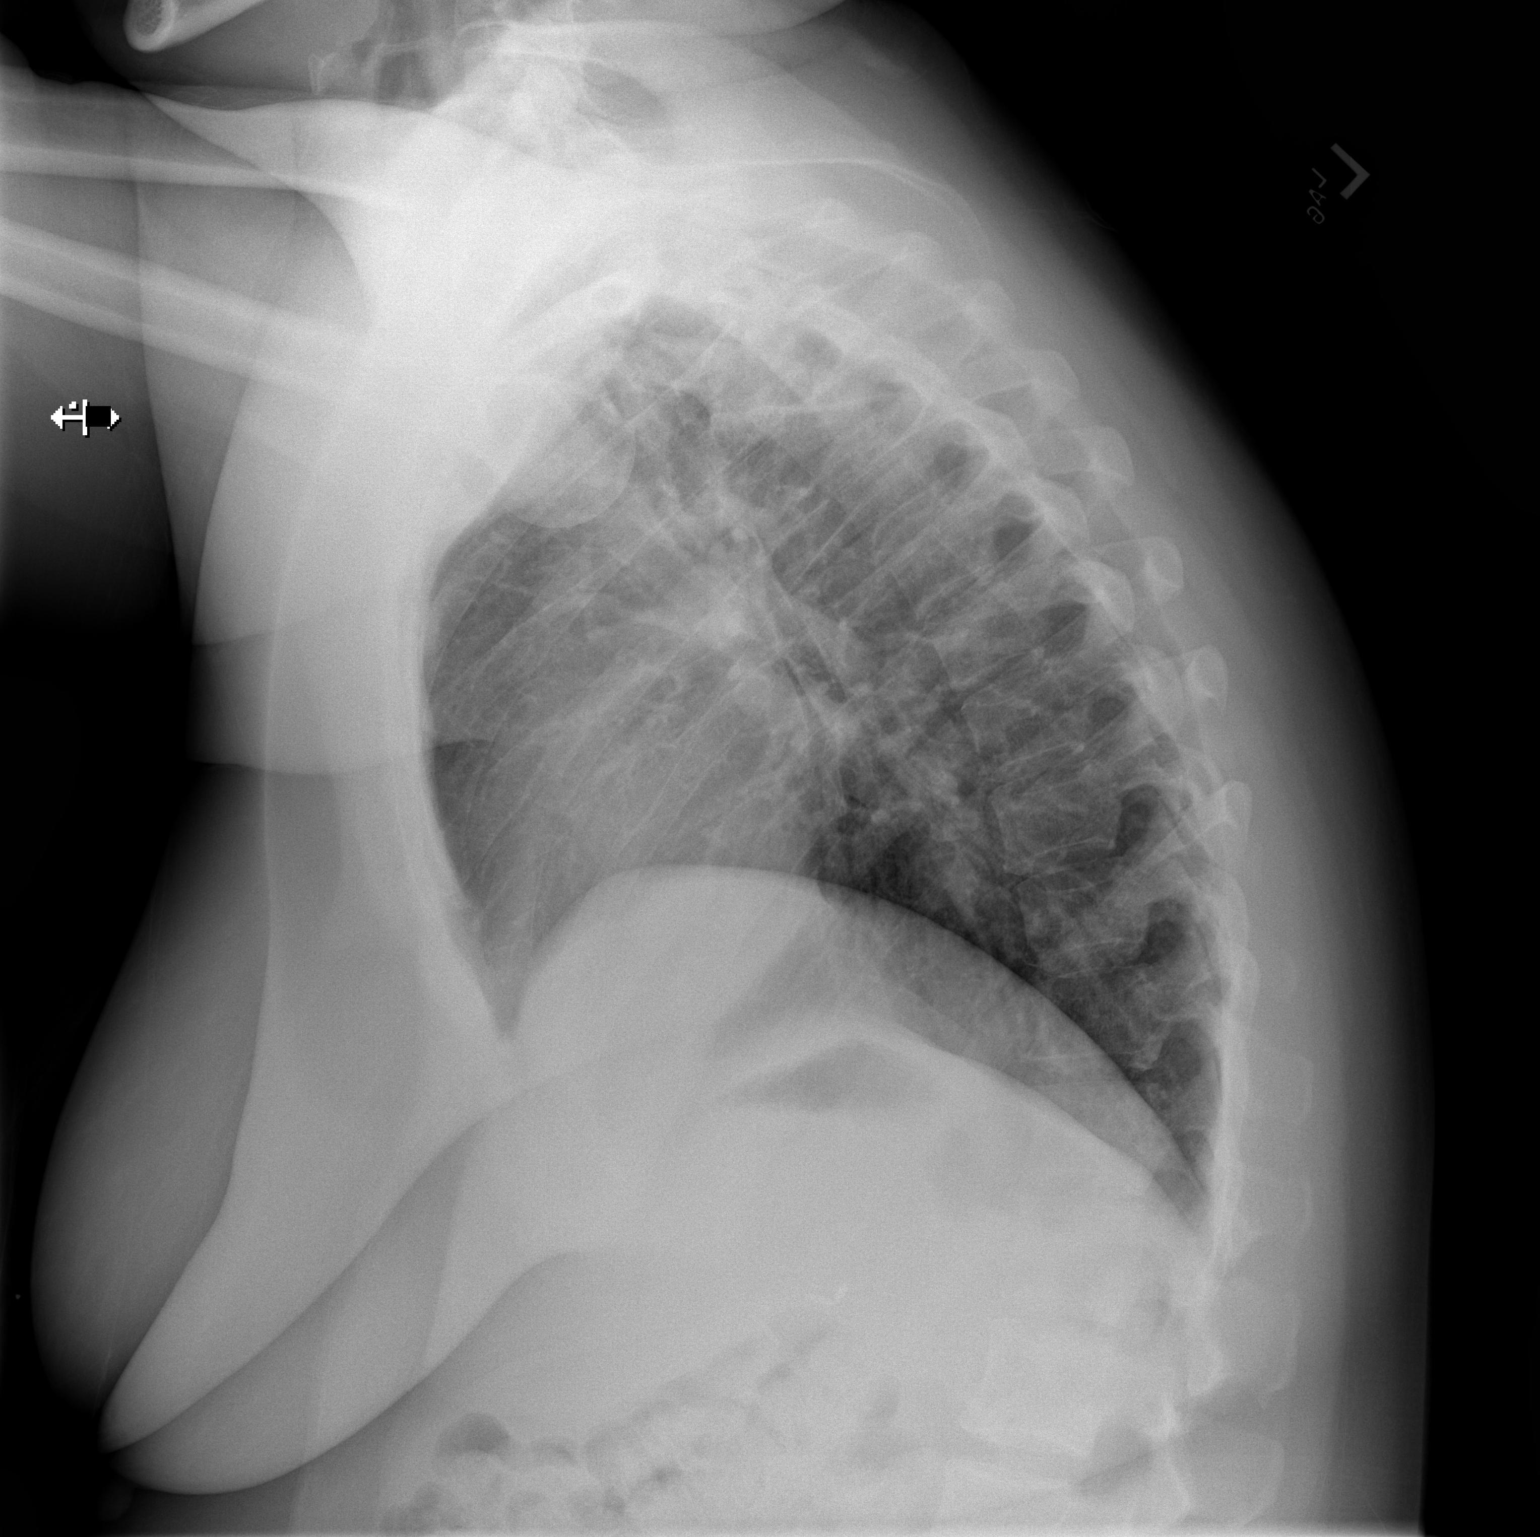

[2 of 2 positions shown; findings below may reference images not displayed]

FINDINGS: The heart and mediastinal contours are within normal limits.

No focal consolidation. No pulmonary edema. No pleural effusion. No
pneumothorax.

No acute osseous abnormality.
IMPRESSION: No active cardiopulmonary disease.

## 2021-03-17 MED ORDER — ACETAMINOPHEN 325 MG PO TABS
650.0000 mg | ORAL_TABLET | Freq: Once | ORAL | Status: AC
  Administered 2021-03-17: 650 mg via ORAL
  Filled 2021-03-17: qty 2

## 2021-03-17 MED ORDER — IBUPROFEN 800 MG PO TABS
800.0000 mg | ORAL_TABLET | Freq: Once | ORAL | Status: AC
  Administered 2021-03-17: 800 mg via ORAL
  Filled 2021-03-17: qty 1

## 2021-03-17 MED ORDER — FLUTICASONE PROPIONATE 50 MCG/ACT NA SUSP: 1.0000 | Freq: Every day | NASAL | 2 refills | Status: DC

## 2021-03-17 MED ORDER — CEPACOL REGULAR STRENGTH 3 MG MT LOZG: 1.0000 | LOZENGE | OROMUCOSAL | 12 refills | Status: DC | PRN

## 2021-03-17 NOTE — ED Triage Notes (Addendum)
Pt complains of pressure in her chest and throat x 2 days and states that she feels that she is coming down with the flu.

## 2021-03-17 NOTE — ED Provider Notes (Signed)
[facility] DEPT Provider Note   CSN: [id number] Arrival date & time: 03/17/21  2010     History Chief Complaint  Patient presents with   Chest Pain   Sore Throat    Angie Roberts is a 40 y.o. female.  This is a 40 y.o. female with significant medical history as below, including anxiety, depression, htn who presents to the ED with complaint of cough, rhinorrhea, malaise, subjective fevers/chills, chest discomfort, sore throat. Onset of symptoms 3-4 days ago. Unsure sick contacts. She has not been taking medications at home for her symptoms. Feels as though she is "coming down with the flu." She has no dyspnea. Non productive cough. No change to bowel/bladder fxn, no abnormal vaginal bleeding or discharge. No concern for STI or UTI. Rhinorrhea is clear. No dysphagia or dysphona. Severity of symptoms is mild     The history is provided by the patient. No language interpreter was used.  Chest Pain Associated symptoms: cough and fever   Associated symptoms: no abdominal pain, no dysphagia, no headache, no nausea, no palpitations, no shortness of breath and no vomiting   Sore Throat Associated symptoms include chest pain. Pertinent negatives include no abdominal pain, no headaches and no shortness of breath.      Past Medical History:  Diagnosis Date   Allergy    Anxiety    Depression    Hypertension     Patient Active Problem List   Diagnosis Date Noted   PTSD (post-traumatic stress disorder) 11/05/2020   GAD (generalized anxiety disorder) 11/05/2020   Attention deficit hyperactivity disorder (ADHD), combined type 11/05/2020   History of iron deficiency anemia 11/02/2017   Essential hypertension 12/03/2016   Healthcare maintenance 11/17/2016   Dysuria 11/17/2016    Past Surgical History:  Procedure Laterality Date   CHOLECYSTECTOMY       OB History   No obstetric history on file.     Family History  Problem Relation Age  of Onset   Diabetes Mother    Hyperlipidemia Mother    Hypertension Mother    Intellectual disability Mother    Diabetes Father    Hyperlipidemia Father    Hypertension Father    Intellectual disability Father    Diabetes Sister    Intellectual disability Maternal Grandmother    Cancer Maternal Grandfather    Stroke Maternal Grandfather    Diabetes Maternal Grandfather    Intellectual disability Paternal Grandmother    Heart disease Paternal Grandmother    Cancer Paternal Grandmother    Heart disease Paternal Grandfather    Hyperlipidemia Sister     Social History   Tobacco Use   Smoking status: Never   Smokeless tobacco: Never  Vaping Use   Vaping Use: Never used  Substance Use Topics   Alcohol use: Not Currently   Drug use: Never    Home Medications Prior to Admission medications   Medication Sig Start Date End Date Taking? Authorizing Provider  fluticasone (FLONASE) 50 MCG/ACT nasal spray Place 1 spray into both nostrils daily for 7 days. 03/17/21 03/24/21 Yes Jeanell Sparrow, DO  menthol-cetylpyridinium (CEPACOL REGULAR STRENGTH) 3 MG lozenge Take 1 lozenge (3 mg total) by mouth as needed for sore throat. 03/17/21  Yes Jeanell Sparrow, DO  acetaminophen (TYLENOL) 325 MG tablet Take 2 tablets (650 mg total) by mouth every 6 (six) hours as needed. 12/16/19   Darr, Edison Nasuti, PA-C  atomoxetine (STRATTERA) 40 MG capsule Take 1 capsule (40 mg total) by  mouth daily. 03/05/21   Nwoko, Terese Door, PA  clonazePAM (KLONOPIN) 0.5 MG tablet Take 0.5 tablets (0.25 mg total) by mouth daily. 01/17/21 01/17/22  Nwoko, Terese Door, PA  dextromethorphan (COUGH DM) 30 MG/5ML liquid Take 60 mg by mouth every 12 (twelve) hours as needed for cough.    [provider]  famotidine (PEPCID) 20 MG tablet Take 1 tablet (20 mg total) by mouth 2 (two) times daily. 12/16/19   Darr, Edison Nasuti, PA-C  fluticasone (FLONASE) 50 MCG/ACT nasal spray Place 1 spray into both nostrils daily as needed for allergies or  rhinitis. 12/16/19   Darr, Edison Nasuti, PA-C  hydrochlorothiazide (HYDRODIURIL) 25 MG tablet Take 25 mg by mouth 2 (two) times daily.    [provider]  hydrOXYzine (ATARAX/VISTARIL) 10 MG tablet TAKE 1 TABLET(10 MG) BY MOUTH THREE TIMES DAILY AS NEEDED 03/05/21   Nwoko, Isidoro Donning E, PA  ibuprofen (ADVIL) 600 MG tablet Take 1 tablet (600 mg total) by mouth every 6 (six) hours as needed. 12/16/19   Darr, Edison Nasuti, PA-C  loratadine (CLARITIN) 10 MG tablet Take 10 mg by mouth daily.    [provider]  ondansetron (ZOFRAN) 4 MG tablet Take 1 tablet (4 mg total) by mouth every 8 (eight) hours as needed for nausea or vomiting. 12/16/19   Darr, Edison Nasuti, PA-C  sertraline (ZOLOFT) 100 MG tablet Take 1 tablet (100 mg total) by mouth daily. 03/05/21 03/05/22  Malachy Mood, PA    Allergies    Patient has no known allergies.  Review of Systems   Review of Systems  Constitutional:  Positive for fever. Negative for chills.  HENT:  Positive for postnasal drip, rhinorrhea, sinus pressure and sore throat. Negative for facial swelling and trouble swallowing.   Eyes:  Negative for photophobia and visual disturbance.  Respiratory:  Positive for cough. Negative for shortness of breath.   Cardiovascular:  Positive for chest pain. Negative for palpitations.  Gastrointestinal:  Negative for abdominal pain, nausea and vomiting.  Endocrine: Negative for polydipsia and polyuria.  Genitourinary:  Negative for difficulty urinating and hematuria.  Musculoskeletal:  Positive for arthralgias. Negative for gait problem and joint swelling.  Skin:  Negative for pallor and rash.  Neurological:  Negative for syncope and headaches.  Psychiatric/Behavioral:  Negative for agitation and confusion.    Physical Exam Updated Vital Signs BP (!) 165/95 (BP Location: Right Arm)   Pulse 75   Temp 98.6 F (37 C) (Oral)   Resp 16   Ht 5\' 4"  (1.626 m)   Wt 102.1 kg   LMP 03/04/2021   SpO2 100%   BMI 38.62 kg/m    Physical Exam Vitals and nursing note reviewed.  Constitutional:      General: She is not in acute distress.    Appearance: Normal appearance. She is well-developed. She is not ill-appearing, toxic-appearing or diaphoretic.  HENT:     Head: Normocephalic and atraumatic.     Jaw: There is normal jaw occlusion. No trismus.     Comments: Low suspicion for deep space infection of posterior oropharynx    Right Ear: External ear normal.     Left Ear: External ear normal.     Nose: Nose normal.     Mouth/Throat:     Mouth: Mucous membranes are moist.     Pharynx: Oropharynx is clear. Uvula midline. No pharyngeal swelling, oropharyngeal exudate, posterior oropharyngeal erythema or uvula swelling.  Eyes:     General: No scleral icterus.  Right eye: No discharge.        Left eye: No discharge.  Cardiovascular:     Rate and Rhythm: Normal rate and regular rhythm.     Pulses: Normal pulses.     Heart sounds: Normal heart sounds.  Pulmonary:     Effort: Pulmonary effort is normal. No tachypnea or respiratory distress.     Breath sounds: Normal breath sounds. No stridor. No decreased breath sounds or wheezing.  Abdominal:     General: Abdomen is flat.     Palpations: Abdomen is soft.     Tenderness: There is no abdominal tenderness.  Musculoskeletal:        General: Normal range of motion.     Cervical back: Normal range of motion.     Right lower leg: No edema.     Left lower leg: No edema.  Skin:    General: Skin is warm and dry.     Capillary Refill: Capillary refill takes less than 2 seconds.  Neurological:     Mental Status: She is alert.  Psychiatric:        Mood and Affect: Mood normal.        Behavior: Behavior normal.    ED Results / Procedures / Treatments   Labs (all labs ordered are listed, but only abnormal results are displayed) Labs Reviewed  RESP PANEL BY RT-PCR (FLU A&B, COVID) [id number]    EKG EKG Interpretation  Date/Time:  Monday March 17 2021  21:09:04 EDT Ventricular Rate:  68 PR Interval:  157 QRS Duration: 93 QT Interval:  403 QTC Calculation: 429 R Axis:   40 Text Interpretation: Sinus rhythm Similar to prior tracing No STEMI Confirmed by Tanda Rockers ([id number]) on 03/17/2021 9:59:15 PM  Radiology DG Chest 2 View  Result Date: 03/17/2021 CLINICAL DATA:  Chest pain.  Nausea. EXAM: CHEST - 2 VIEW COMPARISON:  Chest x-ray 12/19/2020 FINDINGS: The heart and mediastinal contours are within normal limits. No focal consolidation. No pulmonary edema. No pleural effusion. No pneumothorax. No acute osseous abnormality. IMPRESSION: No active cardiopulmonary disease. Electronically Signed   By: Tish Frederickson M.D.   On: 03/17/2021 20:52    Procedures Procedures   Medications Ordered in ED Medications  ibuprofen (ADVIL) tablet 800 mg (800 mg Oral Given 03/17/21 2208)  acetaminophen (TYLENOL) tablet 650 mg (650 mg Oral Given 03/17/21 2208)    ED Course  I have reviewed the triage vital signs and the nursing notes.  Pertinent labs & imaging results that were available during my care of the patient were reviewed by me and considered in my medical decision making (see chart for details).    MDM Rules/Calculators/A&P                            CC: uri symptoms   This patient complains of above; this involves an extensive number of treatment options and is a complaint that carries with it a high risk of complications and morbidity. Vital signs were reviewed. Serious etiologies considered.  Record review:  Previous records obtained and reviewed   Work up as above, notable for:  Imaging results that were available during my care of the patient were reviewed by me and considered in my medical decision making.   I ordered imaging studies which included cXR and I independently visualized and interpreted imaging which showed no acute process  EKG reviewed, no evidence of acute ischemia.   Low risk HEAR score,  Suspicion for ACS is  very low  Well's score is low, PERC rule applies, PE is unlikely.  Management: Given motrin/tylenol    Patient presented to the ER today for fever symptoms. On exam was well appearing and non toxic. Vitals were reviewed. Patient has no symptoms of otitis media, pneumonia,  bacterial pharyngitis or other serious bacterial illness. Respiratory status is unremarkable without wheezing distress. At this point in time, patient likely has viral syndrome with no indications for antibiotics. I do not suspect UTI at this time.I have recommended fluids and anti-pyretics for symptomatic control.  The patient improved significantly and was discharged in stable condition. Detailed discussions were had with the patient regarding current findings, and need for close f/u with PCP or on call doctor. The patient has been instructed to return immediately if the symptoms worsen in any way for re-evaluation. Patient verbalized understanding and is in agreement with current care plan. All questions answered prior to discharge.         This chart was dictated using voice recognition software.  Despite best efforts to proofread,  errors can occur which can change the documentation meaning.  Final Clinical Impression(s) / ED Diagnoses Final diagnoses:  Pharyngitis, unspecified etiology  Viral syndrome    Rx / DC Orders ED Discharge Orders          Ordered    fluticasone (FLONASE) 50 MCG/ACT nasal spray  Daily        03/17/21 2202    menthol-cetylpyridinium (CEPACOL REGULAR STRENGTH) 3 MG lozenge  As needed        03/17/21 2202             Jeanell Sparrow, DO 03/17/21 2348

## 2021-03-17 NOTE — ED Provider Notes (Signed)
Emergency Medicine Provider Triage Evaluation Note  Angie Roberts , a 40 y.o. female  was evaluated in triage.  Pt complains of chest pain.  Been intermittent for last 2 weeks, worsened today.  Associated with shortness of breath and nausea, no vomiting.  Patient also complaining of viral urinary symptoms.  Review of Systems  Positive: Chest pain, nausea, shortness of breath Negative: Vomiting  Physical Exam  There were no vitals taken for this visit. Gen:   Awake, no distress   Resp:  Normal effort  MSK:   Moves extremities without difficulty  Other:    Medical Decision Making  Medically screening exam initiated at 8:40 PM.  Appropriate orders placed.  Angie Roberts was informed that the remainder of the evaluation will be completed by another provider, this initial triage assessment does not replace that evaluation, and the importance of remaining in the ED until their evaluation is complete.  Chest pain work-up   Theron Arista, Cordelia Poche 03/17/21 2041    Jacalyn Lefevre, MD 03/17/21 2045

## 2021-03-17 NOTE — Discharge Instructions (Addendum)
Please take tylenol or motrin as needed for body aches, ongoing symptoms   Please check your mychart regarding flu and  covid test results

## 2021-03-25 ENCOUNTER — Encounter: Payer: Self-pay | Admitting: Internal Medicine

## 2021-03-25 ENCOUNTER — Other Ambulatory Visit: Payer: Self-pay

## 2021-03-25 ENCOUNTER — Ambulatory Visit (INDEPENDENT_AMBULATORY_CARE_PROVIDER_SITE_OTHER): Payer: 59 | Admitting: Internal Medicine

## 2021-03-25 VITALS — BP 130/88 | HR 76 | Ht 64.0 in | Wt 222.8 lb

## 2021-03-25 DIAGNOSIS — R079 Chest pain, unspecified: Secondary | ICD-10-CM

## 2021-03-25 NOTE — Patient Instructions (Signed)
Medication Instructions:  No change   Lab Work: None ordered   Testing/Procedures: None ordered   Follow-Up: At [facility], you and your health needs are our priority.  As part of our continuing mission to provide you with exceptional heart care, we have created designated Provider Care Teams.  These Care Teams include your primary Cardiologist (physician) and Advanced Practice Providers (APPs -  Physician Assistants and Nurse Practitioners) who all work together to provide you with the care you need, when you need it.  We recommend signing up for the patient portal called "MyChart".  Sign up information is provided on this After Visit Summary.  MyChart is used to connect with patients for Virtual Visits (Telemedicine).  Patients are able to view lab/test results, encounter notes, upcoming appointments, etc.  Non-urgent messages can be sent to your provider as well.   To learn more about what you can do with MyChart, go to [email or web address].      Your next appointment:  As Needed    The format for your next appointment: Office   Provider:  Dr.Branch

## 2021-03-25 NOTE — Progress Notes (Signed)
Cardiology Office Note:    Date:  03/25/2021   ID:  Angie Roberts, DOB 1981-02-04, MRN [id number]  PCP:  Deatra James, MD   [facility] Providers Cardiologist:  None     Referring MD: Tally Joe, MD   No chief complaint on file. Chest pain  History of Present Illness:    Angie Roberts is a 40 y.o. female with a hx of hypertension, anxiety referral for atypical chest pain  Patient went to the ED diagnosed with a viral URI. Her EKG was normal 03/18/2021, 03/17/2021.  She had a sharp right sided pain. She felt hot. She went to sit down. She felt warm. She called her primary care. She did a phone visit. She felt worse that evening and went to the ED. She had sharp pain on the right and the middle and all over. IN the ED, she was HDS. She had an xray. She received tylenol and ibuprofen. She said it felt like bronchitis, and sore throat. She went to work, and her pain is improving. She noted this occurred earlier this year and got an ekg. She works as a Licensed conveyancer. She is on her feet a lot. She carries items under 30 pounds, part time. No chest pain with her job. No shortness of breath. Sometime when she bends down, and she stands up fast she feels dizzy. She feels off balance. She tries to hydrate. She denies orthopnea, PND, or LE edema. Maternal grandmother had heart failure. She was a smoker. Father smoker. She does snore, no sleep study.She wears a mouth piece.    10/01/2020 LDL 121 HDL 43 TG 209 Crt 0.9 TSH 4.6 (01/14/2021)  CVD Risk/Equivalent: HLD- yes HTN- no PAD- No DMII- No Smoker-No Stroke-No Premature Family History- Sister CABG3v, non smoker. 44 Pregnancies - none  No family SCD   Past Medical History:  Diagnosis Date   Allergy    Anxiety    Depression    Hypertension     Past Surgical History:  Procedure Laterality Date   CHOLECYSTECTOMY      Current Medications: No outpatient medications have been marked as taking for the 03/25/21  encounter (Appointment) with Maisie Fus, MD.     Allergies:   Patient has no known allergies.   Social History   Socioeconomic History   Marital status: Single    Spouse name: Not on file   Number of children: Not on file   Years of education: Not on file   Highest education level: Not on file  Occupational History   Not on file  Tobacco Use   Smoking status: Never   Smokeless tobacco: Never  Vaping Use   Vaping Use: Never used  Substance and Sexual Activity   Alcohol use: Not Currently   Drug use: Never   Sexual activity: Not Currently  Other Topics Concern   Not on file  Social History Narrative   Not on file   Social Determinants of Health   Financial Resource Strain: Low Risk    Difficulty of Paying Living Expenses: Not very hard  Food Insecurity: No Food Insecurity   Worried About Running Out of Food in the Last Year: Never true   Ran Out of Food in the Last Year: Never true  Transportation Needs: Unmet Transportation Needs   Lack of Transportation (Medical): Yes   Lack of Transportation (Non-Medical): Yes  Physical Activity: Inactive   Days of Exercise per Week: 0 days   Minutes of Exercise per  Session: 0 min  Stress: Stress Concern Present   Feeling of Stress : To some extent  Social Connections: Socially Isolated   Frequency of Communication with Friends and Family: More than three times a week   Frequency of Social Gatherings with Friends and Family: Twice a week   Attends Religious Services: Never   Database administrator or Organizations: No   Attends Engineer, structural: Never   Marital Status: Never married     Family History: The patient's family history includes Cancer in her maternal grandfather and paternal grandmother; Diabetes in her father, maternal grandfather, mother, and sister; Heart disease in her paternal grandfather and paternal grandmother; Hyperlipidemia in her father, mother, and sister; Hypertension in her father and  mother; Intellectual disability in her father, maternal grandmother, mother, and paternal grandmother; Stroke in her maternal grandfather.  ROS:   Please see the history of present illness.     All other systems reviewed and are negative.  EKGs/Labs/Other Studies Reviewed:    The following studies were reviewed today:   EKG:  EKG is  ordered today.  The ekg ordered today demonstrates   NSR, no ischemic changes   Recent Labs: No results found for requested labs within last 8760 hours.  Recent Lipid Panel    Component Value Date/Time   CHOL 182 11/19/2016 1918   CHOL 208 (H) 02/27/2013 1426   TRIG 96 11/19/2016 1918   TRIG 99 02/27/2013 1426   HDL 48 11/19/2016 1918   HDL 54 02/27/2013 1426   CHOLHDL 3.8 11/19/2016 1918   VLDL 20 02/27/2013 1426   LDLCALC 115 (H) 11/19/2016 1918   LDLCALC 134 (H) 02/27/2013 1426     Risk Assessment/Calculations:           Physical Exam:    VS:   Vitals:   03/25/21 0926  BP: 130/88  Pulse: 76  SpO2: 97%     LMP 03/04/2021     Wt Readings from Last 3 Encounters:  03/17/21 225 lb (102.1 kg)  12/14/20 230 lb (104.3 kg)  07/10/20 235 lb (106.6 kg)     GEN:  Well nourished, well developed in no acute distress HEENT: Normal NECK: No JVD; No carotid bruits LYMPHATICS: No lymphadenopathy CARDIAC: RRR, no murmurs, rubs, gallops RESPIRATORY:  Clear to auscultation without rales, wheezing or rhonchi  ABDOMEN: Soft, non-tender, non-distended MUSCULOSKELETAL:  No edema; No deformity  SKIN: Warm and dry NEUROLOGIC:  Alert and oriented x 3 PSYCHIATRIC:  Normal affect   ASSESSMENT:    #Atypical Chest pain: She has non anginal symptoms and noted similar to bronchitis. She has had normal EKGs. With her family hx of CAD we discussed the importance of diet. I recommended low in fats, higher in whole grains, vegetables and fruits. Material was given. We discussed signs and symptoms of CAD/CHF and noted that if she develops these  symptoms to let us know.  PLAN:    In order of problems listed above:  PRN Follow up      Medication Adjustments/Labs and Tests Ordered: Current medicines are reviewed at length with the patient today.  Concerns regarding medicines are outlined above.    Signed, Maisie Fus, MD  03/25/2021 9:10 AM     [facility]

## 2021-05-08 ENCOUNTER — Telehealth (INDEPENDENT_AMBULATORY_CARE_PROVIDER_SITE_OTHER): Payer: 59 | Admitting: Physician Assistant

## 2021-05-08 ENCOUNTER — Encounter (HOSPITAL_COMMUNITY): Payer: Self-pay | Admitting: Physician Assistant

## 2021-05-08 DIAGNOSIS — F411 Generalized anxiety disorder: Secondary | ICD-10-CM | POA: Diagnosis not present

## 2021-05-08 DIAGNOSIS — F4310 Post-traumatic stress disorder, unspecified: Secondary | ICD-10-CM | POA: Diagnosis not present

## 2021-05-08 DIAGNOSIS — F902 Attention-deficit hyperactivity disorder, combined type: Secondary | ICD-10-CM | POA: Diagnosis not present

## 2021-05-08 MED ORDER — SERTRALINE HCL 50 MG PO TABS: 150.0000 mg | ORAL_TABLET | Freq: Every day | ORAL | 1 refills | Status: DC

## 2021-05-08 MED ORDER — ATOMOXETINE HCL 40 MG PO CAPS: 40.0000 mg | ORAL_CAPSULE | Freq: Every day | ORAL | 1 refills | Status: DC

## 2021-05-08 MED ORDER — HYDROXYZINE HCL 10 MG PO TABS: ORAL_TABLET | ORAL | 0 refills | Status: DC

## 2021-05-08 NOTE — Progress Notes (Signed)
BH MD/PA/NP OP Progress Note  Virtual Visit via Telephone Note  I connected with Angie Roberts on 05/08/21 at 11:00 AM EST by telephone and verified that I am speaking with the correct person using two identifiers.  Location: Patient: Home Provider: Clinic   I discussed the limitations, risks, security and privacy concerns of performing an evaluation and management service by telephone and the availability of in person appointments. I also discussed with the patient that there may be a patient responsible charge related to this service. The patient expressed understanding and agreed to proceed.  Follow Up Instructions:  I discussed the assessment and treatment plan with the patient. The patient was provided an opportunity to ask questions and all were answered. The patient agreed with the plan and demonstrated an understanding of the instructions.   The patient was advised to call back or seek an in-person evaluation if the symptoms worsen or if the condition fails to improve as anticipated.  I provided 24 minutes of non-face-to-face time during this encounter.  Meta Hatchet, PA    05/08/2021 6:08 PM Angie Roberts  MRN:  [id number]  Chief Complaint: Follow up and medication management  HPI:   Angie Roberts is a 40 year old female with a past psychiatric history significant for generalized anxiety disorder, PTSD, and attention deficit hyperactivity disorder who presents to [facility] via virtual telephone visit for follow-up and medication management.  Patient is currently being managed on the following medications:  Hydroxyzine 10 mg 3 times daily as needed Sertraline 100 mg daily Strattera 40 mg daily  Patient reports no issues or concerns with her current medication regimen.  She reports that she has been extremely depressed and has been having memory issues.  She attributes her worsening depression to recently  being denied disability.  Patient states that she feels overwhelmed over the decision and feels like she does not have the help she needs.  Patient states that she has aspirations to go back to school but has been unable to due to financial instability.  Patient reports that her Wilhemena Durie has been helpful in the management of her focus and concentration and denies the need for dosage adjustments.  Patient notes that her anxiety has been manageable at this time.  She states that she is able to tell whenever her anxiety becomes unmanageable.  Whenever she does have elevated anxiety, she states that it is hard to go to sleep.  Patient rates her anxiety a 4 out of 10.  A PHQ-9 screen was performed with the patient scoring a 10.  A GAD-7 screen was also performed with the patient scoring a 5.  Patient is alert and oriented x4, calm, cooperative, and fully engaged in conversation during the encounter.  Patient endorses okay mood.  Patient denies suicidal or homicidal ideations.  She further denies auditory or visual hallucinations and does not appear to be responding to internal/external stimuli.  Patient endorses good sleep and receives on average 10 hours of sleep each night.  Patient endorses good appetite and eats on average 2 meals per day.  Patient denies alcohol consumption, tobacco use, and illicit drug use.  Visit Diagnosis:    ICD-10-CM   1. Attention deficit hyperactivity disorder (ADHD), combined type  F90.2 atomoxetine (STRATTERA) 40 MG capsule    sertraline (ZOLOFT) 50 MG tablet    2. GAD (generalized anxiety disorder)  F41.1 sertraline (ZOLOFT) 50 MG tablet    hydrOXYzine (ATARAX) 10 MG tablet  3. PTSD (post-traumatic stress disorder)  F43.10 sertraline (ZOLOFT) 50 MG tablet      Past Psychiatric History:  PTSD Attention deficit hyperactivity disorder, combined type Generalized anxiety disorder  Past Medical History:  Past Medical History:  Diagnosis Date   Allergy    Anxiety     Depression    Hypertension     Past Surgical History:  Procedure Laterality Date   CHOLECYSTECTOMY      Family Psychiatric History:  Unknown  Family History:  Family History  Problem Relation Age of Onset   Diabetes Mother    Hyperlipidemia Mother    Hypertension Mother    Intellectual disability Mother    Diabetes Father    Hyperlipidemia Father    Hypertension Father    Intellectual disability Father    Diabetes Sister    Intellectual disability Maternal Grandmother    Cancer Maternal Grandfather    Stroke Maternal Grandfather    Diabetes Maternal Grandfather    Intellectual disability Paternal Grandmother    Heart disease Paternal Grandmother    Cancer Paternal Grandmother    Heart disease Paternal Grandfather    Hyperlipidemia Sister     Social History:  Social History   Socioeconomic History   Marital status: Single    Spouse name: Not on file   Number of children: Not on file   Years of education: Not on file   Highest education level: Not on file  Occupational History   Not on file  Tobacco Use   Smoking status: Never   Smokeless tobacco: Never  Vaping Use   Vaping Use: Never used  Substance and Sexual Activity   Alcohol use: Not Currently   Drug use: Never   Sexual activity: Not Currently  Other Topics Concern   Not on file  Social History Narrative   Not on file   Social Determinants of Health   Financial Resource Strain: Low Risk    Difficulty of Paying Living Expenses: Not very hard  Food Insecurity: No Food Insecurity   Worried About Programme researcher, broadcasting/film/video in the Last Year: Never true   Ran Out of Food in the Last Year: Never true  Transportation Needs: Unmet Transportation Needs   Lack of Transportation (Medical): Yes   Lack of Transportation (Non-Medical): Yes  Physical Activity: Inactive   Days of Exercise per Week: 0 days   Minutes of Exercise per Session: 0 min  Stress: Stress Concern Present   Feeling of Stress : To some  extent  Social Connections: Socially Isolated   Frequency of Communication with Friends and Family: More than three times a week   Frequency of Social Gatherings with Friends and Family: Twice a week   Attends Religious Services: Never   Database administrator or Organizations: No   Attends Engineer, structural: Never   Marital Status: Never married    Allergies: No Known Allergies  Metabolic Disorder Labs: Lab Results  Component Value Date   HGBA1C 6.1 02/27/2013   No results found for: PROLACTIN Lab Results  Component Value Date   CHOL 182 11/19/2016   TRIG 96 11/19/2016   HDL 48 11/19/2016   CHOLHDL 3.8 11/19/2016   VLDL 20 02/27/2013   LDLCALC 115 (H) 11/19/2016   LDLCALC 134 (H) 02/27/2013   Lab Results  Component Value Date   TSH 2.850 11/19/2016    Therapeutic Level Labs: No results found for: LITHIUM No results found for: VALPROATE No components found for:  CBMZ  Current Medications: Current Outpatient Medications  Medication Sig Dispense Refill   acetaminophen (TYLENOL) 325 MG tablet Take 2 tablets (650 mg total) by mouth every 6 (six) hours as needed. 30 tablet 0   atomoxetine (STRATTERA) 40 MG capsule Take 1 capsule (40 mg total) by mouth daily. 30 capsule 1   clonazePAM (KLONOPIN) 0.5 MG tablet Take 0.5 tablets (0.25 mg total) by mouth daily. 2 tablet 0   dextromethorphan (DELSYM) 30 MG/5ML liquid Take 60 mg by mouth every 12 (twelve) hours as needed for cough.     hydrochlorothiazide (HYDRODIURIL) 25 MG tablet Take 25 mg by mouth 2 (two) times daily.     hydrOXYzine (ATARAX) 10 MG tablet TAKE 1 TABLET(10 MG) BY MOUTH THREE TIMES DAILY AS NEEDED 75 tablet 0   loratadine (CLARITIN) 10 MG tablet Take 10 mg by mouth daily.     sertraline (ZOLOFT) 50 MG tablet Take 3 tablets (150 mg total) by mouth daily. 90 tablet 1   No current facility-administered medications for this visit.     Musculoskeletal: Strength & Muscle Tone: Unable to assess due  to telemedicine visit Gait & Station: Unable to assess due to telemedicine visit Patient leans: Unable to assess due to telemedicine visit  Psychiatric Specialty Exam: Review of Systems  Psychiatric/Behavioral:  Negative for decreased concentration, dysphoric mood, hallucinations, self-injury, sleep disturbance and suicidal ideas. The patient is nervous/anxious. The patient is not hyperactive.    There were no vitals taken for this visit.There is no height or weight on file to calculate BMI.  General Appearance: Unable to assess due to telemedicine visit  Eye Contact:  Unable to assess due to telemedicine visit  Speech:  Clear and Coherent and Normal Rate  Volume:  Normal  Mood:  Anxious and Depressed  Affect:  Congruent  Thought Process:  Coherent, Goal Directed, and Descriptions of Associations: Intact  Orientation:  Full (Time, Place, and Person)  Thought Content: WDL   Suicidal Thoughts:  No  Homicidal Thoughts:  No  Memory:  Immediate;   Good Recent;   Fair Remote;   Fair  Judgement:  Good  Insight:  Good  Psychomotor Activity:  Normal  Concentration:  Concentration: Good and Attention Span: Good  Recall:  Good  Fund of Knowledge: Good  Language: Good  Akathisia:  No  Handed:  Right  AIMS (if indicated): not done  Assets:  Communication Skills Desire for Improvement Resilience Social Support  ADL's:  Intact  Cognition: WNL  Sleep:  Good   Screenings: GAD-7    Flowsheet Row Video Visit from 05/08/2021 in [facility] Video Visit from 03/05/2021 in [facility] Video Visit from 01/17/2021 in [facility]  Total GAD-7 Score 5 9 12       PHQ2-9    Flowsheet Row Video Visit from 05/08/2021 in [facility] Video Visit from 03/05/2021 in [facility] Video Visit from 01/17/2021 in [facility] Counselor from  11/05/2020 in [facility] Office Visit from 11/02/2017 in [facility] Care at [facility] Total Score 4 1 1 3  0  PHQ-9 Total Score 10 -- -- 13 --      Flowsheet Row Video Visit from 05/08/2021 in [facility] from 03/17/2021 in [facility] Video Visit from 03/05/2021 in [facility]  C-SSRS RISK CATEGORY No Risk No Risk No Risk  Assessment and Plan:   Angie Roberts is a 40 year old female with a past psychiatric history significant for generalized anxiety disorder, PTSD, and attention deficit hyperactivity disorder who presents to [facility] via virtual telephone visit for follow-up and medication management.  Patient endorses worsening depression due to stressors in her life.  She reports that her focus and concentration as well as her anxiety has been manageable.  Provider recommended adjusting patient's dosage of sertraline from 100 mg to 150 mg daily for the management of her worsening depressive symptoms.  Patient was agreeable to recommendation.  Patient's medication to be e-prescribed to pharmacy of choice.  1. Attention deficit hyperactivity disorder (ADHD), combined type  - atomoxetine (STRATTERA) 40 MG capsule; Take 1 capsule (40 mg total) by mouth daily.  Dispense: 30 capsule; Refill: 1 - sertraline (ZOLOFT) 50 MG tablet; Take 3 tablets (150 mg total) by mouth daily.  Dispense: 90 tablet; Refill: 1  2. GAD (generalized anxiety disorder)  - sertraline (ZOLOFT) 50 MG tablet; Take 3 tablets (150 mg total) by mouth daily.  Dispense: 90 tablet; Refill: 1 - hydrOXYzine (ATARAX) 10 MG tablet; TAKE 1 TABLET(10 MG) BY MOUTH THREE TIMES DAILY AS NEEDED  Dispense: 75 tablet; Refill: 0  3. PTSD (post-traumatic stress disorder)  - sertraline (ZOLOFT) 50 MG tablet; Take 3 tablets (150 mg total) by mouth daily.  Dispense:  90 tablet; Refill: 1  Patient to follow up in 6 weeks Provider spent a total of 24 minutes with the patient/reviewing patient's chart  Meta Hatchet, PA 05/08/2021, 6:08 PM

## 2021-06-16 ENCOUNTER — Telehealth: Payer: Self-pay | Admitting: Internal Medicine

## 2021-06-16 NOTE — Telephone Encounter (Signed)
°  Per MyChart scheduling request message:  I've been experiencing CP on and off this week. I went to [facility] earlier this month with CP concerns. At work I do get a little short of breath when rapidly doing task. Mouth dry and some time when I get up to fast I feel faint. I do not take nitroglycerin. Chest pain comes and goes

## 2021-06-16 NOTE — Telephone Encounter (Signed)
Spoke with patient of Dr. Wyline Mood - seen 03/2021 - was a PRN f/u   She reports chest pains off and on, some shortness of breath. She is not sure if the SOB is related to her working or not. She has been to [facility] walk-in a few times about this.   Scheduler made an appt for 2/6 -- moved to sooner date of 2/2 @ 3pm  Advised ED if chest pain lasts >30 mins, worsens in intensity/frequency

## 2021-06-19 ENCOUNTER — Ambulatory Visit (INDEPENDENT_AMBULATORY_CARE_PROVIDER_SITE_OTHER): Payer: Managed Care, Other (non HMO) | Admitting: Internal Medicine

## 2021-06-19 ENCOUNTER — Encounter: Payer: Self-pay | Admitting: Internal Medicine

## 2021-06-19 ENCOUNTER — Other Ambulatory Visit: Payer: Self-pay

## 2021-06-19 VITALS — BP 130/86 | HR 81 | Ht 64.0 in | Wt 223.0 lb

## 2021-06-19 DIAGNOSIS — Z01812 Encounter for preprocedural laboratory examination: Secondary | ICD-10-CM

## 2021-06-19 DIAGNOSIS — R079 Chest pain, unspecified: Secondary | ICD-10-CM | POA: Diagnosis not present

## 2021-06-19 DIAGNOSIS — R072 Precordial pain: Secondary | ICD-10-CM

## 2021-06-19 MED ORDER — METOPROLOL TARTRATE 100 MG PO TABS: ORAL_TABLET | ORAL | 0 refills | Status: DC

## 2021-06-19 NOTE — Progress Notes (Signed)
Cardiology Office Note:    Date:  06/19/2021   ID:  Haeven Nickle, DOB Aug 24, 1980, MRN [id number]  PCP:  Deatra James, MD   [facility] Providers Cardiologist:  None     Referring MD: Deatra James, MD   No chief complaint on file. Chest pain  History of Present Illness:    Aviance Talla Morency is a 41 y.o. female with a hx of hypertension, anxiety referral for atypical chest pain  Patient went to the ED diagnosed with a viral URI. Her EKG was normal 03/18/2021, 03/17/2021.  She had a sharp right sided pain. She felt hot. She went to sit down. She felt warm. She called her primary care. She did a phone visit. She felt worse that evening and went to the ED. She had sharp pain on the right and the middle and all over. IN the ED, she was HDS. She had an xray. She received tylenol and ibuprofen. She said it felt like bronchitis, and sore throat. She went to work, and her pain is improving. She noted this occurred earlier this year and got an ekg. She works as a Licensed conveyancer. She is on her feet a lot. She carries items under 30 pounds, part time. No chest pain with her job. No shortness of breath. Sometime when she bends down, and she stands up fast she feels dizzy. She feels off balance. She tries to hydrate. She denies orthopnea, PND, or LE edema. Maternal grandmother had heart failure. She was a smoker. Father smoker. She does snore, no sleep study.She wears a mouth piece.    10/01/2020 LDL 121 HDL 43 TG 209 Crt 0.9 TSH 4.6 (01/14/2021)  CVD Risk/Equivalent: HLD- yes HTN- no PAD- No DMII- No Smoker-No Stroke-No Premature Family History- Sister CABG3v, non smoker. 44 Pregnancies - none  No family SCD  Interim:  She went to urgent care for chest pain off and on. She takes tylenol. She says she tries to sleep it off. She says pain his right and left sided. The pain is not daily. It can occur during the day. She plans to start a physical activity training program   Past  Medical History:  Diagnosis Date   Allergy    Anxiety    Depression    Hypertension     Past Surgical History:  Procedure Laterality Date   CHOLECYSTECTOMY      Current Medications: No outpatient medications have been marked as taking for the 06/19/21 encounter (Appointment) with Maisie Fus, MD.     Allergies:   Patient has no known allergies.   Social History   Socioeconomic History   Marital status: Single    Spouse name: Not on file   Number of children: Not on file   Years of education: Not on file   Highest education level: Not on file  Occupational History   Not on file  Tobacco Use   Smoking status: Never   Smokeless tobacco: Never  Vaping Use   Vaping Use: Never used  Substance and Sexual Activity   Alcohol use: Not Currently   Drug use: Never   Sexual activity: Not Currently  Other Topics Concern   Not on file  Social History Narrative   Not on file   Social Determinants of Health   Financial Resource Strain: Low Risk    Difficulty of Paying Living Expenses: Not very hard  Food Insecurity: No Food Insecurity   Worried About Running Out of Food in the Last Year:  Never true   Ran Out of Food in the Last Year: Never true  Transportation Needs: Unmet Transportation Needs   Lack of Transportation (Medical): Yes   Lack of Transportation (Non-Medical): Yes  Physical Activity: Inactive   Days of Exercise per Week: 0 days   Minutes of Exercise per Session: 0 min  Stress: Stress Concern Present   Feeling of Stress : To some extent  Social Connections: Socially Isolated   Frequency of Communication with Friends and Family: More than three times a week   Frequency of Social Gatherings with Friends and Family: Twice a week   Attends Religious Services: Never   Database administrator or Organizations: No   Attends Engineer, structural: Never   Marital Status: Never married     Family History: The patient's family history includes Cancer in her  maternal grandfather and paternal grandmother; Diabetes in her father, maternal grandfather, mother, and sister; Heart disease in her paternal grandfather and paternal grandmother; Hyperlipidemia in her father, mother, and sister; Hypertension in her father and mother; Intellectual disability in her father, maternal grandmother, mother, and paternal grandmother; Stroke in her maternal grandfather.  ROS:   Please see the history of present illness.     All other systems reviewed and are negative.  EKGs/Labs/Other Studies Reviewed:    The following studies were reviewed today:   EKG:  EKG is  ordered today.  The ekg ordered today demonstrates   NSR, no ischemic changes  NSR, no ischemic charges  Recent Labs: No results found for requested labs within last 8760 hours.  Recent Lipid Panel    Component Value Date/Time   CHOL 182 11/19/2016 1918   CHOL 208 (H) 02/27/2013 1426   TRIG 96 11/19/2016 1918   TRIG 99 02/27/2013 1426   HDL 48 11/19/2016 1918   HDL 54 02/27/2013 1426   CHOLHDL 3.8 11/19/2016 1918   VLDL 20 02/27/2013 1426   LDLCALC 115 (H) 11/19/2016 1918   LDLCALC 134 (H) 02/27/2013 1426     Risk Assessment/Calculations:           Physical Exam:    VS:  Vitals:   06/19/21 1507  BP: 130/86  Pulse: 81  SpO2: 98%    Wt Readings from Last 3 Encounters:  03/25/21 222 lb 12.8 oz (101.1 kg)  03/17/21 225 lb (102.1 kg)  12/14/20 230 lb (104.3 kg)     GEN:  Well nourished, well developed in no acute distress HEENT: Normal LYMPHATICS: No lymphadenopathy CARDIAC: RRR, no murmurs, rubs, gallops RESPIRATORY:  Clear to auscultation without rales, wheezing or rhonchi  ABDOMEN: Soft, non-tender, non-distended MUSCULOSKELETAL:  No edema; No deformity  SKIN: Warm and dry NEUROLOGIC:  Alert and oriented x 3 PSYCHIATRIC:  Normal affect   ASSESSMENT:    #Chest pain: Mrs Ocasio has recurrent chest pain with frequent ED visits. Her sister had CABG in her early 1s.  Will opt to assess if she has any significant CAD. Continue to recommend balanced diet and increasing physical activity. We discussed that she can increase her physical activity with the training program unless she is having symptoms  PLAN:    In order of problems listed above:  Coronary CTA / FFR Follow up PRN      Medication Adjustments/Labs and Tests Ordered: Current medicines are reviewed at length with the patient today.  Concerns regarding medicines are outlined above.    Signed, Maisie Fus, MD  06/19/2021 11:25 AM    [facility]

## 2021-06-19 NOTE — Patient Instructions (Addendum)
Medication Instructions:  Continue same medications   Lab Work: Bmet today   Testing/Procedures: Coronary CT will be scheduled after approved by insurance    Follow instructions below   Follow-Up: At [facility], you and your health needs are our priority.  As part of our continuing mission to provide you with exceptional heart care, we have created designated Provider Care Teams.  These Care Teams include your primary Cardiologist (physician) and Advanced Practice Providers (APPs -  Physician Assistants and Nurse Practitioners) who all work together to provide you with the care you need, when you need it.  We recommend signing up for the patient portal called "MyChart".  Sign up information is provided on this After Visit Summary.  MyChart is used to connect with patients for Virtual Visits (Telemedicine).  Patients are able to view lab/test results, encounter notes, upcoming appointments, etc.  Non-urgent messages can be sent to your provider as well.   To learn more about what you can do with MyChart, go to [email or web address].    Your next appointment:  As Needed    The format for your next appointment: Office   Provider:  Dr.Branch    Your cardiac CT will be scheduled at one of the below locations:   [facility] [address] [place], [place] [address] [phone]  [facility] [address] [place], [place] [address] [phone]  If scheduled at [facility], please arrive at the [facility] main entrance (entrance A) of [facility] 30 minutes prior to test start time. You can use the FREE valet parking offered at the main entrance (encouraged to control the heart rate for the test) Proceed to the [facility] Radiology Department (first floor) to check-in and test prep.  If scheduled at [facility], please arrive 15 mins early for check-in and test  prep.  Please follow these instructions carefully (unless otherwise directed):    On the Night Before the Test: Be sure to Drink plenty of water. Do not consume any caffeinated/decaffeinated beverages or chocolate 12 hours prior to your test. Do not take any antihistamines 12 hours prior to your test.   On the Day of the Test: Drink plenty of water until 1 hour prior to the test. Do not eat any food 4 hours prior to the test. You may take your regular medications prior to the test.  Take metoprolol 100 mg two hours prior to test. HOLD Hydrochlorothiazide morning of the test. FEMALES- please wear underwire-free bra if available, avoid dresses & tight clothing          After the Test: Drink plenty of water. After receiving IV contrast, you may experience a mild flushed feeling. This is normal. On occasion, you may experience a mild rash up to 24 hours after the test. This is not dangerous. If this occurs, you can take Benadryl 25 mg and increase your fluid intake. If you experience trouble breathing, this can be serious. If it is severe call 911 IMMEDIATELY. If it is mild, please call our office.   We will call to schedule your test 2-4 weeks out understanding that some insurance companies will need an authorization prior to the service being performed.   For non-scheduling related questions, please contact the cardiac imaging nurse navigator should you have any questions/concerns: Marchia Bond, Cardiac Imaging Nurse Navigator Gordy Clement, Cardiac Imaging Nurse Navigator [facility] Direct Office Dial: [phone]  For scheduling needs, including cancellations and rescheduling, please call [place], [phone].

## 2021-06-23 ENCOUNTER — Ambulatory Visit: Payer: Self-pay | Admitting: Internal Medicine

## 2021-06-24 ENCOUNTER — Telehealth (INDEPENDENT_AMBULATORY_CARE_PROVIDER_SITE_OTHER): Payer: 59 | Admitting: Physician Assistant

## 2021-06-24 ENCOUNTER — Encounter (HOSPITAL_COMMUNITY): Payer: Self-pay | Admitting: Physician Assistant

## 2021-06-24 DIAGNOSIS — F4310 Post-traumatic stress disorder, unspecified: Secondary | ICD-10-CM | POA: Diagnosis not present

## 2021-06-24 DIAGNOSIS — F902 Attention-deficit hyperactivity disorder, combined type: Secondary | ICD-10-CM

## 2021-06-24 DIAGNOSIS — F411 Generalized anxiety disorder: Secondary | ICD-10-CM | POA: Diagnosis not present

## 2021-06-24 MED ORDER — SERTRALINE HCL 50 MG PO TABS: 150.0000 mg | ORAL_TABLET | Freq: Every day | ORAL | 2 refills | Status: DC

## 2021-06-24 MED ORDER — BUPROPION HCL ER (XL) 150 MG PO TB24: 150.0000 mg | ORAL_TABLET | ORAL | 2 refills | Status: DC

## 2021-06-24 MED ORDER — ATOMOXETINE HCL 60 MG PO CAPS: 60.0000 mg | ORAL_CAPSULE | Freq: Every day | ORAL | 2 refills | Status: DC

## 2021-06-24 MED ORDER — HYDROXYZINE HCL 10 MG PO TABS: ORAL_TABLET | ORAL | 1 refills | Status: DC

## 2021-06-24 NOTE — Progress Notes (Signed)
BH MD/PA/NP OP Progress Note  Virtual Visit via Video Note  I connected with Angie Roberts on 06/24/21 at 11:30 AM EST by a video enabled telemedicine application and verified that I am speaking with the correct person using two identifiers.  Location: Patient: Home Provider: Clinic   I discussed the limitations of evaluation and management by telemedicine and the availability of in person appointments. The patient expressed understanding and agreed to proceed.  Follow Up Instructions:  I discussed the assessment and treatment plan with the patient. The patient was provided an opportunity to ask questions and all were answered. The patient agreed with the plan and demonstrated an understanding of the instructions.   The patient was advised to call back or seek an in-person evaluation if the symptoms worsen or if the condition fails to improve as anticipated.  I provided 26 minutes of non-face-to-face time during this encounter.  Meta Hatchet, PA    06/24/2021 10:28 PM Angie Roberts  MRN:  [id number]  Chief Complaint: Follow up and medication management  HPI:   Angie Roberts is a 41 year old female with a past psychiatric history significant for generalized anxiety disorder, PTSD, and attention deficit hyperactivity disorder who presents to [facility] via virtual video visit for follow-up and medication management.  Patient is currently being managed on the following medications:  Hydroxyzine 10 mg 3 times daily as needed Sertraline 150 mg daily Strattera 40 mg daily  Patient reports that she has been experiencing upper respiratory issues mostly characterized by chest pain.  Patient feels that her upper respiratory symptom is due to her worsening anxiety.  Patient has had multiple ED visits to address her concerns but was always medically cleared.  Patient states that she will be undergoing a CT scan to see if there  is any underlying issues for chest pain.  Patient endorses feeling a little down due to experiencing difficulty with getting things done.  Patient states that she also notes that she has been procrastinating more and has found it difficult to get out of bed.  Patient has spoken with the neurologist in the past to discuss if her issues with memory are a neurological problem.  After meeting with a neurologist, it was determined that her symptoms were mostly psychological in nature and was told to follow up with provider to increase her dosage of Strattera to improve her attention and concentration.  Patient states that she has experienced some improvements in her concentration since being placed on Strattera.  She also notes that she has also experienced calmness when taking the medication.  Patient states that she still continues to struggle with hyper fixation, racing thoughts, and worrying excessively.  Patient rates her anxiety at 3 out of 10 and states that she has gotten better about managing her anxiety than in the past.  A PHQ-9 screen was performed with the patient scoring a 12.  A GAD-7 screen was also performed with the patient scoring an 11.  Patient is alert and oriented x4, calm, cooperative, and fully engaged in conversation during the encounter.  Patient states that she is not too happy due to having a lot to do and feeling like she has no time for herself.  Patient denies suicidal or homicidal ideations.  She further denies auditory or visual hallucinations and does not appear to be responding to internal/external stimuli.  Patient endorses fair sleep and receives on average 6 hours of sleep each night.  Patient  endorses decreased appetite stating that she has roughly 2 meals per day.  Patient denies alcohol consumption, tobacco use, and illicit drug use.  Visit Diagnosis:    ICD-10-CM   1. Attention deficit hyperactivity disorder (ADHD), combined type  F90.2 atomoxetine (STRATTERA) 60 MG  capsule    sertraline (ZOLOFT) 50 MG tablet    buPROPion (WELLBUTRIN XL) 150 MG 24 hr tablet    2. GAD (generalized anxiety disorder)  F41.1 sertraline (ZOLOFT) 50 MG tablet    hydrOXYzine (ATARAX) 10 MG tablet    buPROPion (WELLBUTRIN XL) 150 MG 24 hr tablet    3. PTSD (post-traumatic stress disorder)  F43.10 sertraline (ZOLOFT) 50 MG tablet      Past Psychiatric History:  PTSD Attention deficit hyperactivity disorder, combined type Generalized anxiety disorder  Past Medical History:  Past Medical History:  Diagnosis Date   Allergy    Anxiety    Depression    Hypertension     Past Surgical History:  Procedure Laterality Date   CHOLECYSTECTOMY      Family Psychiatric History:  Unknown  Family History:  Family History  Problem Relation Age of Onset   Diabetes Mother    Hyperlipidemia Mother    Hypertension Mother    Intellectual disability Mother    Diabetes Father    Hyperlipidemia Father    Hypertension Father    Intellectual disability Father    Diabetes Sister    Intellectual disability Maternal Grandmother    Cancer Maternal Grandfather    Stroke Maternal Grandfather    Diabetes Maternal Grandfather    Intellectual disability Paternal Grandmother    Heart disease Paternal Grandmother    Cancer Paternal Grandmother    Heart disease Paternal Grandfather    Hyperlipidemia Sister     Social History:  Social History   Socioeconomic History   Marital status: Single    Spouse name: Not on file   Number of children: Not on file   Years of education: Not on file   Highest education level: Not on file  Occupational History   Not on file  Tobacco Use   Smoking status: Never   Smokeless tobacco: Never  Vaping Use   Vaping Use: Never used  Substance and Sexual Activity   Alcohol use: Not Currently   Drug use: Never   Sexual activity: Not Currently  Other Topics Concern   Not on file  Social History Narrative   Not on file   Social Determinants  of Health   Financial Resource Strain: Low Risk    Difficulty of Paying Living Expenses: Not very hard  Food Insecurity: No Food Insecurity   Worried About Programme researcher, broadcasting/film/video in the Last Year: Never true   Ran Out of Food in the Last Year: Never true  Transportation Needs: Unmet Transportation Needs   Lack of Transportation (Medical): Yes   Lack of Transportation (Non-Medical): Yes  Physical Activity: Inactive   Days of Exercise per Week: 0 days   Minutes of Exercise per Session: 0 min  Stress: Stress Concern Present   Feeling of Stress : To some extent  Social Connections: Socially Isolated   Frequency of Communication with Friends and Family: More than three times a week   Frequency of Social Gatherings with Friends and Family: Twice a week   Attends Religious Services: Never   Database administrator or Organizations: No   Attends Banker Meetings: Never   Marital Status: Never married    Allergies:  Allergies  Allergen Reactions   Lactose Nausea And Vomiting    PT states milk/ dairy products "causes a lot of bloating and gas." PT states milk/ dairy products "causes a lot of bloating and gas."    Milk-Related Compounds Other (See Comments)   Penicillins Other (See Comments)    As a child, unsure of reaction     Metabolic Disorder Labs: Lab Results  Component Value Date   HGBA1C 6.1 02/27/2013   No results found for: PROLACTIN Lab Results  Component Value Date   CHOL 182 11/19/2016   TRIG 96 11/19/2016   HDL 48 11/19/2016   CHOLHDL 3.8 11/19/2016   VLDL 20 02/27/2013   LDLCALC 115 (H) 11/19/2016   LDLCALC 134 (H) 02/27/2013   Lab Results  Component Value Date   TSH 2.850 11/19/2016    Therapeutic Level Labs: No results found for: LITHIUM No results found for: VALPROATE No components found for:  CBMZ  Current Medications: Current Outpatient Medications  Medication Sig Dispense Refill   buPROPion (WELLBUTRIN XL) 150 MG 24 hr tablet Take 1  tablet (150 mg total) by mouth every morning. 30 tablet 2   acetaminophen (TYLENOL) 325 MG tablet Take 2 tablets (650 mg total) by mouth every 6 (six) hours as needed. 30 tablet 0   atomoxetine (STRATTERA) 60 MG capsule Take 1 capsule (60 mg total) by mouth daily. 30 capsule 2   clonazePAM (KLONOPIN) 0.5 MG tablet Take 0.5 tablets (0.25 mg total) by mouth daily. 2 tablet 0   dextromethorphan (DELSYM) 30 MG/5ML liquid Take 60 mg by mouth every 12 (twelve) hours as needed for cough.     Glycopyrronium Tosylate (QBREXZA) 2.4 % PADS      hydrochlorothiazide (HYDRODIURIL) 25 MG tablet Take 25 mg by mouth 2 (two) times daily.     hydrOXYzine (ATARAX) 10 MG tablet TAKE 1 TABLET(10 MG) BY MOUTH THREE TIMES DAILY AS NEEDED 75 tablet 1   loratadine (CLARITIN) 10 MG tablet Take 10 mg by mouth daily.     metoprolol tartrate (LOPRESSOR) 100 MG tablet Take 100 mg 2 hours before coronary ct 1 tablet 0   sertraline (ZOLOFT) 50 MG tablet Take 3 tablets (150 mg total) by mouth daily. 90 tablet 2   No current facility-administered medications for this visit.     Musculoskeletal: Strength & Muscle Tone: within normal limits Gait & Station: normal Patient leans: N/A  Psychiatric Specialty Exam: Review of Systems  Psychiatric/Behavioral:  Positive for decreased concentration and sleep disturbance. Negative for dysphoric mood, hallucinations, self-injury and suicidal ideas. The patient is nervous/anxious. The patient is not hyperactive.    There were no vitals taken for this visit.There is no height or weight on file to calculate BMI.  General Appearance: Fairly Groomed  Eye Contact:  Good  Speech:  Clear and Coherent and Normal Rate  Volume:  Normal  Mood:  Anxious and Depressed  Affect:  Congruent and Depressed  Thought Process:  Coherent, Goal Directed, and Descriptions of Associations: Intact  Orientation:  Full (Time, Place, and Person)  Thought Content: WDL   Suicidal Thoughts:  No  Homicidal  Thoughts:  No  Memory:  Immediate;   Good Recent;   Fair Remote;   Fair  Judgement:  Good  Insight:  Good  Psychomotor Activity:  Normal  Concentration:  Concentration: Good and Attention Span: Good  Recall:  Good  Fund of Knowledge: Good  Language: Good  Akathisia:  No  Handed:  Right  AIMS (if  indicated): not done  Assets:  Communication Skills Desire for Improvement Resilience Social Support  ADL's:  Intact  Cognition: WNL  Sleep:  Fair   Screenings: GAD-7    Flowsheet Row [facility]  Total GAD-7 Score 11 5 9 12       PHQ2-9    Flowsheet Row [facility]  PHQ-2 Total Score 4 4 1 1 3   PHQ-9 Total Score 12 10 -- -- 13      Flowsheet Row [facility]  C-SSRS RISK CATEGORY No Risk No Risk No Risk        Assessment and Plan:   Angie Roberts is a 41 year old female with a past psychiatric history significant for generalized anxiety disorder, PTSD, and attention deficit hyperactivity disorder who [facility] virtual video visit for follow-up and medication management.  Patient states that she has experienced some  improvements in the management of her attention/concentration through the use of Strattera.  Patient still continues to endorse issues with attention/concentration and states that she has noticed some depressive symptoms related to her inability to get tasks done.  Patient's anxiety appears to be more manageable at this time.  Provider recommended patient be placed on Wellbutrin 150 mg daily for the management of her depressive symptoms as well as ADHD symptoms.  Provider to increase patient's Strattera dosage from 40 mg to 60 mg daily to improve attention/concentration.  Patient was agreeable to recommendations.  Patient's medications to be e-prescribed to pharmacy of choice.  1. Attention deficit hyperactivity disorder (ADHD), combined type  - atomoxetine (STRATTERA) 60 MG capsule; Take 1 capsule (60 mg total) by mouth daily.  Dispense: 30 capsule; Refill: 2 - sertraline (ZOLOFT) 50 MG tablet; Take 3 tablets (150 mg total) by mouth daily.  Dispense: 90 tablet; Refill: 2 - buPROPion (WELLBUTRIN XL) 150 MG 24 hr tablet; Take 1 tablet (150 mg total) by mouth every morning.  Dispense: 30 tablet; Refill: 2  2. GAD (generalized anxiety disorder)  - sertraline (ZOLOFT) 50 MG tablet; Take 3 tablets (150 mg total) by mouth daily.  Dispense: 90 tablet; Refill: 2 - hydrOXYzine (ATARAX) 10 MG tablet; TAKE 1 TABLET(10 MG) BY MOUTH THREE TIMES DAILY AS NEEDED  Dispense: 75 tablet; Refill: 1 - buPROPion (WELLBUTRIN XL) 150 MG 24 hr tablet; Take 1 tablet (150 mg total) by mouth every morning.  Dispense: 30 tablet; Refill: 2  3. PTSD (post-traumatic stress disorder)  - sertraline (ZOLOFT) 50 MG tablet; Take 3 tablets (150 mg total) by mouth daily.  Dispense: 90 tablet; Refill: 2  Patient to follow up in 6 weeks Provider spent a total of 26 minutes with the patient/reviewing patient's chart  Meta HatchetUchenna E Tirza Senteno, PA 06/24/2021, 10:28 PM

## 2021-06-25 LAB — BASIC METABOLIC PANEL
BUN/Creatinine Ratio: 14 (ref 9–23)
BUN: 13 mg/dL (ref 6–20)
CO2: 28 mmol/L (ref 20–29)
Calcium: 9.4 mg/dL (ref 8.7–10.2)
Chloride: 100 mmol/L (ref 96–106)
Creatinine, Ser: 0.93 mg/dL (ref 0.57–1.00)
Glucose: 90 mg/dL (ref 70–99)
Potassium: 3.7 mmol/L (ref 3.5–5.2)
Sodium: 140 mmol/L (ref 134–144)
eGFR: 80 mL/min/{1.73_m2} (ref >59)

## 2021-07-02 ENCOUNTER — Ambulatory Visit (HOSPITAL_COMMUNITY): Payer: Managed Care, Other (non HMO)

## 2021-07-04 ENCOUNTER — Ambulatory Visit (HOSPITAL_COMMUNITY): Payer: Managed Care, Other (non HMO)

## 2021-07-08 ENCOUNTER — Telehealth (HOSPITAL_COMMUNITY): Payer: Self-pay | Admitting: Emergency Medicine

## 2021-07-08 NOTE — Telephone Encounter (Signed)
Reaching out to patient to offer assistance regarding upcoming cardiac imaging study; pt verbalizes understanding of appt date/time, parking situation and where to check in, pre-test NPO status and medications ordered, and verified current allergies; name and call back number provided for further questions should they arise Rockwell Alexandria RN Navigator Cardiac Imaging [facility] [phone] office [phone] cell  Denies iv issues 100mg  metoprolol tart, holding HCTZ Arrival 400p

## 2021-07-09 ENCOUNTER — Ambulatory Visit (HOSPITAL_COMMUNITY)
Admission: RE | Admit: 2021-07-09 | Discharge: 2021-07-09 | Disposition: A | Discharge status: Home / Self Care | Payer: Commercial Managed Care - HMO | Source: Ambulatory Visit | Attending: Internal Medicine | Admitting: Internal Medicine

## 2021-07-09 ENCOUNTER — Other Ambulatory Visit: Payer: Self-pay

## 2021-07-09 ENCOUNTER — Encounter (HOSPITAL_COMMUNITY): Payer: Self-pay

## 2021-07-09 DIAGNOSIS — R079 Chest pain, unspecified: Secondary | ICD-10-CM | POA: Insufficient documentation

## 2021-07-09 DIAGNOSIS — R072 Precordial pain: Secondary | ICD-10-CM

## 2021-07-09 DIAGNOSIS — Z01812 Encounter for preprocedural laboratory examination: Secondary | ICD-10-CM | POA: Diagnosis not present

## 2021-07-09 IMAGING — CT CT HEART MORP W/ CTA COR W/ SCORE W/ CA W/CM &/OR W/O CM
4 of 7 series · 8 of 20 positions shown, 9 images · IV contrast (APPLIED)
Comparison: None.

Addendum:
CLINICAL DATA: Chest pain

EXAM:
Cardiac CTA
MEDICATIONS:
Sub lingual nitro. 4mg x 2
TECHNIQUE: The patient was scanned on a Siemens [REDACTED]ice scanner. Gantry
rotation speed was 250 msecs. Collimation was 0.6 mm. A 100 kV
prospective scan was triggered in the ascending thoracic aorta at
35-75% of the R-R interval. Average HR during the scan was 60 bpm.
The 3D data set was interpreted on a dedicated work station using
MPR, MIP and VRT modes. A total of 80cc of contrast was used.

[Series 6: best diast 75 % · axial · 0.39mm/px · z∈[-113,-78]mm · 2 of 261 slices shown]
[im 87/261  vessel]
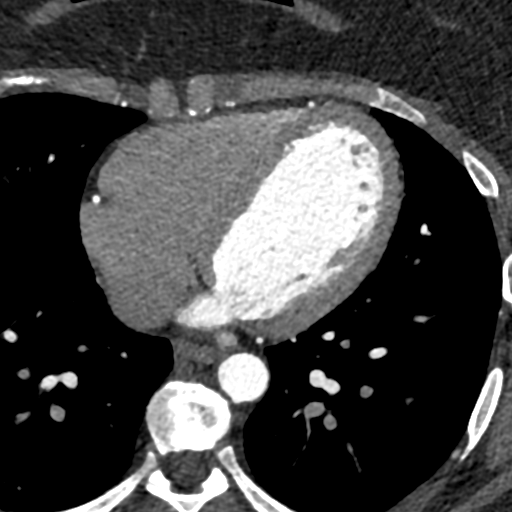
[im 174/261  vessel]
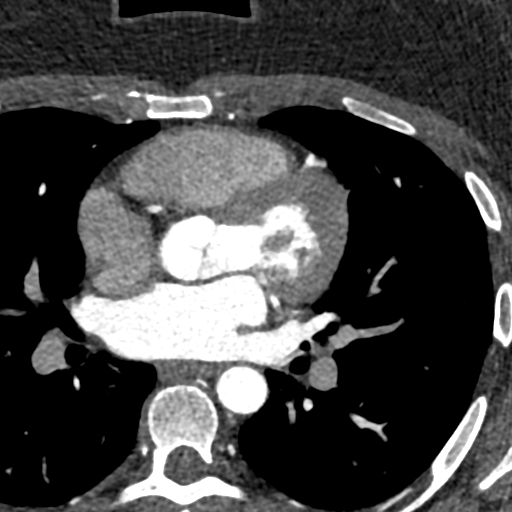

[Series 7: best syst · axial · 0.39mm/px · z∈[-113,-78]mm · 2 of 261 slices shown, 3 images]
[im 87/261  vessel]
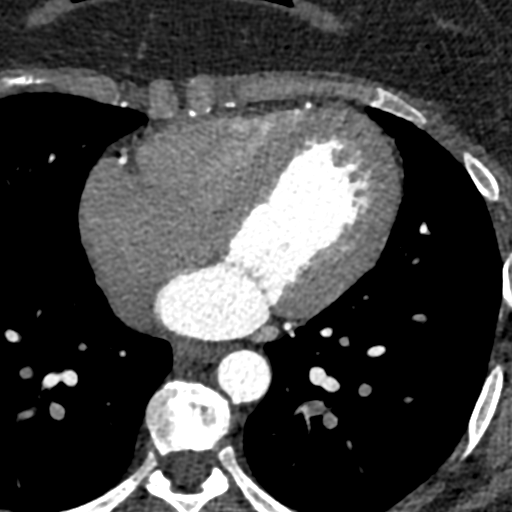
[im 87/261  lung]
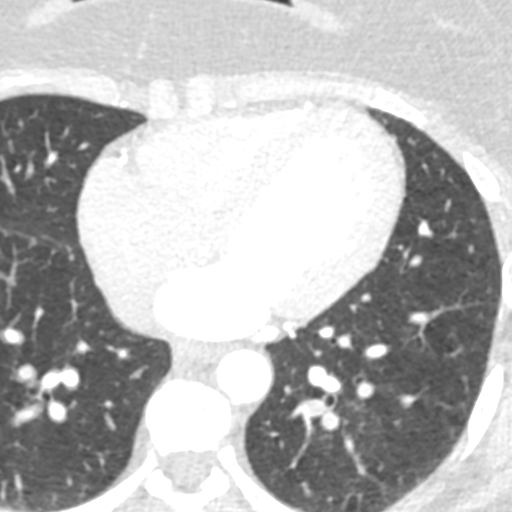
[im 174/261  vessel]
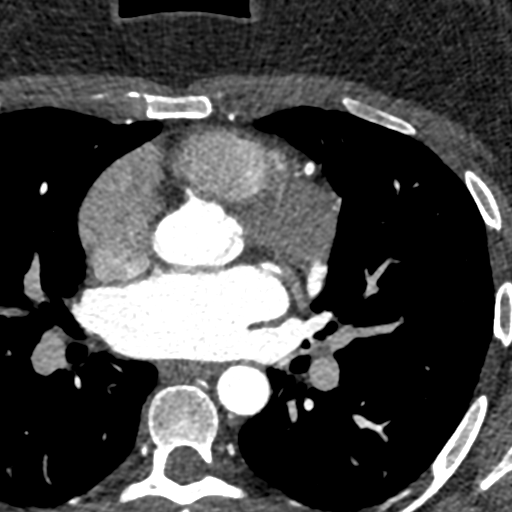

[Series 8: ts syst sharp · axial · 0.39mm/px · z∈[-113,-78]mm · 2 of 261 slices shown]
[im 87/261  lung]
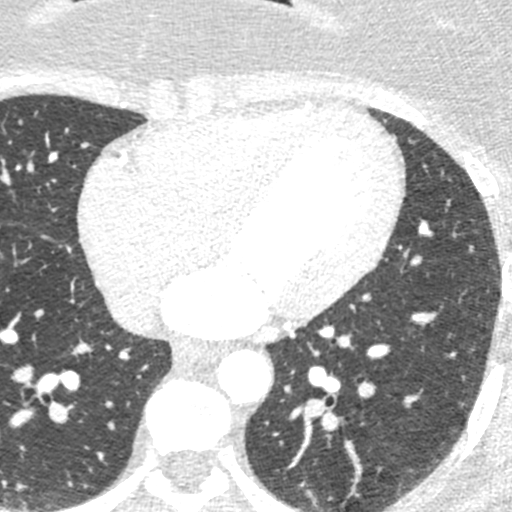
[im 174/261  lung]
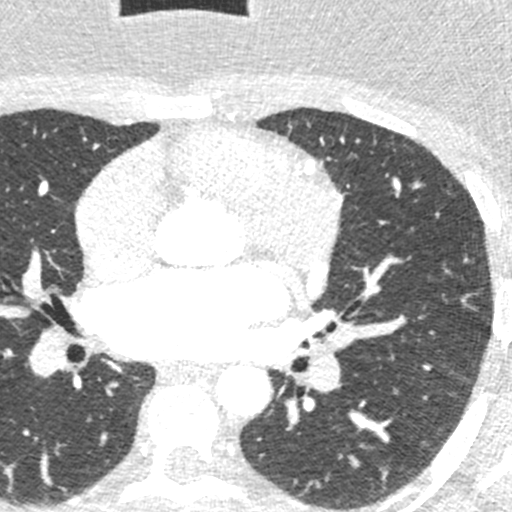

[Series 9: ts diast sharp · axial · 0.39mm/px · z∈[-113,-78]mm · 2 of 261 slices shown]
[im 87/261  lung]
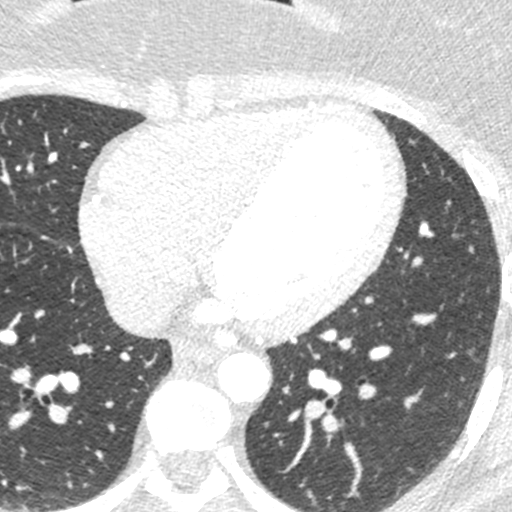
[im 174/261  lung]
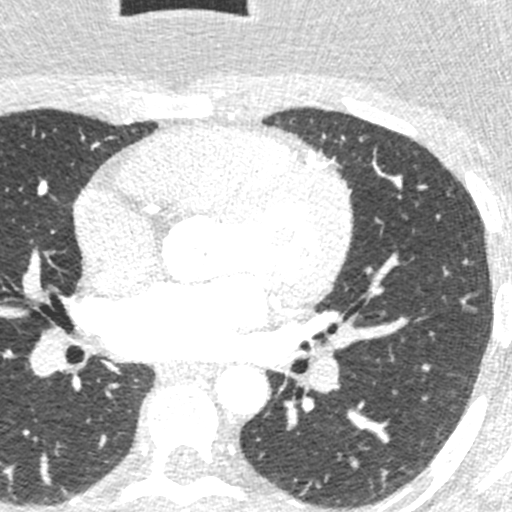

[8 of 20 positions shown; findings below may reference images not displayed]

FINDINGS: Non-cardiac: See separate report from [REDACTED].

No LA appendage thrombus noted. Pulmonary veins drain normally to
the left atrium.

Calcium Score: 0 Agatston units.

Coronary Arteries: Right dominant with no anomalies

LM: No plaque or stenosis.

LAD system:  No plaque or stenosis.

Circumflex system: No plaque or stenosis.

RCA system:  No plaque or stenosis.
IMPRESSION: 1. Coronary artery calcium score 0 Agatston units. This suggests low
risk for future cardiac events.

2.  No significant coronary disease noted.

JIM

ADDENDUM:
The following report is an over-read performed by radiologist Dr.
over-read does not include interpretation of cardiac or coronary
anatomy or pathology. The coronary calcium score/coronary CTA
interpretation by the cardiologist is attached.
FINDINGS: Vascular: Visualized portions of the thoracic aorta are normal in
caliber. No central pulmonary embolus.

Mediastinum/Nodes: No mid/lower mediastinal or hilar adenopathy.
Visualized portions of the esophagus are grossly unremarkable.

Lungs/Pleura: No suspicious pulmonary nodules or masses in the
included lung fields. No visible pleural effusion or pneumothorax.

Upper Abdomen: No acute abnormality

Musculoskeletal: No acute osseous abnormality.
IMPRESSION: No acute abnormality or suspicious pulmonary nodules/masses.

*** End of Addendum ***
FINDINGS: Non-cardiac: See separate report from [REDACTED].

No LA appendage thrombus noted. Pulmonary veins drain normally to
the left atrium.

Calcium Score: 0 Agatston units.

Coronary Arteries: Right dominant with no anomalies

LM: No plaque or stenosis.

LAD system:  No plaque or stenosis.

Circumflex system: No plaque or stenosis.

RCA system:  No plaque or stenosis.
IMPRESSION: 1. Coronary artery calcium score 0 Agatston units. This suggests low
risk for future cardiac events.

2.  No significant coronary disease noted.

JIM

## 2021-07-09 MED ORDER — IOHEXOL 350 MG/ML SOLN
95.0000 mL | Freq: Once | INTRAVENOUS | Status: AC | PRN
  Administered 2021-07-09: 95 mL via INTRAVENOUS

## 2021-07-09 MED ORDER — NITROGLYCERIN 0.4 MG SL SUBL
SUBLINGUAL_TABLET | SUBLINGUAL | Status: AC
  Filled 2021-07-09: qty 2

## 2021-07-09 MED ORDER — NITROGLYCERIN 0.4 MG SL SUBL
0.8000 mg | SUBLINGUAL_TABLET | Freq: Once | SUBLINGUAL | Status: AC
  Administered 2021-07-09: 0.8 mg via SUBLINGUAL

## 2021-07-19 ENCOUNTER — Other Ambulatory Visit (HOSPITAL_COMMUNITY): Payer: Self-pay | Admitting: Physician Assistant

## 2021-07-19 DIAGNOSIS — F4310 Post-traumatic stress disorder, unspecified: Secondary | ICD-10-CM

## 2021-07-19 DIAGNOSIS — F411 Generalized anxiety disorder: Secondary | ICD-10-CM

## 2021-07-19 DIAGNOSIS — F902 Attention-deficit hyperactivity disorder, combined type: Secondary | ICD-10-CM

## 2021-08-04 ENCOUNTER — Telehealth (HOSPITAL_COMMUNITY): Payer: Self-pay | Admitting: *Deleted

## 2021-08-04 NOTE — Telephone Encounter (Signed)
Expressed Scripts Prior Authorization department faxed Memo stating ?atomoxetine (STRATTERA) 60 MG capsule ?Prior Authorization is not Required. Plan's limit for this medication is not being exceeded @ current prescribed dose ? ?Auto Fax Case Id# [id number] ?

## 2021-08-08 ENCOUNTER — Encounter (HOSPITAL_COMMUNITY): Payer: Self-pay | Admitting: Physician Assistant

## 2021-08-08 ENCOUNTER — Telehealth (INDEPENDENT_AMBULATORY_CARE_PROVIDER_SITE_OTHER): Payer: 59 | Admitting: Physician Assistant

## 2021-08-08 DIAGNOSIS — F902 Attention-deficit hyperactivity disorder, combined type: Secondary | ICD-10-CM

## 2021-08-08 DIAGNOSIS — F4310 Post-traumatic stress disorder, unspecified: Secondary | ICD-10-CM | POA: Diagnosis not present

## 2021-08-08 DIAGNOSIS — F411 Generalized anxiety disorder: Secondary | ICD-10-CM

## 2021-08-08 NOTE — Progress Notes (Addendum)
BH MD/PA/NP OP Progress Note ? ?Virtual Visit via Telephone Note ? ?I connected with Angie Roberts on 08/08/21 at  2:00 PM EDT by telephone and verified that I am speaking with the correct person using two identifiers. ? ?Location: ?Patient: Home ?Provider: Clinic ?  ?I discussed the limitations, risks, security and privacy concerns of performing an evaluation and management service by telephone and the availability of in person appointments. I also discussed with the patient that there may be a patient responsible charge related to this service. The patient expressed understanding and agreed to proceed. ? ?Follow Up Instructions: ?  ?I discussed the assessment and treatment plan with the patient. The patient was provided an opportunity to ask questions and all were answered. The patient agreed with the plan and demonstrated an understanding of the instructions. ?  ?The patient was advised to call back or seek an in-person evaluation if the symptoms worsen or if the condition fails to improve as anticipated. ? ?I provided 24 minutes of non-face-to-face time during this encounter. ? ?Malachy Mood, PA  ? ? ?08/08/2021 11:00 PM ?[place]  ?MRN:  [id number] ? ?Chief Complaint:  ?Chief Complaint  ?Patient presents with  ? Follow-up  ? Medication Management  ? ?HPI:  ? ?Angie Roberts is a 41 year old female with a past psychiatric history significant for attention deficit hyperactivity disorder (combined type), generalized anxiety disorder, and PTSD who presents to [facility] via virtual telephone visit for follow-up medication management.  Patient is currently being managed on the following medications: ? ?Atomoxetine 60 mg daily ?Sertraline 150 mg daily ?Bupropion (Wellbutrin XL) 150 mg 24-hour tablet daily ?Hydroxyzine 10 mg 3 times daily as needed ? ?Patient reports that she was experiencing dizziness, vomiting, and sweating when going without food  and taking her medications.  Patient states that she has not experienced the symptoms now that she takes her medications with food.  Patient endorses depression stating that she has been having a few down spells for the past couple of weeks.  She attributes her depressive symptoms to having a hard time dealing with her compulsion characterized by stalking through social media. ? ?Patient endorses minimal anxiety and rates her current anxiety at 3 out of 10.  She states that her anxiety is still triggered by a number of things.  Patient stressors include trying to go back to school as well as having a hard time retaining knowledge and information.  A GAD-7 screen was performed with the patient scoring a 12. ? ?Patient is alert and oriented x4, calm, cooperative, and fully engaged in conversation during the encounter.  Patient endorses feeling tired and wanting to sit down.  Patient denies suicidal or homicidal ideations.  She further denies auditory or visual hallucinations and does not appear to be responding to internal/external stimuli.  Patient endorses fair sleep and receives on average 6-1/2 hours of sleep each night.  Patient states on days when she is off, she is able to receive more sleep.  Patient endorses good appetite and eats on average 2-1/2 meals per day.  Patient endorses alcohol consumption sparingly.  Patient denies tobacco use, and illicit drug use. ? ?Visit Diagnosis:  ?  ICD-10-CM   ?1. Attention deficit hyperactivity disorder (ADHD), combined type  F90.2 atomoxetine (STRATTERA) 60 MG capsule  ?  sertraline (ZOLOFT) 100 MG tablet  ?  buPROPion (WELLBUTRIN XL) 150 MG 24 hr tablet  ?  ?2. GAD (generalized anxiety disorder)  F41.1 sertraline (ZOLOFT) 100 MG tablet  ?  buPROPion (WELLBUTRIN XL) 150 MG 24 hr tablet  ?  hydrOXYzine (ATARAX) 10 MG tablet  ?  ?3. PTSD (post-traumatic stress disorder)  F43.10 sertraline (ZOLOFT) 100 MG tablet  ?  ? ? ?Past Psychiatric History:  ?PTSD ?Attention deficit  hyperactivity disorder, combined type ?Generalized anxiety disorder ? ?Past Medical History:  ?Past Medical History:  ?Diagnosis Date  ? Allergy   ? Anxiety   ? Depression   ? Hypertension   ?  ?Past Surgical History:  ?Procedure Laterality Date  ? CHOLECYSTECTOMY    ? ? ?Family Psychiatric History:  ?Unknown ? ?Family History:  ?Family History  ?Problem Relation Age of Onset  ? Diabetes Mother   ? Hyperlipidemia Mother   ? Hypertension Mother   ? Intellectual disability Mother   ? Diabetes Father   ? Hyperlipidemia Father   ? Hypertension Father   ? Intellectual disability Father   ? Diabetes Sister   ? Intellectual disability Maternal Grandmother   ? Cancer Maternal Grandfather   ? Stroke Maternal Grandfather   ? Diabetes Maternal Grandfather   ? Intellectual disability Paternal Grandmother   ? Heart disease Paternal Grandmother   ? Cancer Paternal Grandmother   ? Heart disease Paternal Grandfather   ? Hyperlipidemia Sister   ? ? ?Social History:  ?Social History  ? ?Socioeconomic History  ? Marital status: Single  ?  Spouse name: Not on file  ? Number of children: Not on file  ? Years of education: Not on file  ? Highest education level: Not on file  ?Occupational History  ? Not on file  ?Tobacco Use  ? Smoking status: Never  ? Smokeless tobacco: Never  ?Vaping Use  ? Vaping Use: Never used  ?Substance and Sexual Activity  ? Alcohol use: Not Currently  ? Drug use: Never  ? Sexual activity: Not Currently  ?Other Topics Concern  ? Not on file  ?Social History Narrative  ? Not on file  ? ?Social Determinants of Health  ? ?Financial Resource Strain: Low Risk   ? Difficulty of Paying Living Expenses: Not very hard  ?Food Insecurity: No Food Insecurity  ? Worried About Charity fundraiser in the Last Year: Never true  ? Ran Out of Food in the Last Year: Never true  ?Transportation Needs: Unmet Transportation Needs  ? Lack of Transportation (Medical): Yes  ? Lack of Transportation (Non-Medical): Yes  ?Physical Activity:  Inactive  ? Days of Exercise per Week: 0 days  ? Minutes of Exercise per Session: 0 min  ?Stress: Stress Concern Present  ? Feeling of Stress : To some extent  ?Social Connections: Socially Isolated  ? Frequency of Communication with Friends and Family: More than three times a week  ? Frequency of Social Gatherings with Friends and Family: Twice a week  ? Attends Religious Services: Never  ? Active Member of Clubs or Organizations: No  ? Attends Archivist Meetings: Never  ? Marital Status: Never married  ? ? ?Allergies:  ?Allergies  ?Allergen Reactions  ? Lactose Nausea And Vomiting  ?  PT states milk/ dairy products "causes a lot of bloating and gas." ?PT states milk/ dairy products "causes a lot of bloating and gas." ?  ? Milk-Related Compounds Other (See Comments)  ? Penicillins Other (See Comments)  ?  As a child, unsure of reaction   ? ? ?Metabolic Disorder Labs: ?Lab Results  ?Component Value Date  ?  HGBA1C 6.1 02/27/2013  ? ?No results found for: PROLACTIN ?Lab Results  ?Component Value Date  ? CHOL 182 11/19/2016  ? TRIG 96 11/19/2016  ? HDL 48 11/19/2016  ? CHOLHDL 3.8 11/19/2016  ? VLDL 20 02/27/2013  ? LDLCALC 115 (H) 11/19/2016  ? LDLCALC 134 (H) 02/27/2013  ? ?Lab Results  ?Component Value Date  ? TSH 2.850 11/19/2016  ? ? ?Therapeutic Level Labs: ?No results found for: LITHIUM ?No results found for: VALPROATE ?No components found for:  CBMZ ? ?Current Medications: ?Current Outpatient Medications  ?Medication Sig Dispense Refill  ? acetaminophen (TYLENOL) 325 MG tablet Take 2 tablets (650 mg total) by mouth every 6 (six) hours as needed. 30 tablet 0  ? atomoxetine (STRATTERA) 60 MG capsule Take 1 capsule (60 mg total) by mouth daily. 30 capsule 2  ? buPROPion (WELLBUTRIN XL) 150 MG 24 hr tablet Take 1 tablet (150 mg total) by mouth every morning. 30 tablet 2  ? clonazePAM (KLONOPIN) 0.5 MG tablet Take 0.5 tablets (0.25 mg total) by mouth daily. 2 tablet 0  ? dextromethorphan (DELSYM) 30  MG/5ML liquid Take 60 mg by mouth every 12 (twelve) hours as needed for cough.    ? Glycopyrronium Tosylate (QBREXZA) 2.4 % PADS     ? hydrochlorothiazide (HYDRODIURIL) 25 MG tablet Take 25 mg by mouth 2 (

## 2021-08-11 MED ORDER — SERTRALINE HCL 100 MG PO TABS: 200.0000 mg | ORAL_TABLET | Freq: Every day | ORAL | 2 refills | Status: DC

## 2021-08-11 MED ORDER — HYDROXYZINE HCL 10 MG PO TABS: ORAL_TABLET | ORAL | 1 refills | Status: DC

## 2021-08-11 MED ORDER — BUPROPION HCL ER (XL) 150 MG PO TB24: 150.0000 mg | ORAL_TABLET | ORAL | 2 refills | Status: DC

## 2021-08-11 MED ORDER — ATOMOXETINE HCL 60 MG PO CAPS: 60.0000 mg | ORAL_CAPSULE | Freq: Every day | ORAL | 2 refills | Status: DC

## 2021-08-29 ENCOUNTER — Ambulatory Visit: Payer: Managed Care, Other (non HMO) | Admitting: Internal Medicine

## 2021-09-16 ENCOUNTER — Ambulatory Visit (INDEPENDENT_AMBULATORY_CARE_PROVIDER_SITE_OTHER): Payer: Managed Care, Other (non HMO) | Admitting: Internal Medicine

## 2021-09-16 ENCOUNTER — Encounter: Payer: Self-pay | Admitting: Internal Medicine

## 2021-09-16 VITALS — BP 124/86 | HR 83 | Ht 64.0 in | Wt 216.0 lb

## 2021-09-16 DIAGNOSIS — R768 Other specified abnormal immunological findings in serum: Secondary | ICD-10-CM

## 2021-09-16 LAB — TSH: TSH: 2.08 u[IU]/mL (ref 0.35–5.50)

## 2021-09-16 LAB — T4, FREE: Free T4: 0.64 ng/dL (ref 0.60–1.60)

## 2021-09-16 NOTE — Progress Notes (Signed)
? ? ?Name: Angie Roberts  ?MRN/ DOB: [id number], 04-23-1981    ?Age/ Sex: 41 y.o., female   ? ?PCP: Deatra James, MD   ?Reason for Endocrinology Evaluation: Hashimoto's Disease   ?   ?Date of Initial Endocrinology Evaluation: 09/16/2021   ? ? ?HPI: ?Ms. Angie Roberts is a 41 y.o. female with a past medical history of memory impairment, GAD, ADHD and HTN. The patient presented for initial endocrinology clinic visit on 09/16/2021 for consultative assistance with her Hashimoto's Disease .  ? ?Pt has been referred here for further evaluation of elevated Anti-TPO abs' and TSH.  ? ?Pt states she does not recall why she was referred her as it was a year ago ? ?Weight has been decreasing  ?Denies local neck swelling  ?Denies constipation  ?Denies palpitations  ?She has chronic anxiety  ?No biotin use  ? ? ?No FH of thyroid disease  ? ? ? ? ? ?HISTORY:  ?Past Medical History:  ?Past Medical History:  ?Diagnosis Date  ? Allergy   ? Anxiety   ? Depression   ? Hypertension   ? ?Past Surgical History:  ?Past Surgical History:  ?Procedure Laterality Date  ? CHOLECYSTECTOMY    ?  ?Social History:  reports that she has never smoked. She has never used smokeless tobacco. She reports that she does not currently use alcohol. She reports that she does not use drugs. ?Family History: family history includes Cancer in her maternal grandfather and paternal grandmother; Diabetes in her father, maternal grandfather, mother, and sister; Heart disease in her paternal grandfather and paternal grandmother; Hyperlipidemia in her father, mother, and sister; Hypertension in her father and mother; Intellectual disability in her father, maternal grandmother, mother, and paternal grandmother; Stroke in her maternal grandfather. ? ? ?HOME MEDICATIONS: ?Allergies as of 09/16/2021   ? ?   Reactions  ? Lactose Nausea And Vomiting  ? PT states milk/ dairy products "causes a lot of bloating and gas." ?PT states milk/ dairy products "causes a lot  of bloating and gas."  ? Milk-related Compounds Other (See Comments)  ? Penicillins Other (See Comments)  ? As a child, unsure of reaction   ? ?  ? ?  ?Medication List  ?  ? ?  ? Accurate as of Sep 16, 2021  1:29 PM. If you have any questions, ask your nurse or doctor.  ?  ?  ? ?  ? ?acetaminophen 325 MG tablet ?Commonly known as: Tylenol ?Take 2 tablets (650 mg total) by mouth every 6 (six) hours as needed. ?  ?atomoxetine 60 MG capsule ?Commonly known as: Strattera ?Take 1 capsule (60 mg total) by mouth daily. ?  ?buPROPion 150 MG 24 hr tablet ?Commonly known as: Wellbutrin XL ?Take 1 tablet (150 mg total) by mouth every morning. ?  ?clonazePAM 0.5 MG tablet ?Commonly known as: KlonoPIN ?Take 0.5 tablets (0.25 mg total) by mouth daily. ?  ?dextromethorphan 30 MG/5ML liquid ?Commonly known as: DELSYM ?Take 60 mg by mouth every 12 (twelve) hours as needed for cough. ?  ?hydrochlorothiazide 25 MG tablet ?Commonly known as: HYDRODIURIL ?Take 25 mg by mouth 2 (two) times daily. ?  ?hydrOXYzine 10 MG tablet ?Commonly known as: ATARAX ?TAKE 1 TABLET(10 MG) BY MOUTH THREE TIMES DAILY AS NEEDED ?  ?loratadine 10 MG tablet ?Commonly known as: CLARITIN ?Take 10 mg by mouth daily. ?  ?metoprolol tartrate 100 MG tablet ?Commonly known as: LOPRESSOR ?Take 100 mg 2 hours before coronary ct ?  ?  Qbrexza 2.4 % Pads ?Generic drug: Glycopyrronium Tosylate ?  ?sertraline 100 MG tablet ?Commonly known as: Zoloft ?Take 2 tablets (200 mg total) by mouth daily. ?  ? ?  ?  ? ? ?REVIEW OF SYSTEMS: ?A comprehensive ROS was conducted with the patient and is negative except as per HPI  ? ? ? ?OBJECTIVE:  ?VS: BP 124/86 (BP Location: Left Arm, Patient Position: Sitting, Cuff Size: Large)   Pulse 83   Ht 5\' 4"  (1.626 m)   Wt 216 lb (98 kg)   SpO2 99%   BMI 37.08 kg/m?  ? ? ?Wt Readings from Last 3 Encounters:  ?06/19/21 223 lb (101.2 kg)  ?03/25/21 222 lb 12.8 oz (101.1 kg)  ?03/17/21 225 lb (102.1 kg)  ? ? ? ?EXAM: ?General: Pt appears well  and is in NAD  ?Neck: General: Supple without adenopathy. ?Thyroid: Thyroid size normal.  No goiter or nodules appreciated.   ?Lungs: Clear with good BS bilat with no rales, rhonchi, or wheezes  ?Heart: Auscultation: RRR.  ?Abdomen: Normoactive bowel sounds, soft, nontender, without masses or organomegaly palpable  ?Extremities:  ?BL LE: No pretibial edema normal ROM and strength.  ?Mental Status: Judgment, insight: Intact ?Orientation: Oriented to time, place, and person ?Mood and affect: No depression, anxiety, or agitation  ? ? ? ?DATA REVIEWED: ? ?  ? Latest Reference Range & Units 09/16/21 14:28  ?TSH 0.35 - 5.50 uIU/mL 2.08  ?Triiodothyronine (T3) 76 - 181 ng/dL 89  ?11/16/21) F6,CLEX(NTZGYF - 1.60 ng/dL 7.49  ? ? ?ASSESSMENT/PLAN/RECOMMENDATIONS:  ? ?Hx of Elevated Anti-TPo Ab's ? ?-These records are not available but this is per history ?-Patient is clinically euthyroid ?-TFTs are normal ? ?- I explained to the patient that Hashimoto's Disease is an autoimmune - mediated destruction of the thyroid gland. The usual course of Hashimoto's thyroiditis is the gradual loss of thyroid function. Overt hypothyroidism occurs at a rate of ~ 5% per year.  ?-Patient to return to keep PCPs care, I would recommend annual TFTs  ? ?Signed electronically by: ?Abby 4.49, MD ? ?[facility]  ?[facility] ?[address] ?[place], [place] ?Phone: [phone] ?FAX: [phone] ? ? ?CC: ?[phone], MD ?[phone] ?[place] ?Phone: [phone] ?Fax: [phone] ? ? ?Return to Endocrinology clinic as below: ?Future Appointments  ?Date Time Provider Department Center  ?09/16/2021  2:00 PM Atavia Poppe, 11/16/2021, MD [facility] None  ?10/10/2021  1:00 PM [place], 10/12/2021, PA [facility] None  ?  ? ? ? ? ? ?

## 2021-09-17 ENCOUNTER — Encounter: Payer: Self-pay | Admitting: Internal Medicine

## 2021-09-18 LAB — THYROID PEROXIDASE ANTIBODY: Thyroperoxidase Ab SerPl-aCnc: <1 IU/mL (ref <9)

## 2021-09-18 LAB — T3: T3, Total: 89 ng/dL (ref 76–181)

## 2021-10-07 ENCOUNTER — Other Ambulatory Visit: Payer: Self-pay | Admitting: Family Medicine

## 2021-10-07 DIAGNOSIS — Z1231 Encounter for screening mammogram for malignant neoplasm of breast: Secondary | ICD-10-CM

## 2021-10-10 ENCOUNTER — Telehealth (INDEPENDENT_AMBULATORY_CARE_PROVIDER_SITE_OTHER): Payer: Commercial Managed Care - HMO | Admitting: Physician Assistant

## 2021-10-10 DIAGNOSIS — F902 Attention-deficit hyperactivity disorder, combined type: Secondary | ICD-10-CM

## 2021-10-10 DIAGNOSIS — F4310 Post-traumatic stress disorder, unspecified: Secondary | ICD-10-CM | POA: Diagnosis not present

## 2021-10-10 DIAGNOSIS — F411 Generalized anxiety disorder: Secondary | ICD-10-CM

## 2021-10-10 MED ORDER — HYDROXYZINE HCL 10 MG PO TABS: ORAL_TABLET | ORAL | 1 refills | Status: DC

## 2021-10-10 MED ORDER — SERTRALINE HCL 100 MG PO TABS: 200.0000 mg | ORAL_TABLET | Freq: Every day | ORAL | 2 refills | Status: DC

## 2021-10-10 MED ORDER — ATOMOXETINE HCL 60 MG PO CAPS: 60.0000 mg | ORAL_CAPSULE | Freq: Every day | ORAL | 2 refills | Status: DC

## 2021-10-10 NOTE — Progress Notes (Unsigned)
BH MD/PA/NP OP Progress Note  Virtual Visit via Telephone Note  I connected with Angie Roberts on 10/10/21 at  1:00 PM EDT by telephone and verified that I am speaking with the correct person using two identifiers.  Location: Patient: Home Provider: Clinic   I discussed the limitations, risks, security and privacy concerns of performing an evaluation and management service by telephone and the availability of in person appointments. I also discussed with the patient that there may be a patient responsible charge related to this service. The patient expressed understanding and agreed to proceed.  Follow Up Instructions:   I discussed the assessment and treatment plan with the patient. The patient was provided an opportunity to ask questions and all were answered. The patient agreed with the plan and demonstrated an understanding of the instructions.   The patient was advised to call back or seek an in-person evaluation if the symptoms worsen or if the condition fails to improve as anticipated.  I provided 21 minutes of non-face-to-face time during this encounter.  Meta Hatchet, PA    10/10/2021 11:00 PM Angie Roberts  MRN:  [id number]  Chief Complaint:  No chief complaint on file.  HPI: ***  Angie Roberts is a 41 year old female with a past psychiatric history significant for attention deficit hyperactivity disorder (combined type), generalized anxiety disorder, and PTSD who presents to [facility] via virtual telephone visit for follow-up medication management.  Patient is currently being managed on the following medications:  Atomoxetine 60 mg daily Sertraline 150 mg daily Bupropion (Wellbutrin XL) 150 mg 24-hour tablet daily Hydroxyzine 10 mg 3 times daily as needed  Patient reports that she was experiencing dizziness, vomiting, and sweating when going without food and taking her medications.  Patient states  that she has not experienced the symptoms now that she takes her medications with food.  Patient endorses depression stating that she has been having a few down spells for the past couple of weeks.  She attributes her depressive symptoms to having a hard time dealing with her compulsion characterized by stalking through social media.  Patient endorses minimal anxiety and rates her current anxiety at 3 out of 10.  She states that her anxiety is still triggered by a number of things.  Patient stressors include trying to go back to school as well as having a hard time retaining knowledge and information.  A GAD-7 screen was performed with the patient scoring a 12.  Patient is alert and oriented x4, calm, cooperative, and fully engaged in conversation during the encounter.  Patient endorses feeling tired and wanting to sit down.  Patient denies suicidal or homicidal ideations.  She further denies auditory or visual hallucinations and does not appear to be responding to internal/external stimuli.  Patient endorses fair sleep and receives on average 6-1/2 hours of sleep each night.  Patient states on days when she is off, she is able to receive more sleep.  Patient endorses good appetite and eats on average 2-1/2 meals per day.  Patient endorses alcohol consumption sparingly.  Patient denies tobacco use, and illicit drug use.  Visit Diagnosis:  No diagnosis found.   Past Psychiatric History:  PTSD Attention deficit hyperactivity disorder, combined type Generalized anxiety disorder  Past Medical History:  Past Medical History:  Diagnosis Date   Allergy    Anxiety    Depression    Hypertension     Past Surgical History:  Procedure Laterality Date  CHOLECYSTECTOMY      Family Psychiatric History:  Unknown  Family History:  Family History  Problem Relation Age of Onset   Diabetes Mother    Hyperlipidemia Mother    Hypertension Mother    Intellectual disability Mother    Diabetes Father     Hyperlipidemia Father    Hypertension Father    Intellectual disability Father    Diabetes Sister    Intellectual disability Maternal Grandmother    Cancer Maternal Grandfather    Stroke Maternal Grandfather    Diabetes Maternal Grandfather    Intellectual disability Paternal Grandmother    Heart disease Paternal Grandmother    Cancer Paternal Grandmother    Heart disease Paternal Grandfather    Hyperlipidemia Sister     Social History:  Social History   Socioeconomic History   Marital status: Single    Spouse name: Not on file   Number of children: Not on file   Years of education: Not on file   Highest education level: Not on file  Occupational History   Not on file  Tobacco Use   Smoking status: Never   Smokeless tobacco: Never  Vaping Use   Vaping Use: Never used  Substance and Sexual Activity   Alcohol use: Not Currently   Drug use: Never   Sexual activity: Not Currently  Other Topics Concern   Not on file  Social History Narrative   Not on file   Social Determinants of Health   Financial Resource Strain: Low Risk    Difficulty of Paying Living Expenses: Not very hard  Food Insecurity: No Food Insecurity   Worried About Programme researcher, broadcasting/film/video in the Last Year: Never true   Barista in the Last Year: Never true  Transportation Needs: Unmet Transportation Needs   Lack of Transportation (Medical): Yes   Lack of Transportation (Non-Medical): Yes  Physical Activity: Inactive   Days of Exercise per Week: 0 days   Minutes of Exercise per Session: 0 min  Stress: Stress Concern Present   Feeling of Stress : To some extent  Social Connections: Socially Isolated   Frequency of Communication with Friends and Family: More than three times a week   Frequency of Social Gatherings with Friends and Family: Twice a week   Attends Religious Services: Never   Database administrator or Organizations: No   Attends Banker Meetings: Never   Marital  Status: Never married    Allergies:  Allergies  Allergen Reactions   Lactose Nausea And Vomiting    PT states milk/ dairy products "causes a lot of bloating and gas." PT states milk/ dairy products "causes a lot of bloating and gas."    Milk-Related Compounds Other (See Comments)   Penicillins Other (See Comments)    As a child, unsure of reaction     Metabolic Disorder Labs: Lab Results  Component Value Date   HGBA1C 6.1 02/27/2013   No results found for: PROLACTIN Lab Results  Component Value Date   CHOL 182 11/19/2016   TRIG 96 11/19/2016   HDL 48 11/19/2016   CHOLHDL 3.8 11/19/2016   VLDL 20 02/27/2013   LDLCALC 115 (H) 11/19/2016   LDLCALC 134 (H) 02/27/2013   Lab Results  Component Value Date   TSH 2.08 09/16/2021   TSH 2.850 11/19/2016    Therapeutic Level Labs: No results found for: LITHIUM No results found for: VALPROATE No components found for:  CBMZ  Current Medications: Current Outpatient  Medications  Medication Sig Dispense Refill   acetaminophen (TYLENOL) 325 MG tablet Take 2 tablets (650 mg total) by mouth every 6 (six) hours as needed. 30 tablet 0   atomoxetine (STRATTERA) 60 MG capsule Take 1 capsule (60 mg total) by mouth daily. 30 capsule 2   buPROPion (WELLBUTRIN XL) 150 MG 24 hr tablet Take 1 tablet (150 mg total) by mouth every morning. 30 tablet 2   clonazePAM (KLONOPIN) 0.5 MG tablet Take 0.5 tablets (0.25 mg total) by mouth daily. 2 tablet 0   dextromethorphan (DELSYM) 30 MG/5ML liquid Take 60 mg by mouth every 12 (twelve) hours as needed for cough. (Patient not taking: Reported on 09/16/2021)     Glycopyrronium Tosylate (QBREXZA) 2.4 % PADS      hydrochlorothiazide (HYDRODIURIL) 25 MG tablet Take 25 mg by mouth 2 (two) times daily.     hydrOXYzine (ATARAX) 10 MG tablet TAKE 1 TABLET(10 MG) BY MOUTH THREE TIMES DAILY AS NEEDED 75 tablet 1   loratadine (CLARITIN) 10 MG tablet Take 10 mg by mouth daily.     sertraline (ZOLOFT) 100 MG tablet  Take 2 tablets (200 mg total) by mouth daily. 60 tablet 2   Vitamin D, Ergocalciferol, (DRISDOL) 1.25 MG (50000 UNIT) CAPS capsule Take 50,000 Units by mouth once a week.     No current facility-administered medications for this visit.     Musculoskeletal: Strength & Muscle Tone: Unable to assess due to telemedicine visit Gait & Station: Unable to assess due to telemedicine visit Patient leans: Unable to assess due to telemedicine visit  Psychiatric Specialty Exam: Review of Systems  Psychiatric/Behavioral:  Positive for decreased concentration and sleep disturbance. Negative for dysphoric mood, hallucinations, self-injury and suicidal ideas. The patient is not nervous/anxious and is not hyperactive.    There were no vitals taken for this visit.There is no height or weight on file to calculate BMI.  General Appearance: Unable to assess due to telemedicine visit  Eye Contact:  Unable to assess due to telemedicine visit  Speech:  Clear and Coherent and Normal Rate  Volume:  Normal  Mood:  Anxious and Depressed  Affect:  Congruent  Thought Process:  Coherent, Goal Directed, and Descriptions of Associations: Intact  Orientation:  Full (Time, Place, and Person)  Thought Content: WDL and Obsessions   Suicidal Thoughts:  No  Homicidal Thoughts:  No  Memory:  Immediate;   Good Recent;   Fair Remote;   Fair  Judgement:  Good  Insight:  Good  Psychomotor Activity:  Normal  Concentration:  Concentration: Good and Attention Span: Good  Recall:  Good  Fund of Knowledge: Good  Language: Good  Akathisia:  No  Handed:  Right  AIMS (if indicated): not done  Assets:  Communication Skills Desire for Improvement Resilience Social Support  ADL's:  Intact  Cognition: WNL  Sleep:  Fair   Screenings: GAD-7    Flowsheet Row Video Visit from 10/10/2021 in [facility] Video Visit from 08/08/2021 in [facility] Video Visit from 06/24/2021  in [facility] Video Visit from 05/08/2021 in [facility] Video Visit from 03/05/2021 in [facility]  Total GAD-7 Score 12 12 11 5 9       PHQ2-9    Flowsheet Row Video Visit from 10/10/2021 in [facility] Video Visit from 08/08/2021 in [facility] Video Visit from 06/24/2021 in [facility]  PHQ-2 Total Score 0 1 4 4 1   PHQ-9 Total Score -- -- 12 10 --      Flowsheet Row [facility]  C-SSRS RISK CATEGORY No Risk Low Risk No Risk        Assessment and Plan: ***  Adisynn T. Jackquline Roberts is a 41 year old female with a past psychiatric history significant for attention deficit hyperactivity disorder (combined type), generalized anxiety disorder, and PTSD who [facility] via virtual telephone visit for follow-up medication management.    Collaboration of Care: Collaboration of Care: Medication Management AEB provider managing patient's psychiatric medications, Psychiatrist AEB patient being followed by a mental health provider, and Other provider involved in patient's care AEB patient being followed by a Cardiologist  Patient/Guardian was advised Release of Information must be obtained prior to any record release in order to collaborate their care with an outside provider. Patient/Guardian was advised if they have not already done so to contact the registration department to sign all necessary forms in order for us to release information  regarding their care.   Consent: Patient/Guardian gives verbal consent for treatment and assignment of benefits for services provided during this visit. Patient/Guardian expressed understanding and agreed to proceed.   1. Attention deficit hyperactivity disorder (ADHD), combined type  - atomoxetine (STRATTERA) 60 MG capsule; Take 1 capsule (60 mg total) by mouth daily.  Dispense: 30 capsule; Refill: 2 - sertraline (ZOLOFT) 100 MG tablet; Take 2 tablets (200 mg total) by mouth daily.  Dispense: 60 tablet; Refill: 2  2. GAD (generalized anxiety disorder)  - sertraline (ZOLOFT) 100 MG tablet; Take 2 tablets (200 mg total) by mouth daily.  Dispense: 60 tablet; Refill: 2 - hydrOXYzine (ATARAX) 10 MG tablet; TAKE 1 TABLET(10 MG) BY MOUTH THREE TIMES DAILY AS NEEDED  Dispense: 75 tablet; Refill: 1  3. PTSD (post-traumatic stress disorder)  - sertraline (ZOLOFT) 100 MG tablet; Take 2 tablets (200 mg total) by mouth daily.  Dispense: 60 tablet; Refill: 2  Patient to follow up in 2 months Provider spent a total of 21 minutes with the patient/reviewing patient's chart  Meta HatchetUchenna E Kennet Mccort, PA 10/10/2021, 11:00 PM

## 2021-10-13 ENCOUNTER — Encounter (HOSPITAL_COMMUNITY): Payer: Self-pay | Admitting: Physician Assistant

## 2021-11-07 ENCOUNTER — Ambulatory Visit: Payer: Self-pay | Admitting: Internal Medicine

## 2021-11-27 ENCOUNTER — Inpatient Hospital Stay: Admission: RE | Admit: 2021-11-27 | Payer: Commercial Managed Care - HMO | Source: Ambulatory Visit

## 2021-11-30 ENCOUNTER — Other Ambulatory Visit: Payer: Self-pay

## 2021-11-30 ENCOUNTER — Emergency Department (HOSPITAL_COMMUNITY)
Admission: EM | Admit: 2021-11-30 | Discharge: 2021-12-01 | Disposition: A | Discharge status: Home / Self Care | Payer: Commercial Managed Care - HMO | Attending: Emergency Medicine | Admitting: Emergency Medicine

## 2021-11-30 ENCOUNTER — Encounter (HOSPITAL_COMMUNITY): Payer: Self-pay | Admitting: Emergency Medicine

## 2021-11-30 ENCOUNTER — Emergency Department (HOSPITAL_COMMUNITY): Payer: Commercial Managed Care - HMO

## 2021-11-30 DIAGNOSIS — R0789 Other chest pain: Secondary | ICD-10-CM | POA: Diagnosis present

## 2021-11-30 DIAGNOSIS — Z79899 Other long term (current) drug therapy: Secondary | ICD-10-CM | POA: Insufficient documentation

## 2021-11-30 LAB — BASIC METABOLIC PANEL
Anion gap: 8 (ref 5–15)
BUN: 12 mg/dL (ref 6–20)
CO2: 27 mmol/L (ref 22–32)
Calcium: 8.8 mg/dL — ABNORMAL LOW (ref 8.9–10.3)
Chloride: 102 mmol/L (ref 98–111)
Creatinine, Ser: 1.01 mg/dL — ABNORMAL HIGH (ref 0.44–1.00)
GFR, Estimated: >60 mL/min (ref >60)
Glucose, Bld: 87 mg/dL (ref 70–99)
Potassium: 3 mmol/L — ABNORMAL LOW (ref 3.5–5.1)
Sodium: 137 mmol/L (ref 135–145)

## 2021-11-30 LAB — CBC
HCT: 37.1 % (ref 36.0–46.0)
Hemoglobin: 11.8 g/dL — ABNORMAL LOW (ref 12.0–15.0)
MCH: 21.2 pg — ABNORMAL LOW (ref 26.0–34.0)
MCHC: 31.8 g/dL (ref 30.0–36.0)
MCV: 66.7 fL — ABNORMAL LOW (ref 80.0–100.0)
Platelets: 293 10*3/uL (ref 150–400)
RBC: 5.56 MIL/uL — ABNORMAL HIGH (ref 3.87–5.11)
RDW: 18.2 % — ABNORMAL HIGH (ref 11.5–15.5)
WBC: 14.8 10*3/uL — ABNORMAL HIGH (ref 4.0–10.5)
nRBC: 0 % (ref 0.0–0.2)

## 2021-11-30 LAB — I-STAT BETA HCG BLOOD, ED (MC, WL, AP ONLY): I-stat hCG, quantitative: <5 m[IU]/mL (ref <5)

## 2021-11-30 LAB — TROPONIN I (HIGH SENSITIVITY): Troponin I (High Sensitivity): <2 ng/L (ref <18)

## 2021-11-30 NOTE — ED Provider Triage Note (Signed)
Emergency Medicine Provider Triage Evaluation Note  Angie Roberts , a 41 y.o. female  was evaluated in triage.  Pt complains of chest pain which feels like deep muscle pain on left chest since this morning. Reports it started after she saw a snake and panicked. Hx of anxiety and panic attacks. States that she has slept and rested since then but pain is still ongoing. Hx HTN, DM. Denies NV, SHOB, radiation.  Review of Systems  Positive: Chest pain Negative: Shob, NV  Physical Exam  BP (!) 150/98   Pulse 91   Temp 98.5 F (36.9 C) (Oral)   Resp 16   LMP 11/16/2021   SpO2 100%  Gen:   Awake, no distress   Resp:  Normal effort  MSK:   Moves extremities without difficulty  Other:  Minimal TTP chest wall  Medical Decision Making  Medically screening exam initiated at 8:19 PM.  Appropriate orders placed.  Angie Roberts was informed that the remainder of the evaluation will be completed by another provider, this initial triage assessment does not replace that evaluation, and the importance of remaining in the ED until their evaluation is complete.  Workup initiated   Angie Roberts, [place] 11/30/21 2020

## 2021-11-30 NOTE — ED Triage Notes (Signed)
Patient c/o L chest pain since this morning when having an anxiety attack after seeing a snake. States pain has continued all day.

## 2021-12-01 LAB — TROPONIN I (HIGH SENSITIVITY): Troponin I (High Sensitivity): 2 ng/L (ref <18)

## 2021-12-01 MED ORDER — KETOROLAC TROMETHAMINE 30 MG/ML IJ SOLN
30.0000 mg | Freq: Once | INTRAMUSCULAR | Status: AC
  Administered 2021-12-01: 30 mg via INTRAVENOUS
  Filled 2021-12-01: qty 1

## 2021-12-01 NOTE — ED Provider Notes (Signed)
[facility]-EMERGENCY DEPT  Provider Note  CSN: [id number] Arrival date & time: 11/30/21 2000  History Chief Complaint  Patient presents with   Chest Pain    Angie Roberts is a 41 y.o. female with history of GAD reports onset of left upper chest pain earlier in the day when she saw a snake and it scared her. Her pain was persistent through the day, not improved with motrin or APAP taken at home. Pain is worse with deep breath. No cough or fever. No leg swelling or recent travel. She has seen cardiology in the past for atypical chest pains. Had a Coronary CT done in Feb 2023 with a score of 0.    Home Medications Prior to Admission medications   Medication Sig Start Date End Date Taking? Authorizing Provider  acetaminophen (TYLENOL) 325 MG tablet Take 2 tablets (650 mg total) by mouth every 6 (six) hours as needed. 12/16/19   Darr, Gerilyn Pilgrim, PA-C  atomoxetine (STRATTERA) 60 MG capsule Take 1 capsule (60 mg total) by mouth daily. 10/10/21   Nwoko, Tommas Olp, PA  buPROPion (WELLBUTRIN XL) 150 MG 24 hr tablet Take 1 tablet (150 mg total) by mouth every morning. 08/11/21 08/11/22  Nwoko, Tommas Olp, PA  clonazePAM (KLONOPIN) 0.5 MG tablet Take 0.5 tablets (0.25 mg total) by mouth daily. 01/17/21 01/17/22  Nwoko, Tommas Olp, PA  dextromethorphan (DELSYM) 30 MG/5ML liquid Take 60 mg by mouth every 12 (twelve) hours as needed for cough. Patient not taking: Reported on 09/16/2021    [provider]  Glycopyrronium Tosylate (QBREXZA) 2.4 % PADS  03/21/21   [provider]  hydrochlorothiazide (HYDRODIURIL) 25 MG tablet Take 25 mg by mouth 2 (two) times daily.    [provider]  hydrOXYzine (ATARAX) 10 MG tablet TAKE 1 TABLET(10 MG) BY MOUTH THREE TIMES DAILY AS NEEDED 10/10/21   Nwoko, Tommas Olp, PA  loratadine (CLARITIN) 10 MG tablet Take 10 mg by mouth daily.    [provider]  sertraline (ZOLOFT) 100 MG tablet Take 2 tablets (200 mg total) by  mouth daily. 10/10/21 10/10/22  Nwoko, Tommas Olp, PA  Vitamin D, Ergocalciferol, (DRISDOL) 1.25 MG (50000 UNIT) CAPS capsule Take 50,000 Units by mouth once a week. 07/15/21   [provider]     Allergies    Lactose, Milk-related compounds, and Penicillins   Review of Systems   Review of Systems Please see HPI for pertinent positives and negatives  Physical Exam BP (!) 146/98   Pulse 67   Temp 98.5 F (36.9 C) (Oral)   Resp 18   LMP 11/16/2021   SpO2 100%   Physical Exam Vitals and nursing note reviewed.  Constitutional:      Appearance: Normal appearance.  HENT:     Head: Normocephalic and atraumatic.     Nose: Nose normal.     Mouth/Throat:     Mouth: Mucous membranes are moist.  Eyes:     Extraocular Movements: Extraocular movements intact.     Conjunctiva/sclera: Conjunctivae normal.  Cardiovascular:     Rate and Rhythm: Normal rate.  Pulmonary:     Effort: Pulmonary effort is normal.     Breath sounds: Normal breath sounds.  Abdominal:     General: Abdomen is flat.     Palpations: Abdomen is soft.     Tenderness: There is no abdominal tenderness.  Musculoskeletal:        General: No swelling. Normal range of motion.  Cervical back: Neck supple.  Skin:    General: Skin is warm and dry.  Neurological:     General: No focal deficit present.     Mental Status: She is alert.  Psychiatric:        Mood and Affect: Mood normal.     ED Results / Procedures / Treatments   EKG EKG Interpretation  Date/Time:  Sunday November 30 2021 20:09:31 EDT Ventricular Rate:  96 PR Interval:  163 QRS Duration: 95 QT Interval:  360 QTC Calculation: 455 R Axis:   39 Text Interpretation: Sinus rhythm Confirmed by Virgina Norfolk ([id number]) on 11/30/2021 9:19:05 PM  Procedures Procedures  Medications Ordered in the ED Medications  ketorolac (TORADOL) 30 MG/ML injection 30 mg (30 mg Intravenous Given 12/01/21 0058)    Initial Impression and Plan  Patient here with  atypical chest pain ongoing all day. Symptoms not concerning for ACS. Labs done in triage showed CBC with mild leukocytosis and mild anemia, similar to previous. BMP with mild hypokalemia. Initial Trop is normal. Repeat is pending. I personally viewed the images from radiology studies and agree with radiologist interpretation: CXR is clear. Plan Toradol for pain pending repeat trop. If neg, anticipate discharge home.   ED Course   Clinical Course as of 12/01/21 0113  Mon Dec 01, 2021  0112 Repeat trop remains normal. Plan discharge with PCP follow up.  [CS]    Clinical Course User Index [CS] Pollyann Savoy, MD     MDM Rules/Calculators/A&P Medical Decision Making Given presenting complaint, I considered that admission might be necessary. After review of results from ED lab and/or imaging studies, admission to the hospital is not indicated at this time.    Problems Addressed: Atypical chest pain: acute illness or injury  Amount and/or Complexity of Data Reviewed Labs: ordered. Decision-making details documented in ED Course. Radiology: ordered and independent interpretation performed. Decision-making details documented in ED Course. ECG/medicine tests: independent interpretation performed. Decision-making details documented in ED Course.  Risk Prescription drug management. Decision regarding hospitalization.    Final Clinical Impression(s) / ED Diagnoses Final diagnoses:  Atypical chest pain    Rx / DC Orders ED Discharge Orders     None        Pollyann Savoy, MD 12/01/21 [phone]

## 2021-12-23 ENCOUNTER — Ambulatory Visit: Payer: Commercial Managed Care - HMO

## 2022-01-02 ENCOUNTER — Ambulatory Visit: Payer: Commercial Managed Care - HMO

## 2022-01-02 ENCOUNTER — Encounter (HOSPITAL_COMMUNITY): Payer: Self-pay | Admitting: Physician Assistant

## 2022-01-02 ENCOUNTER — Telehealth (INDEPENDENT_AMBULATORY_CARE_PROVIDER_SITE_OTHER): Payer: Commercial Managed Care - HMO | Admitting: Physician Assistant

## 2022-01-02 DIAGNOSIS — F411 Generalized anxiety disorder: Secondary | ICD-10-CM | POA: Diagnosis not present

## 2022-01-02 DIAGNOSIS — F4310 Post-traumatic stress disorder, unspecified: Secondary | ICD-10-CM

## 2022-01-02 DIAGNOSIS — F902 Attention-deficit hyperactivity disorder, combined type: Secondary | ICD-10-CM

## 2022-01-02 MED ORDER — ATOMOXETINE HCL 60 MG PO CAPS: 60.0000 mg | ORAL_CAPSULE | Freq: Every day | ORAL | 2 refills | Status: DC

## 2022-01-02 MED ORDER — HYDROXYZINE HCL 10 MG PO TABS: ORAL_TABLET | ORAL | 1 refills | Status: DC

## 2022-01-02 MED ORDER — SERTRALINE HCL 100 MG PO TABS: 200.0000 mg | ORAL_TABLET | Freq: Every day | ORAL | 2 refills | Status: DC

## 2022-01-02 NOTE — Progress Notes (Unsigned)
[place] MD/PA/NP OP Progress Note  Virtual Visit via Video Note  I connected with Angie Roberts on 01/02/22 at  1:30 PM EDT by a video enabled telemedicine application and verified that I am speaking with the correct person using two identifiers.  Location: Patient: Home Provider: Clinic   I discussed the limitations of evaluation and management by telemedicine and the availability of in person appointments. The patient expressed understanding and agreed to proceed.  Follow Up Instructions:   I discussed the assessment and treatment plan with the patient. The patient was provided an opportunity to ask questions and all were answered. The patient agreed with the plan and demonstrated an understanding of the instructions.   The patient was advised to call back or seek an in-person evaluation if the symptoms worsen or if the condition fails to improve as anticipated.  I provided 29 minutes of non-face-to-face time during this encounter.  Malachy Mood, PA   01/02/2022 2:04 PM Angie Roberts  MRN:  [id number]  Chief Complaint:  Chief Complaint  Patient presents with   Follow-up   Medication Management    HPI:   Angie Roberts is a 41 year old female with a past psychiatric history significant for attention deficit hyperactivity disorder (combined type), generalized anxiety disorder, and PTSD who presents to [facility] via virtual video visit for follow-up medication management.  Patient is currently being managed on the following medications:  Atomoxetine 60 mg daily Sertraline 150 mg daily Bupropion (Wellbutrin XL) 150 mg 24-hour tablet daily Hydroxyzine 10 mg 3 times daily as needed  Patient reports that she forgets her medications on occasion during her day off.  She states that the reason why is because she is extremely busy during the day and loses track of time. Patient states that she unable to wake up early to  take her medications and that by the time she wakes up in the morning, she is not thinking about eating, drinking, or sleeping and solely focused on the day ahead. Patient notes that she also has noticed restlessness in her sleep and waking up with migraines for the past month. Patient is unsure of what the restlessness is but she is fearful that she may be having seizures in her sleep. Patient denies having a past history of seizures.  In addition to her side effects experienced, patient has questions about taking the medication atomoxetine and its effects on her kidneys. Despite her issues, patient states that her medications. She has addressed her concerns about seizures with her primary care provider at [facility]; however, her provider did not set up her up with a Neurology referral. In addition to her concerns surrounding her seizures, patient expresses concerns over her prediabetic status.  A GAD-7 screen was performed with the patient scoring a 14  Patient is alert and oriented x4, calm, cooperative, and fully engaged in conversation during the encounter.  Patient reports that she is currently feeling drowsy but admits to recently taking Tylenol.  Patient denies suicidal or homicidal ideations.  She further denies auditory or visual hallucinations and does not appear to be responding to internal/external stimuli.  Patient endorses restless sleep and receives on average 3 to 6 hours of sleep each night.  Patient endorses good appetite and eats on average 2-3 meals per day.  Patient denies alcohol consumption, tobacco use, and illicit drug use.  Visit Diagnosis:    ICD-10-CM   1. Attention deficit hyperactivity disorder (ADHD), combined  type  F90.2 atomoxetine (STRATTERA) 60 MG capsule    sertraline (ZOLOFT) 100 MG tablet    2. GAD (generalized anxiety disorder)  F41.1 sertraline (ZOLOFT) 100 MG tablet    hydrOXYzine (ATARAX) 10 MG tablet    3. PTSD (post-traumatic stress  disorder)  F43.10 sertraline (ZOLOFT) 100 MG tablet       Past Psychiatric History:  PTSD Attention deficit hyperactivity disorder, combined type Generalized anxiety disorder  Past Medical History:  Past Medical History:  Diagnosis Date   Allergy    Anxiety    Depression    Hypertension     Past Surgical History:  Procedure Laterality Date   CHOLECYSTECTOMY      Family Psychiatric History:  Unknown  Family History:  Family History  Problem Relation Age of Onset   Diabetes Mother    Hyperlipidemia Mother    Hypertension Mother    Intellectual disability Mother    Diabetes Father    Hyperlipidemia Father    Hypertension Father    Intellectual disability Father    Diabetes Sister    Intellectual disability Maternal Grandmother    Cancer Maternal Grandfather    Stroke Maternal Grandfather    Diabetes Maternal Grandfather    Intellectual disability Paternal Grandmother    Heart disease Paternal Grandmother    Cancer Paternal Grandmother    Heart disease Paternal Grandfather    Hyperlipidemia Sister     Social History:  Social History   Socioeconomic History   Marital status: Single    Spouse name: Not on file   Number of children: Not on file   Years of education: Not on file   Highest education level: Not on file  Occupational History   Not on file  Tobacco Use   Smoking status: Never   Smokeless tobacco: Never  Vaping Use   Vaping Use: Never used  Substance and Sexual Activity   Alcohol use: Not Currently   Drug use: Never   Sexual activity: Not Currently  Other Topics Concern   Not on file  Social History Narrative   Not on file   Social Determinants of Health   Financial Resource Strain: Low Risk  (11/05/2020)   Overall Financial Resource Strain (CARDIA)    Difficulty of Paying Living Expenses: Not very hard  Food Insecurity: No Food Insecurity (11/05/2020)   Hunger Vital Sign    Worried About Running Out of Food in the Last Year: Never  true    Ran Out of Food in the Last Year: Never true  Transportation Needs: Unmet Transportation Needs (11/05/2020)   PRAPARE - Transportation    Lack of Transportation (Medical): Yes    Lack of Transportation (Non-Medical): Yes  Physical Activity: Inactive (11/05/2020)   Exercise Vital Sign    Days of Exercise per Week: 0 days    Minutes of Exercise per Session: 0 min  Stress: Stress Concern Present (11/05/2020)   [employer] of Occupational Health - Occupational Stress Questionnaire    Feeling of Stress : To some extent  Social Connections: Socially Isolated (11/05/2020)   Social Connection and Isolation Panel [NHANES]    Frequency of Communication with Friends and Family: More than three times a week    Frequency of Social Gatherings with Friends and Family: Twice a week    Attends Religious Services: Never    Database administrator or Organizations: No    Attends Banker Meetings: Never    Marital Status: Never married    Allergies:  Allergies  Allergen Reactions   Lactose Nausea And Vomiting    PT states milk/ dairy products "causes a lot of bloating and gas." PT states milk/ dairy products "causes a lot of bloating and gas."    Milk-Related Compounds Other (See Comments)   Penicillins Other (See Comments)    As a child, unsure of reaction     Metabolic Disorder Labs: Lab Results  Component Value Date   HGBA1C 6.1 02/27/2013   No results found for: "PROLACTIN" Lab Results  Component Value Date   CHOL 182 11/19/2016   TRIG 96 11/19/2016   HDL 48 11/19/2016   CHOLHDL 3.8 11/19/2016   VLDL 20 02/27/2013   LDLCALC 115 (H) 11/19/2016   LDLCALC 134 (H) 02/27/2013   Lab Results  Component Value Date   TSH 2.08 09/16/2021   TSH 2.850 11/19/2016    Therapeutic Level Labs: No results found for: "LITHIUM" No results found for: "VALPROATE" No results found for: "CBMZ"  Current Medications: Current Outpatient Medications  Medication Sig Dispense  Refill   acetaminophen (TYLENOL) 325 MG tablet Take 2 tablets (650 mg total) by mouth every 6 (six) hours as needed. 30 tablet 0   atomoxetine (STRATTERA) 60 MG capsule Take 1 capsule (60 mg total) by mouth daily. 30 capsule 2   buPROPion (WELLBUTRIN XL) 150 MG 24 hr tablet Take 1 tablet (150 mg total) by mouth every morning. 30 tablet 2   clonazePAM (KLONOPIN) 0.5 MG tablet Take 0.5 tablets (0.25 mg total) by mouth daily. 2 tablet 0   dextromethorphan (DELSYM) 30 MG/5ML liquid Take 60 mg by mouth every 12 (twelve) hours as needed for cough. (Patient not taking: Reported on 09/16/2021)     Glycopyrronium Tosylate (QBREXZA) 2.4 % PADS      hydrochlorothiazide (HYDRODIURIL) 25 MG tablet Take 25 mg by mouth 2 (two) times daily.     hydrOXYzine (ATARAX) 10 MG tablet TAKE 1 TABLET(10 MG) BY MOUTH THREE TIMES DAILY AS NEEDED 75 tablet 1   loratadine (CLARITIN) 10 MG tablet Take 10 mg by mouth daily.     sertraline (ZOLOFT) 100 MG tablet Take 2 tablets (200 mg total) by mouth daily. 60 tablet 2   Vitamin D, Ergocalciferol, (DRISDOL) 1.25 MG (50000 UNIT) CAPS capsule Take 50,000 Units by mouth once a week.     No current facility-administered medications for this visit.     Musculoskeletal: Strength & Muscle Tone: Within normal limits Gait & Station: Normal Patient leans: N/A  Psychiatric Specialty Exam: Review of Systems  Psychiatric/Behavioral:  Positive for suicidal ideas. Negative for decreased concentration, dysphoric mood, hallucinations, self-injury and sleep disturbance. The patient is nervous/anxious. The patient is not hyperactive.     There were no vitals taken for this visit.There is no height or weight on file to calculate BMI.  General Appearance: Well-groomed  Eye Contact:  Fair  Speech:  Clear and Coherent and Normal Rate  Volume:  Normal  Mood:  Anxious and Depressed  Affect:  Congruent  Thought Process:  Coherent, Goal Directed, and Descriptions of Associations: Intact   Orientation:  Full (Time, Place, and Person)  Thought Content: WDL and Obsessions   Suicidal Thoughts:  No  Homicidal Thoughts:  No  Memory:  Immediate;   Good Recent;   Fair Remote;   Fair  Judgement:  Good  Insight:  Good  Psychomotor Activity:  Normal  Concentration:  Concentration: Good and Attention Span: Good  Recall:  Good  Fund of Knowledge: Good  Language:  Good  Akathisia:  No  Handed:  Right  AIMS (if indicated): not done  Assets:  Communication Skills Desire for Improvement Resilience Social Support  ADL's:  Intact  Cognition: WNL  Sleep:  Fair   Screenings: GAD-7    Flowsheet Row Video Visit from 01/02/2022 in [facility] Video Visit from 10/10/2021 in [facility] Video Visit from 08/08/2021 in [facility] Video Visit from 06/24/2021 in [facility] Video Visit from 05/08/2021 in [facility]  Total GAD-7 Score 14 12 12 11 5       PHQ2-9    Flowsheet Row Video Visit from 01/02/2022 in [facility] Video Visit from 10/10/2021 in [facility] Video Visit from 08/08/2021 in [facility] Video Visit from 06/24/2021 in [facility] Video Visit from 05/08/2021 in [facility]  PHQ-2 Total Score 1 0 1 4 4   PHQ-9 Total Score -- -- -- 12 10      Flowsheet Row Video Visit from 01/02/2022 in [facility] Video Visit from 10/10/2021 in [facility] Video Visit from 08/08/2021 in [facility]  C-SSRS RISK CATEGORY No Risk No Risk Low Risk        Assessment and Plan:   Angie Roberts is a 41 year old female with a past psychiatric history significant for attention deficit hyperactivity disorder (combined type),  generalized anxiety disorder, and PTSD who presents to [facility] via virtual video visit for follow-up medication management.  Patient presents today stating that she occasionally forgets to take her medications on her day off.  Whenever she does take her medications, patient states that she experiences restless sleep and believes that it is due to one of her medications.  Patient expresses some weariness over taking atomoxetine due to the effects that it may have on her kidneys.  Provider discussed with patient that there is a small likelihood that her atomoxetine would affect her kidneys.  Provider also suggested patient try setting a timer for when she should take her medications.  Patient vocalized understanding.  Patient denies having a past history of seizures.  Provider discussed with patient that Wellbutrin can lower the threshold for seizures.  Provider recommended patient to discontinue taking Wellbutrin to see if her sleep improves.  Patient to taper off the medication in 6 days to determine if her sleep improves.  Patient request for provider to reach out to her primary care provider to discuss options for neurology referral.  Patient to continue taking her other medications as prescribed.  Patient's medications to be e-prescribed to pharmacy of choice.  Collaboration of Care: Collaboration of Care: Medication Management AEB provider managing patient's psychiatric medications, Psychiatrist AEB patient being followed by a mental health provider, and Other provider involved in patient's care AEB patient being followed by a Cardiologist  Patient/Guardian was advised Release of Information must be obtained prior to any record release in order to collaborate their care with an outside provider. Patient/Guardian was advised if they have not already done so to contact the registration department to sign all necessary forms in order for 24 to release information  regarding their care.   Consent: Patient/Guardian gives verbal consent for treatment and assignment of benefits for services provided during this visit. Patient/Guardian expressed understanding and agreed to proceed.   1.  Attention deficit hyperactivity disorder (ADHD), combined type  - atomoxetine (STRATTERA) 60 MG capsule; Take 1 capsule (60 mg total) by mouth daily.  Dispense: 30 capsule; Refill: 2 - sertraline (ZOLOFT) 100 MG tablet; Take 2 tablets (200 mg total) by mouth daily.  Dispense: 60 tablet; Refill: 2  2. GAD (generalized anxiety disorder)  - sertraline (ZOLOFT) 100 MG tablet; Take 2 tablets (200 mg total) by mouth daily.  Dispense: 60 tablet; Refill: 2 - hydrOXYzine (ATARAX) 10 MG tablet; TAKE 1 TABLET(10 MG) BY MOUTH THREE TIMES DAILY AS NEEDED  Dispense: 75 tablet; Refill: 1  3. PTSD (post-traumatic stress disorder)  - sertraline (ZOLOFT) 100 MG tablet; Take 2 tablets (200 mg total) by mouth daily.  Dispense: 60 tablet; Refill: 2  Patient to follow up in 2 months Provider spent a total of 29 minutes with the patient/reviewing patient's chart  Malachy Mood, PA 01/02/2022, 2:04 PM

## 2022-01-13 ENCOUNTER — Ambulatory Visit
Admission: RE | Admit: 2022-01-13 | Discharge: 2022-01-13 | Disposition: A | Discharge status: Home / Self Care | Payer: Commercial Managed Care - HMO | Source: Ambulatory Visit | Attending: Family Medicine | Admitting: Family Medicine

## 2022-01-13 DIAGNOSIS — Z1231 Encounter for screening mammogram for malignant neoplasm of breast: Secondary | ICD-10-CM

## 2022-01-14 ENCOUNTER — Other Ambulatory Visit: Payer: Self-pay | Admitting: Family Medicine

## 2022-01-23 ENCOUNTER — Other Ambulatory Visit: Payer: Self-pay | Admitting: Physician Assistant

## 2022-01-23 DIAGNOSIS — M67431 Ganglion, right wrist: Secondary | ICD-10-CM

## 2022-01-30 ENCOUNTER — Ambulatory Visit
Admission: RE | Admit: 2022-01-30 | Discharge: 2022-01-30 | Disposition: A | Discharge status: Home / Self Care | Payer: Commercial Managed Care - HMO | Source: Ambulatory Visit | Attending: Physician Assistant | Admitting: Physician Assistant

## 2022-01-30 DIAGNOSIS — M67431 Ganglion, right wrist: Secondary | ICD-10-CM

## 2022-02-02 ENCOUNTER — Telehealth (HOSPITAL_COMMUNITY): Payer: Self-pay | Admitting: *Deleted

## 2022-02-02 NOTE — Telephone Encounter (Signed)
Patient LVM stated that she's having a Colonoscopy 9-27/23 & has to stop taking he med's 2 days prior to procedure. Patient noted that she recently stopped a medication about a month ago  & depression is starting to creep back in & wanted to speak with provider P.A.,Eddie

## 2022-02-25 NOTE — Patient Instructions (Signed)
Thank you for attending your appointment today.  -- Continue sertraline and atomoxetine as prescribed. -- Continue to use hydroxyzine as needed for anxiety; try taking 2 tablets at night as needed for insomnia or nighttime awakening. -- START melatonin 3 mg nightly.  -- At your next PCP appointment, ask about further evaluation and testing for sleep apnea.  -- Continue other medications as prescribed.  Please do not make any changes to medications without first discussing with your provider. If you are experiencing a psychiatric emergency, please call 911 or present to your nearest emergency department. Additional crisis, medication management, and therapy resources are included below.  [facility]  [address] [place], [place] [address] [phone] WALK-IN URGENT CARE 24/7 FOR ANYONE [address], [place], [place] Fax: [phone] [email or web address] *Interpreters available *Accepts all insurance and uninsured for Urgent Care needs *Accepts Medicaid and uninsured for outpatient treatment (below)      ONLY FOR [facility]  Below:    Outpatient New Patient Assessment/Therapy Walk-ins:        Monday -Thursday 8am until slots are full.        Every Friday 1pm-4pm  (first come, first served)                   New Patient Psychiatry/Medication Management        Monday-Friday 8am-11am (first come, first served)               For all walk-ins we ask that you arrive by 7:15am, because patients will be seen in the order of arrival.
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
## 2022-02-26 ENCOUNTER — Telehealth (INDEPENDENT_AMBULATORY_CARE_PROVIDER_SITE_OTHER): Payer: Commercial Managed Care - HMO | Admitting: Psychiatry

## 2022-02-26 ENCOUNTER — Encounter (HOSPITAL_COMMUNITY): Payer: Self-pay | Admitting: Psychiatry

## 2022-02-26 DIAGNOSIS — F411 Generalized anxiety disorder: Secondary | ICD-10-CM

## 2022-02-26 DIAGNOSIS — R5383 Other fatigue: Secondary | ICD-10-CM

## 2022-02-26 DIAGNOSIS — F902 Attention-deficit hyperactivity disorder, combined type: Secondary | ICD-10-CM | POA: Diagnosis not present

## 2022-02-26 DIAGNOSIS — G479 Sleep disorder, unspecified: Secondary | ICD-10-CM | POA: Insufficient documentation

## 2022-02-26 DIAGNOSIS — F4310 Post-traumatic stress disorder, unspecified: Secondary | ICD-10-CM

## 2022-02-26 MED ORDER — SERTRALINE HCL 100 MG PO TABS: 200.0000 mg | ORAL_TABLET | Freq: Every day | ORAL | 2 refills | Status: DC

## 2022-02-26 MED ORDER — ATOMOXETINE HCL 60 MG PO CAPS: 60.0000 mg | ORAL_CAPSULE | Freq: Every day | ORAL | 2 refills | Status: DC

## 2022-02-26 MED ORDER — MELATONIN 3 MG PO TABS: 3.0000 mg | ORAL_TABLET | Freq: Every evening | ORAL | 0 refills | Status: DC

## 2022-03-03 ENCOUNTER — Telehealth (HOSPITAL_COMMUNITY): Payer: Commercial Managed Care - HMO | Admitting: Physician Assistant

## 2022-03-11 ENCOUNTER — Other Ambulatory Visit (HOSPITAL_COMMUNITY): Payer: Self-pay | Admitting: Psychiatry

## 2022-03-11 ENCOUNTER — Telehealth (HOSPITAL_COMMUNITY): Payer: Self-pay | Admitting: *Deleted

## 2022-03-11 DIAGNOSIS — F411 Generalized anxiety disorder: Secondary | ICD-10-CM

## 2022-03-11 MED ORDER — HYDROXYZINE HCL 10 MG PO TABS: 10.0000 mg | ORAL_TABLET | Freq: Three times a day (TID) | ORAL | 2 refills | Status: AC | PRN

## 2022-03-11 NOTE — Telephone Encounter (Signed)
Patient had Appt with you on 02/26/22 with a follow up on  04/23/22 Patient called requested refill on -- hydrOXYzine (ATARAX) 10 MG tablet TAKE 1 TABLET(10 MG) BY MOUTH  THREE TIMES DAILY AS NEEDED

## 2022-03-11 NOTE — Progress Notes (Signed)
Sent in requested refill for Atarax 10 mg TID PRN anxiety/sleep to preferred pharmacy.  Alda Berthold, MD 03/11/22

## 2022-03-30 ENCOUNTER — Other Ambulatory Visit (HOSPITAL_COMMUNITY): Payer: Self-pay | Admitting: Physician Assistant

## 2022-03-30 DIAGNOSIS — R051 Acute cough: Secondary | ICD-10-CM | POA: Diagnosis not present

## 2022-03-30 DIAGNOSIS — R430 Anosmia: Secondary | ICD-10-CM | POA: Diagnosis not present

## 2022-03-30 DIAGNOSIS — Z20822 Contact with and (suspected) exposure to covid-19: Secondary | ICD-10-CM | POA: Diagnosis not present

## 2022-03-30 DIAGNOSIS — J069 Acute upper respiratory infection, unspecified: Secondary | ICD-10-CM | POA: Diagnosis not present

## 2022-03-30 DIAGNOSIS — F902 Attention-deficit hyperactivity disorder, combined type: Secondary | ICD-10-CM

## 2022-03-30 NOTE — Telephone Encounter (Signed)
Provider is being seen by Dr. Josephina Shih.  Patient has her next appointment with Dr. Josephina Shih on 04/23/2022.

## 2022-04-03 DIAGNOSIS — E559 Vitamin D deficiency, unspecified: Secondary | ICD-10-CM | POA: Diagnosis not present

## 2022-04-03 DIAGNOSIS — I10 Essential (primary) hypertension: Secondary | ICD-10-CM | POA: Diagnosis not present

## 2022-04-03 DIAGNOSIS — R059 Cough, unspecified: Secondary | ICD-10-CM | POA: Diagnosis not present
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
## 2022-04-23 ENCOUNTER — Telehealth (INDEPENDENT_AMBULATORY_CARE_PROVIDER_SITE_OTHER): Payer: No Typology Code available for payment source | Admitting: Psychiatry

## 2022-04-23 ENCOUNTER — Encounter (HOSPITAL_COMMUNITY): Payer: Self-pay | Admitting: Psychiatry

## 2022-04-23 DIAGNOSIS — F4310 Post-traumatic stress disorder, unspecified: Secondary | ICD-10-CM

## 2022-04-23 DIAGNOSIS — F411 Generalized anxiety disorder: Secondary | ICD-10-CM | POA: Diagnosis not present

## 2022-04-23 DIAGNOSIS — G479 Sleep disorder, unspecified: Secondary | ICD-10-CM

## 2022-04-23 DIAGNOSIS — F902 Attention-deficit hyperactivity disorder, combined type: Secondary | ICD-10-CM | POA: Diagnosis not present

## 2022-04-23 DIAGNOSIS — R5383 Other fatigue: Secondary | ICD-10-CM

## 2022-04-23 DIAGNOSIS — K625 Hemorrhage of anus and rectum: Secondary | ICD-10-CM | POA: Diagnosis not present

## 2022-04-23 MED ORDER — SERTRALINE HCL 100 MG PO TABS: 200.0000 mg | ORAL_TABLET | Freq: Every day | ORAL | 2 refills | Status: DC

## 2022-04-23 MED ORDER — ATOMOXETINE HCL 60 MG PO CAPS: 60.0000 mg | ORAL_CAPSULE | Freq: Every day | ORAL | 2 refills | Status: DC

## 2022-04-23 NOTE — Patient Instructions (Signed)
Thank you for attending your appointment today.  -- We did not make any medication changes today. Please continue medications as prescribed. -- Please discuss with your PCP further workup for sleep apnea. I have also placed a referral for a sleep study.  Please do not make any changes to medications without first discussing with your provider. If you are experiencing a psychiatric emergency, please call 911 or present to your nearest emergency department. Additional crisis, medication management, and therapy resources are included below.  [facility]  [address] [place], [place] [address] [phone] WALK-IN URGENT CARE 24/7 FOR ANYONE [address], [place], [place]  [phone] Fax: [phone] [email or web address] *Interpreters available *Accepts all insurance and uninsured for Urgent Care needs *Accepts Medicaid and uninsured for outpatient treatment (below)      ONLY FOR [facility]  Below:    Outpatient New Patient Assessment/Therapy Walk-ins:        Monday -Thursday 8am until slots are full.        Every Friday 1pm-4pm  (first come, first served)                   New Patient Psychiatry/Medication Management        Monday-Friday 8am-11am (first come, first served)               For all walk-ins we ask that you arrive by 7:15am, because patients will be seen in the order of arrival.

## 2022-05-06 DIAGNOSIS — J069 Acute upper respiratory infection, unspecified: Secondary | ICD-10-CM | POA: Diagnosis not present

## 2022-05-06 DIAGNOSIS — I10 Essential (primary) hypertension: Secondary | ICD-10-CM | POA: Diagnosis not present

## 2022-05-27 DIAGNOSIS — K219 Gastro-esophageal reflux disease without esophagitis: Secondary | ICD-10-CM | POA: Diagnosis not present

## 2022-05-27 DIAGNOSIS — K3189 Other diseases of stomach and duodenum: Secondary | ICD-10-CM | POA: Diagnosis not present

## 2022-05-27 DIAGNOSIS — N920 Excessive and frequent menstruation with regular cycle: Secondary | ICD-10-CM | POA: Diagnosis not present

## 2022-05-27 DIAGNOSIS — K5900 Constipation, unspecified: Secondary | ICD-10-CM | POA: Diagnosis not present

## 2022-05-27 DIAGNOSIS — E538 Deficiency of other specified B group vitamins: Secondary | ICD-10-CM | POA: Diagnosis not present

## 2022-05-27 DIAGNOSIS — D509 Iron deficiency anemia, unspecified: Secondary | ICD-10-CM | POA: Diagnosis not present

## 2022-05-28 DIAGNOSIS — H5213 Myopia, bilateral: Secondary | ICD-10-CM | POA: Diagnosis not present

## 2022-06-03 DIAGNOSIS — R350 Frequency of micturition: Secondary | ICD-10-CM | POA: Diagnosis not present

## 2022-06-03 DIAGNOSIS — R102 Pelvic and perineal pain: Secondary | ICD-10-CM | POA: Diagnosis not present

## 2022-06-15 DIAGNOSIS — E538 Deficiency of other specified B group vitamins: Secondary | ICD-10-CM | POA: Diagnosis not present

## 2022-06-15 DIAGNOSIS — R69 Illness, unspecified: Secondary | ICD-10-CM | POA: Diagnosis not present

## 2022-06-15 DIAGNOSIS — E559 Vitamin D deficiency, unspecified: Secondary | ICD-10-CM | POA: Diagnosis not present

## 2022-06-15 DIAGNOSIS — D509 Iron deficiency anemia, unspecified: Secondary | ICD-10-CM | POA: Diagnosis not present

## 2022-06-15 DIAGNOSIS — R400 Somnolence: Secondary | ICD-10-CM | POA: Diagnosis not present

## 2022-06-30 ENCOUNTER — Telehealth (HOSPITAL_COMMUNITY): Payer: Commercial Managed Care - HMO | Admitting: Psychiatry

## 2022-07-07 ENCOUNTER — Telehealth (HOSPITAL_COMMUNITY): Payer: Self-pay | Admitting: Psychiatry

## 2022-07-07 NOTE — Telephone Encounter (Signed)
Pt LVM for [place] to call her back no further info... I returned patient call today @ 2:48 p.m. no answer...Marland KitchenLVM for patient to call us back. - AL

## 2022-07-14 DIAGNOSIS — N898 Other specified noninflammatory disorders of vagina: Secondary | ICD-10-CM | POA: Diagnosis not present

## 2022-07-14 DIAGNOSIS — F321 Major depressive disorder, single episode, moderate: Secondary | ICD-10-CM | POA: Diagnosis not present

## 2022-07-14 DIAGNOSIS — R350 Frequency of micturition: Secondary | ICD-10-CM | POA: Diagnosis not present

## 2022-07-15 ENCOUNTER — Encounter (HOSPITAL_COMMUNITY): Payer: Self-pay

## 2022-07-16 ENCOUNTER — Telehealth (HOSPITAL_COMMUNITY): Payer: Self-pay | Admitting: Psychiatry

## 2022-07-17 ENCOUNTER — Telehealth (INDEPENDENT_AMBULATORY_CARE_PROVIDER_SITE_OTHER): Payer: No Typology Code available for payment source | Admitting: Physician Assistant

## 2022-07-17 ENCOUNTER — Other Ambulatory Visit: Payer: Self-pay | Admitting: Family Medicine

## 2022-07-17 DIAGNOSIS — F902 Attention-deficit hyperactivity disorder, combined type: Secondary | ICD-10-CM

## 2022-07-17 DIAGNOSIS — R350 Frequency of micturition: Secondary | ICD-10-CM

## 2022-07-17 DIAGNOSIS — F4310 Post-traumatic stress disorder, unspecified: Secondary | ICD-10-CM

## 2022-07-17 DIAGNOSIS — F411 Generalized anxiety disorder: Secondary | ICD-10-CM

## 2022-07-17 DIAGNOSIS — R102 Pelvic and perineal pain: Secondary | ICD-10-CM

## 2022-07-17 DIAGNOSIS — G479 Sleep disorder, unspecified: Secondary | ICD-10-CM

## 2022-07-20 ENCOUNTER — Encounter (HOSPITAL_COMMUNITY): Payer: Self-pay | Admitting: Physician Assistant

## 2022-07-20 MED ORDER — HYDROXYZINE HCL 10 MG PO TABS: 10.0000 mg | ORAL_TABLET | Freq: Three times a day (TID) | ORAL | 1 refills | Status: DC | PRN

## 2022-07-20 MED ORDER — SERTRALINE HCL 100 MG PO TABS: 200.0000 mg | ORAL_TABLET | Freq: Every day | ORAL | 2 refills | Status: DC

## 2022-07-20 MED ORDER — MELATONIN 3 MG PO TABS: 3.0000 mg | ORAL_TABLET | Freq: Every evening | ORAL | 1 refills | Status: DC

## 2022-07-20 MED ORDER — ATOMOXETINE HCL 60 MG PO CAPS: 60.0000 mg | ORAL_CAPSULE | Freq: Every day | ORAL | 2 refills | Status: DC
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
## 2022-07-23 ENCOUNTER — Ambulatory Visit (HOSPITAL_COMMUNITY): Payer: No Typology Code available for payment source | Admitting: Licensed Clinical Social Worker

## 2022-07-23 DIAGNOSIS — F411 Generalized anxiety disorder: Secondary | ICD-10-CM

## 2022-07-23 DIAGNOSIS — F4310 Post-traumatic stress disorder, unspecified: Secondary | ICD-10-CM

## 2022-07-23 DIAGNOSIS — I10 Essential (primary) hypertension: Secondary | ICD-10-CM | POA: Diagnosis not present

## 2022-07-23 DIAGNOSIS — G4719 Other hypersomnia: Secondary | ICD-10-CM | POA: Diagnosis not present
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
## 2022-08-10 DIAGNOSIS — R7303 Prediabetes: Secondary | ICD-10-CM | POA: Diagnosis not present

## 2022-08-10 DIAGNOSIS — I10 Essential (primary) hypertension: Secondary | ICD-10-CM | POA: Diagnosis not present

## 2022-08-10 DIAGNOSIS — E6609 Other obesity due to excess calories: Secondary | ICD-10-CM | POA: Diagnosis not present

## 2022-08-13 ENCOUNTER — Ambulatory Visit
Admission: RE | Admit: 2022-08-13 | Discharge: 2022-08-13 | Disposition: A | Discharge status: Home / Self Care | Payer: No Typology Code available for payment source | Source: Ambulatory Visit | Attending: Family Medicine | Admitting: Family Medicine

## 2022-08-13 DIAGNOSIS — R350 Frequency of micturition: Secondary | ICD-10-CM

## 2022-08-13 DIAGNOSIS — D251 Intramural leiomyoma of uterus: Secondary | ICD-10-CM | POA: Diagnosis not present

## 2022-08-13 DIAGNOSIS — R102 Pelvic and perineal pain: Secondary | ICD-10-CM

## 2022-08-20 ENCOUNTER — Ambulatory Visit (INDEPENDENT_AMBULATORY_CARE_PROVIDER_SITE_OTHER): Payer: No Typology Code available for payment source | Admitting: Licensed Clinical Social Worker

## 2022-08-20 DIAGNOSIS — F411 Generalized anxiety disorder: Secondary | ICD-10-CM

## 2022-08-20 DIAGNOSIS — F4310 Post-traumatic stress disorder, unspecified: Secondary | ICD-10-CM

## 2022-08-20 NOTE — Progress Notes (Signed)
   THERAPIST PROGRESS NOTE  Virtual Visit via Video Note  I connected with [place] on 08/20/22 at 11:00 AM EDT by a video enabled telemedicine application and verified that I am speaking with the correct person using two identifiers.  Location: Patient: Angie Roberts  Provider: Providers Home    I discussed the limitations of evaluation and management by telemedicine and the availability of in person appointments. The patient expressed understanding and agreed to proceed.      I discussed the assessment and treatment plan with the patient. The patient was provided an opportunity to ask questions and all were answered. The patient agreed with the plan and demonstrated an understanding of the instructions.   The patient was advised to call back or seek an in-person evaluation if the symptoms worsen or if the condition fails to improve as anticipated.  I provided 45 minutes of non-face-to-face time during this encounter.   Dory Horn, LCSW   Participation Level: Active  Behavioral Response: CasualAlertAnxious  Type of Therapy: Individual Therapy  Treatment Goals addressed: treatment plan updated to reflect goals discussed   ProgressTowards Goals: Initial  Interventions: CBT, Motivational Interviewing, and Supportive   Suicidal/Homicidal: Nowithout intent/plan  Therapist Response:   Pt was alert and oriented x 5. She was dressed casually and engaged well in therapy session. She presented with depressed and anxious mood/affect. Vallory was pleasant, cooperative, and maintained good eye contact.  Pt reports primary stressor are finding herself again. Pt reports that after 20 years of looking for someone to be with she is starting to focus on herself and getting back to her roots. Providencia reports that she has settled in the past for things that she should not have. She has started to increase coping skills for attempting to DJ music again or color. Pt  also reports she has a very supportive job that she has not had in the past. She wants to find herself and who she is before looking into another relationship.  Intervention/Plan: LCSW used CBT to discuss cognitive distortion and how they are common parts of daily life but too many can hurt our mental health. LCSW used reframing. LCSW used psychoanalytic therapy for pt to express thoughts, feeling and emotions. LCSW used supportive therapy for praise and encouragement.      Plan: Return again in 4 weeks.  Diagnosis: PTSD (post-traumatic stress disorder)  GAD (generalized anxiety disorder)  Collaboration of Care: Other None today   Patient/Guardian was advised Release of Information must be obtained prior to any record release in order to collaborate their care with an outside provider. Patient/Guardian was advised if they have not already done so to contact the registration department to sign all necessary forms in order for [place] to release information regarding their care.   Consent: Patient/Guardian gives verbal consent for treatment and assignment of benefits for services provided during this visit. Patient/Guardian expressed understanding and agreed to proceed.   Dory Horn, LCSW 08/20/2022

## 2022-08-27 ENCOUNTER — Telehealth (HOSPITAL_COMMUNITY): Payer: No Typology Code available for payment source | Admitting: Physician Assistant

## 2022-09-01 ENCOUNTER — Telehealth (HOSPITAL_COMMUNITY): Payer: No Typology Code available for payment source | Admitting: Physician Assistant

## 2022-09-01 ENCOUNTER — Encounter (HOSPITAL_COMMUNITY): Payer: Self-pay

## 2022-09-03 DIAGNOSIS — K3189 Other diseases of stomach and duodenum: Secondary | ICD-10-CM | POA: Diagnosis not present

## 2022-09-10 DIAGNOSIS — I10 Essential (primary) hypertension: Secondary | ICD-10-CM | POA: Diagnosis not present

## 2022-09-10 DIAGNOSIS — F411 Generalized anxiety disorder: Secondary | ICD-10-CM | POA: Diagnosis not present

## 2022-09-10 DIAGNOSIS — G4733 Obstructive sleep apnea (adult) (pediatric): Secondary | ICD-10-CM | POA: Diagnosis not present

## 2022-09-24 ENCOUNTER — Telehealth (HOSPITAL_COMMUNITY): Payer: Self-pay | Admitting: Physician Assistant

## 2022-09-24 ENCOUNTER — Other Ambulatory Visit: Payer: Self-pay | Admitting: Obstetrics and Gynecology

## 2022-09-24 ENCOUNTER — Other Ambulatory Visit (HOSPITAL_COMMUNITY)
Admission: RE | Admit: 2022-09-24 | Discharge: 2022-09-24 | Disposition: A | Discharge status: Home / Self Care | Payer: No Typology Code available for payment source | Source: Ambulatory Visit | Attending: Obstetrics and Gynecology | Admitting: Obstetrics and Gynecology

## 2022-09-24 DIAGNOSIS — Z01419 Encounter for gynecological examination (general) (routine) without abnormal findings: Secondary | ICD-10-CM | POA: Diagnosis not present

## 2022-09-25 ENCOUNTER — Ambulatory Visit (HOSPITAL_COMMUNITY): Payer: No Typology Code available for payment source | Admitting: Licensed Clinical Social Worker

## 2022-09-28 LAB — CYTOLOGY - PAP
Comment: NEGATIVE
Diagnosis: NEGATIVE
Diagnosis: REACTIVE
High risk HPV: NEGATIVE

## 2022-10-28 ENCOUNTER — Ambulatory Visit (HOSPITAL_COMMUNITY): Payer: No Typology Code available for payment source | Admitting: Licensed Clinical Social Worker

## 2022-10-28 DIAGNOSIS — F411 Generalized anxiety disorder: Secondary | ICD-10-CM

## 2022-10-28 DIAGNOSIS — F902 Attention-deficit hyperactivity disorder, combined type: Secondary | ICD-10-CM

## 2022-10-28 NOTE — Progress Notes (Signed)
THERAPIST PROGRESS NOTE  Virtual Visit via Video Note  I connected with Angie Roberts on 10/28/22 at 10:00 AM EDT by a video enabled telemedicine application and verified that I am speaking with the correct person using two identifiers.  Location: Patient: [facility]  Provider: Providers Home    I discussed the limitations of evaluation and management by telemedicine and the availability of in person appointments. The patient expressed understanding and agreed to proceed.      I discussed the assessment and treatment plan with the patient. The patient was provided an opportunity to ask questions and all were answered. The patient agreed with the plan and demonstrated an understanding of the instructions.   The patient was advised to call back or seek an in-person evaluation if the symptoms worsen or if the condition fails to improve as anticipated.  I provided 30 minutes of non-face-to-face time during this encounter.   Angie Cooks, LCSW   Participation Level: Active  Behavioral Response: CasualAlertAnxious and Depressed  Type of Therapy: Individual Therapy  Treatment Goals addressed:  Active     Anxiety     LTG: Angie Roberts will score less than 5 on the Generalized Anxiety Disorder 7 Scale (GAD-7)      Start:  08/20/22    Expected End:  02/15/23         STG: Angie Roberts will participate in at least 80% of scheduled individual psychotherapy sessions  (Progressing)     Start:  08/20/22    Expected End:  02/15/23         STG: Angie Roberts will complete at least 80% of assigned homework  (Progressing)     Start:  08/20/22    Expected End:  02/15/23         Identify 3 triggers for anxiety  (Progressing)     Start:  08/20/22    Expected End:  02/15/23       Goal Note     Work  Financials          Identify three coping skills for anxiety      Start:  08/20/22    Expected End:  02/15/23       Goal Note     Partialization            OP  Depression     LTG: Angie Roberts will score less than 10 on the Patient Health Questionnaire (PHQ-9)      Start:  08/20/22    Expected End:  02/15/23         identify 3 triggers for depression      Start:  08/20/22    Expected End:  02/15/23       Goal Note     ADHD          identify three coping skills for depression      Start:  08/20/22    Expected End:  02/15/23            ProgressTowards Goals: Progressing  Interventions: CBT, Motivational Interviewing, and Supportive  Summary: Angie Roberts is a 42 y.o. female who presents with depressed and anxious mood\affect.  Patient was pleasant, cooperative, maintained good eye contact.  She engaged well in therapy session was dressed casually.  Angie Roberts was alert and oriented x 5.  Patient comes in today with primary stressors as financials, transportation, and work.  Patient reports primary stressor as work.  Patient reports that she is not getting tasks done fast enough according to  her supervisors.  Patient reports that she addresses things with quality over quantity.  Patient states that she has not been getting all her test done at work and has discussed the issues with her boss.  Patient reports that this has resulted in different hours and asking her to part-time which patient declined company's request.  Patient reports that she is doing the job to her best of her abilities and does not want her insurance to be altered because of part-time status.  Patient reports due to issues at work she has been looking at alternative employment and also enrolled at [employer] for [employer].  Other stressors for patient are transportation as she does not have a vehicle and only has resources for "access".  Patient also reports getting behind financials for standard bills such as utilities in the household.  Suicidal/Homicidal: Nowithout intent/plan  Therapist Response:    Interventions\plan:  LCSW supportive therapy for praise and encouragement.  LCSW educated patient on taking medications in order to decrease depression, anxiety, and ADHD symptoms.  LCSW spoke with patient about partialization to help decrease depression and anxiety.  LCSW spoke with patient about balancing values with work requirements.  LCSW used person centered therapy for advocacy and empowerment.  Plan for patient is to start at [employer], apply for alternative employment, and take medication 7 out of 7 days/week.    Diagnosis: GAD (generalized anxiety disorder)  Attention deficit hyperactivity disorder (ADHD), combined type  Collaboration of Care: Other None today   Patient/Guardian was advised Release of Information must be obtained prior to any record release in order to collaborate their care with an outside provider. Patient/Guardian was advised if they have not already done so to contact the registration department to sign all necessary forms in order for [place] to release information regarding their care.   Consent: Patient/Guardian gives verbal consent for treatment and assignment of benefits for services provided during this visit. Patient/Guardian expressed understanding and agreed to proceed.   Angie Cooks, LCSW 10/28/2022

## 2022-11-03 ENCOUNTER — Encounter (HOSPITAL_COMMUNITY): Payer: Self-pay | Admitting: Physician Assistant

## 2022-11-03 ENCOUNTER — Telehealth (HOSPITAL_COMMUNITY): Payer: No Typology Code available for payment source | Admitting: Physician Assistant

## 2022-11-03 DIAGNOSIS — G479 Sleep disorder, unspecified: Secondary | ICD-10-CM | POA: Diagnosis not present

## 2022-11-03 DIAGNOSIS — F411 Generalized anxiety disorder: Secondary | ICD-10-CM

## 2022-11-03 DIAGNOSIS — F331 Major depressive disorder, recurrent, moderate: Secondary | ICD-10-CM | POA: Diagnosis not present

## 2022-11-03 DIAGNOSIS — F4310 Post-traumatic stress disorder, unspecified: Secondary | ICD-10-CM

## 2022-11-03 DIAGNOSIS — F902 Attention-deficit hyperactivity disorder, combined type: Secondary | ICD-10-CM

## 2022-11-03 MED ORDER — SERTRALINE HCL 100 MG PO TABS: 200.0000 mg | ORAL_TABLET | Freq: Every day | ORAL | 2 refills | Status: DC

## 2022-11-03 MED ORDER — MELATONIN 3 MG PO TABS: 3.0000 mg | ORAL_TABLET | Freq: Every evening | ORAL | 1 refills | Status: AC

## 2022-11-03 MED ORDER — BUSPIRONE HCL 7.5 MG PO TABS: 7.5000 mg | ORAL_TABLET | Freq: Two times a day (BID) | ORAL | 1 refills | Status: DC

## 2022-11-03 MED ORDER — HYDROXYZINE HCL 10 MG PO TABS: 10.0000 mg | ORAL_TABLET | Freq: Three times a day (TID) | ORAL | 1 refills | Status: DC | PRN

## 2022-11-03 MED ORDER — ATOMOXETINE HCL 60 MG PO CAPS: 60.0000 mg | ORAL_CAPSULE | Freq: Every day | ORAL | 2 refills | Status: DC
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
## 2022-11-03 NOTE — Telephone Encounter (Signed)
Message acknowledged and reviewed.

## 2022-11-06 ENCOUNTER — Other Ambulatory Visit: Payer: Self-pay | Admitting: Obstetrics and Gynecology

## 2022-11-06 DIAGNOSIS — R928 Other abnormal and inconclusive findings on diagnostic imaging of breast: Secondary | ICD-10-CM

## 2022-11-27 DIAGNOSIS — J069 Acute upper respiratory infection, unspecified: Secondary | ICD-10-CM | POA: Diagnosis not present

## 2022-11-27 DIAGNOSIS — R5383 Other fatigue: Secondary | ICD-10-CM | POA: Diagnosis not present

## 2022-12-10 ENCOUNTER — Ambulatory Visit (HOSPITAL_COMMUNITY): Payer: No Typology Code available for payment source | Admitting: Licensed Clinical Social Worker

## 2022-12-10 DIAGNOSIS — F411 Generalized anxiety disorder: Secondary | ICD-10-CM

## 2022-12-10 DIAGNOSIS — F4310 Post-traumatic stress disorder, unspecified: Secondary | ICD-10-CM

## 2022-12-10 DIAGNOSIS — F331 Major depressive disorder, recurrent, moderate: Secondary | ICD-10-CM

## 2022-12-10 NOTE — Progress Notes (Signed)
THERAPIST PROGRESS NOTE  Virtual Visit via Video Note  I connected with Rakhi Talla Mansfield on 12/10/22 at  9:00 AM EDT by a video enabled telemedicine application and verified that I am speaking with the correct person using two identifiers.  Location: Patient: [facility]  Provider: Providers Home    I discussed the limitations of evaluation and management by telemedicine and the availability of in person appointments. The patient expressed understanding and agreed to proceed.     I discussed the assessment and treatment plan with the patient. The patient was provided an opportunity to ask questions and all were answered. The patient agreed with the plan and demonstrated an understanding of the instructions.   The patient was advised to call back or seek an in-person evaluation if the symptoms worsen or if the condition fails to improve as anticipated.  I provided 45 minutes of non-face-to-face time during this encounter.   Weber Cooks, LCSW   Participation Level: Active  Behavioral Response: CasualAlertAnxious and Depressed  Type of Therapy: Individual Therapy  Treatment Goals addressed:  Active     Anxiety     LTG: Loanne will score less than 5 on the Generalized Anxiety Disorder 7 Scale (GAD-7)      Start:  08/20/22    Expected End:  02/15/23         STG: Latrease will participate in at least 80% of scheduled individual psychotherapy sessions  (Progressing)     Start:  08/20/22    Expected End:  02/15/23         STG: Quadasia will complete at least 80% of assigned homework  (Progressing)     Start:  08/20/22    Expected End:  02/15/23         Identify 3 triggers for anxiety  (Progressing)     Start:  08/20/22    Expected End:  02/15/23       Goal Note     Work  Financials          Identify three coping skills for anxiety      Start:  08/20/22    Expected End:  02/15/23       Goal Note     Partialization          Discuss  risks and benefits of medication treatment options for this problem and prescribe as indicated     Start:  08/20/22         Encourage Nakita to take psychotropic medication(s) as prescribed     Start:  08/20/22         Review results of GAD-7 with Winter to track progress     Start:  08/20/22         Work with Elonda Husky to track symptoms, triggers, and/or skill use through a mood chart, diary card, or journal     Start:  08/20/22         Perform psychoeducation regarding anxiety disorders     Start:  08/20/22         Provide Aliyah with educational information and reading material on anxiety, its causes, and symptoms.      Start:  08/20/22           OP Depression     LTG: Nalea will score less than 10 on the Patient Health Questionnaire (PHQ-9)      Start:  08/20/22    Expected End:  02/15/23         identify 3  triggers for depression  (Progressing)     Start:  08/20/22    Expected End:  02/15/23       Goal Note     Work          identify three coping skills for depression      Start:  08/20/22    Expected End:  02/15/23            ProgressTowards Goals: Progressing  Interventions: CBT, Motivational Interviewing, and Supportive   Suicidal/Homicidal: Nowithout intent/plan  Therapist Response:    Che[place] was alert and oriented x 5. She was pleasant, cooperative, and maintained good eye contact. She presented with depressed and anxious mood/affect. She engaged well in therapy session and was dressed casually.   Pt reports primary stressor as school, financials, and work. Cuca reports that she has been now switched to 2nd shift at work. She endorses symptoms for fatigue and overspending in today's session. She reports that she should be on a sleep apnea machine but cannot afford the out-of-pocket cost for the machine. Pt also reports work conflict with someone in her division who wants to talk and educate pt on her financial situation.    Interventions/Plan: LCSW used supportive therapy for praise and encouragement. LCSW spoke with pt about deep breathing to help decrease tenson and worry for anxiety. LCSW spoke with pt about prioritization to help manage anxiety. Pt asked about PHP and if she could do an assessment for intensive therapy and LCSW was agreeable to referral as pt does report decrease level of functioning with being overwhelmed, forgetfulness, and fatigue.   Plan: Return again in 3 weeks.  Diagnosis: PTSD (post-traumatic stress disorder)  GAD (generalized anxiety disorder)  Moderate episode of recurrent major depressive disorder (HCC)  Collaboration of Care: Other None today   Patient/Guardian was advised Release of Information must be obtained prior to any record release in order to collaborate their care with an outside provider. Patient/Guardian was advised if they have not already done so to contact the registration department to sign all necessary forms in order for [place] to release information regarding their care.   Consent: Patient/Guardian gives verbal consent for treatment and assignment of benefits for services provided during this visit. Patient/Guardian expressed understanding and agreed to proceed.   Weber Cooks, LCSW 12/10/2022

## 2022-12-14 ENCOUNTER — Telehealth (HOSPITAL_COMMUNITY): Payer: Self-pay | Admitting: Professional

## 2023-01-01 ENCOUNTER — Ambulatory Visit (HOSPITAL_COMMUNITY): Payer: No Typology Code available for payment source | Admitting: Licensed Clinical Social Worker

## 2023-01-04 ENCOUNTER — Telehealth (HOSPITAL_COMMUNITY): Payer: Self-pay | Admitting: Professional

## 2023-01-05 ENCOUNTER — Ambulatory Visit (INDEPENDENT_AMBULATORY_CARE_PROVIDER_SITE_OTHER): Payer: No Typology Code available for payment source | Admitting: Licensed Clinical Social Worker

## 2023-01-05 DIAGNOSIS — F411 Generalized anxiety disorder: Secondary | ICD-10-CM | POA: Diagnosis not present

## 2023-01-05 DIAGNOSIS — F4310 Post-traumatic stress disorder, unspecified: Secondary | ICD-10-CM
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
## 2023-01-21 ENCOUNTER — Encounter (HOSPITAL_COMMUNITY): Payer: Self-pay

## 2023-01-21 ENCOUNTER — Ambulatory Visit (HOSPITAL_COMMUNITY): Payer: No Typology Code available for payment source

## 2023-01-21 ENCOUNTER — Telehealth (HOSPITAL_COMMUNITY): Payer: Self-pay | Admitting: Licensed Clinical Social Worker

## 2023-01-21 NOTE — Telephone Encounter (Signed)
Cln called again to reschedule CCA, as cln was informed pt had called [facility] to reschedule instead of the phone numbers cln had left on her vm. Pt confirmed availability for Monday at 10 am. Pt asked if she will start the program next week and cln explained that she should be able to if she meets criteria for the program and stated that the purpose of the assessment is to determine whether she meets criteria. Pt responded, "I already took off for my job." Cln informed pt that PHP counselors complete FMLA/STD paperwork for pts admitted to [facility]. Cln confirmed pt's email address: [email or web address] and instructed pt to download the Teams app prior to the appt. Pt responded, "You never did email me no app." Cln explained that we do not email an app, but an invite to the assessment and did not email her an invite because her appointment had to be rescheduled due to staffing issues. Cln instructed pt to download the [employer] Teams app from her phone's app store and informed her she will get an email invitation to join on Monday. Pt verbalized understanding. Pt confirmed she has [employer]. She denied SI and contracted for safety.

## 2023-01-25 ENCOUNTER — Telehealth (HOSPITAL_COMMUNITY): Payer: Self-pay | Admitting: Psychiatry

## 2023-01-25 ENCOUNTER — Other Ambulatory Visit (HOSPITAL_COMMUNITY): Payer: No Typology Code available for payment source | Admitting: Professional

## 2023-01-25 DIAGNOSIS — I10 Essential (primary) hypertension: Secondary | ICD-10-CM | POA: Diagnosis not present

## 2023-01-25 DIAGNOSIS — Z6837 Body mass index (BMI) 37.0-37.9, adult: Secondary | ICD-10-CM | POA: Diagnosis not present

## 2023-01-25 DIAGNOSIS — N343 Urethral syndrome, unspecified: Secondary | ICD-10-CM | POA: Diagnosis not present

## 2023-01-25 DIAGNOSIS — N3000 Acute cystitis without hematuria: Secondary | ICD-10-CM | POA: Diagnosis not present
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
## 2023-02-01 ENCOUNTER — Telehealth (HOSPITAL_COMMUNITY): Payer: Self-pay | Admitting: Psychiatry

## 2023-02-01 NOTE — Telephone Encounter (Signed)
D:  Placed second call to pt, since case mgr was informed that pt is stating she never received a call re: virtual MH-IOP.  Case mgr had previously called pt on 01-25-23 @1503 , but there was no answer and she left a vm requesting pt to call the case mgr back.  According to pt, she doesn't have vm and she never got a call.  A:  Oriented pt.  Pt states she can't attend virtual MH-IOP because of work.  "I am only off today and Friday this week; then my schedule returns to the regular schedule next week." Encouraged pt to contact [facility]; but according to pt, she has attended those groups before.  Recommended pt f/u with her providers at [facility].  Pt denies SI/HI or A/V hallucinations.  R:  Pt receptive.

## 2023-02-03 DIAGNOSIS — F4329 Adjustment disorder with other symptoms: Secondary | ICD-10-CM | POA: Diagnosis not present

## 2023-02-10 DIAGNOSIS — E6609 Other obesity due to excess calories: Secondary | ICD-10-CM | POA: Diagnosis not present

## 2023-02-10 DIAGNOSIS — R7303 Prediabetes: Secondary | ICD-10-CM | POA: Diagnosis not present

## 2023-02-10 DIAGNOSIS — L810 Postinflammatory hyperpigmentation: Secondary | ICD-10-CM | POA: Diagnosis not present

## 2023-02-10 DIAGNOSIS — Z6836 Body mass index (BMI) 36.0-36.9, adult: Secondary | ICD-10-CM | POA: Diagnosis not present

## 2023-02-10 DIAGNOSIS — I10 Essential (primary) hypertension: Secondary | ICD-10-CM | POA: Diagnosis not present

## 2023-02-11 DIAGNOSIS — F4329 Adjustment disorder with other symptoms: Secondary | ICD-10-CM | POA: Diagnosis not present

## 2023-04-30 ENCOUNTER — Other Ambulatory Visit (HOSPITAL_COMMUNITY): Payer: Self-pay | Admitting: Physician Assistant

## 2023-04-30 DIAGNOSIS — F902 Attention-deficit hyperactivity disorder, combined type: Secondary | ICD-10-CM

## 2023-05-04 ENCOUNTER — Telehealth (HOSPITAL_COMMUNITY): Payer: Self-pay | Admitting: Physician Assistant

## 2023-05-05 ENCOUNTER — Other Ambulatory Visit (HOSPITAL_COMMUNITY): Payer: Self-pay | Admitting: Physician Assistant

## 2023-05-05 DIAGNOSIS — F902 Attention-deficit hyperactivity disorder, combined type: Secondary | ICD-10-CM

## 2023-05-05 MED ORDER — ATOMOXETINE HCL 60 MG PO CAPS: 60.0000 mg | ORAL_CAPSULE | Freq: Every day | ORAL | 0 refills | Status: DC

## 2023-05-05 NOTE — Telephone Encounter (Signed)
Message acknowledged and reviewed.

## 2023-05-05 NOTE — Progress Notes (Signed)
Provider was contacted by Darolyn Rua regarding patient's request for a bridge and her use of Strattera.  Patient next appointment scheduled for 06/03/2022.  Provider to provide a 30-day supply of Strattera for the patient.  Patient's medication to be e-prescribed to pharmacy of choice.

## 2023-06-04 ENCOUNTER — Telehealth (INDEPENDENT_AMBULATORY_CARE_PROVIDER_SITE_OTHER): Payer: No Payment, Other | Admitting: Physician Assistant

## 2023-06-04 DIAGNOSIS — F331 Major depressive disorder, recurrent, moderate: Secondary | ICD-10-CM

## 2023-06-04 DIAGNOSIS — F902 Attention-deficit hyperactivity disorder, combined type: Secondary | ICD-10-CM

## 2023-06-04 DIAGNOSIS — F4310 Post-traumatic stress disorder, unspecified: Secondary | ICD-10-CM

## 2023-06-04 DIAGNOSIS — F411 Generalized anxiety disorder: Secondary | ICD-10-CM

## 2023-06-05 ENCOUNTER — Encounter (HOSPITAL_COMMUNITY): Payer: Self-pay | Admitting: Physician Assistant

## 2023-06-05 MED ORDER — ATOMOXETINE HCL 60 MG PO CAPS: 60.0000 mg | ORAL_CAPSULE | Freq: Every day | ORAL | 1 refills | Status: DC

## 2023-06-05 MED ORDER — SERTRALINE HCL 100 MG PO TABS
200.0000 mg | ORAL_TABLET | Freq: Every day | ORAL | 1 refills | Status: DC
  Filled 2023-06-05 – 2023-08-03 (×2): qty 60, 30d supply, fill #0
  Filled 2023-09-10: qty 60, 30d supply, fill #1

## 2023-06-05 MED ORDER — BUSPIRONE HCL 7.5 MG PO TABS: 7.5000 mg | ORAL_TABLET | Freq: Every day | ORAL | 1 refills | Status: AC

## 2023-06-05 MED ORDER — HYDROXYZINE HCL 10 MG PO TABS: 10.0000 mg | ORAL_TABLET | Freq: Every evening | ORAL | 1 refills | Status: DC | PRN
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
## 2023-06-07 ENCOUNTER — Other Ambulatory Visit: Payer: Self-pay

## 2023-06-09 ENCOUNTER — Other Ambulatory Visit: Payer: Self-pay

## 2023-06-09 ENCOUNTER — Other Ambulatory Visit (HOSPITAL_COMMUNITY): Payer: Self-pay

## 2023-06-09 ENCOUNTER — Encounter (HOSPITAL_COMMUNITY): Payer: Self-pay

## 2023-06-16 ENCOUNTER — Other Ambulatory Visit: Payer: Self-pay

## 2023-07-16 ENCOUNTER — Encounter (HOSPITAL_COMMUNITY): Payer: Self-pay | Admitting: Emergency Medicine

## 2023-07-16 ENCOUNTER — Ambulatory Visit (HOSPITAL_COMMUNITY)
Admission: EM | Admit: 2023-07-16 | Discharge: 2023-07-16 | Disposition: A | Discharge status: Home / Self Care | Payer: BC Managed Care – PPO | Attending: Family Medicine | Admitting: Family Medicine

## 2023-07-16 DIAGNOSIS — J309 Allergic rhinitis, unspecified: Secondary | ICD-10-CM | POA: Diagnosis present

## 2023-07-16 DIAGNOSIS — Z79899 Other long term (current) drug therapy: Secondary | ICD-10-CM | POA: Diagnosis not present

## 2023-07-16 DIAGNOSIS — R432 Parageusia: Secondary | ICD-10-CM | POA: Diagnosis present

## 2023-07-16 DIAGNOSIS — R430 Anosmia: Secondary | ICD-10-CM | POA: Insufficient documentation

## 2023-07-16 LAB — COMPREHENSIVE METABOLIC PANEL
ALT: 15 U/L (ref 0–44)
AST: 20 U/L (ref 15–41)
Albumin: 3.8 g/dL (ref 3.5–5.0)
Alkaline Phosphatase: 77 U/L (ref 38–126)
Anion gap: 10 (ref 5–15)
BUN: 11 mg/dL (ref 6–20)
CO2: 27 mmol/L (ref 22–32)
Calcium: 9.3 mg/dL (ref 8.9–10.3)
Chloride: 100 mmol/L (ref 98–111)
Creatinine, Ser: 0.93 mg/dL (ref 0.44–1.00)
GFR, Estimated: >60 mL/min (ref >60)
Glucose, Bld: 90 mg/dL (ref 70–99)
Potassium: 3.6 mmol/L (ref 3.5–5.1)
Sodium: 137 mmol/L (ref 135–145)
Total Bilirubin: 0.3 mg/dL (ref 0.0–1.2)
Total Protein: 7.2 g/dL (ref 6.5–8.1)

## 2023-07-16 LAB — CBC WITH DIFFERENTIAL/PLATELET
Abs Immature Granulocytes: 0.08 10*3/uL — ABNORMAL HIGH (ref 0.00–0.07)
Basophils Absolute: 0.1 10*3/uL (ref 0.0–0.1)
Basophils Relative: 0 %
Eosinophils Absolute: 0.1 10*3/uL (ref 0.0–0.5)
Eosinophils Relative: 1 %
HCT: 37.6 % (ref 36.0–46.0)
Hemoglobin: 11.9 g/dL — ABNORMAL LOW (ref 12.0–15.0)
Immature Granulocytes: 1 %
Lymphocytes Relative: 15 %
Lymphs Abs: 2.5 10*3/uL (ref 0.7–4.0)
MCH: 20.8 pg — ABNORMAL LOW (ref 26.0–34.0)
MCHC: 31.6 g/dL (ref 30.0–36.0)
MCV: 65.8 fL — ABNORMAL LOW (ref 80.0–100.0)
Monocytes Absolute: 0.7 10*3/uL (ref 0.1–1.0)
Monocytes Relative: 4 %
Neutro Abs: 13.7 10*3/uL — ABNORMAL HIGH (ref 1.7–7.7)
Neutrophils Relative %: 79 %
Platelets: 320 10*3/uL (ref 150–400)
RBC: 5.71 MIL/uL — ABNORMAL HIGH (ref 3.87–5.11)
RDW: 18.4 % — ABNORMAL HIGH (ref 11.5–15.5)
WBC: 17.1 10*3/uL — ABNORMAL HIGH (ref 4.0–10.5)
nRBC: 0 % (ref 0.0–0.2)

## 2023-07-16 LAB — TSH: TSH: 3.633 u[IU]/mL (ref 0.350–4.500)

## 2023-07-16 MED ORDER — FLUTICASONE PROPIONATE 50 MCG/ACT NA SUSP: 2.0000 | Freq: Every day | NASAL | 0 refills | Status: DC

## 2023-07-16 NOTE — ED Provider Notes (Signed)
 [facility]    CSN: [id number] Arrival date & time: 07/16/23  1647      History   Chief Complaint Chief Complaint  Patient presents with   loss of taste and smell   Shortness of Breath   Cough    HPI Pecolia Talla Steury is a 43 y.o. female.   Here for trouble with her sense of taste and smell.  It has been going on for about 1 month.  She can taste just a little bit.  She will smell smoke when no other worsening around her does. No current sinus pressure or nasal congestion.  No fever.  Prior to this starting she did have an upper respiratory infection.  As that was clearing up is when this trouble with her sense of taste and smell began.  She does still have a little nasal drainage that she feels is more seasonal allergies.  She was tested for COVID and flu during that upper respiratory infection and they were negative.  She takes medications for blood pressure  She is allergic to penicillins and sulfa.  Last menstrual cycle was February 15  Her primary care has referred her to ENT and she will see them toward the end of March.  No associated other neurologic symptoms.   Past Medical History:  Diagnosis Date   Allergy    Anxiety    Depression    Hypertension     Patient Active Problem List   Diagnosis Date Noted   Moderate episode of recurrent major depressive disorder (HCC) 11/03/2022   Fatigue 02/26/2022   Sleep disturbance 02/26/2022   PTSD (post-traumatic stress disorder) 11/05/2020   GAD (generalized anxiety disorder) 11/05/2020   Attention deficit hyperactivity disorder (ADHD), combined type 11/05/2020   History of iron deficiency anemia 11/02/2017   Essential hypertension 12/03/2016   Healthcare maintenance 11/17/2016   Dysuria 11/17/2016    Past Surgical History:  Procedure Laterality Date   CHOLECYSTECTOMY      OB History   No obstetric history on file.      Home Medications    Prior to Admission medications    Medication Sig Start Date End Date Taking? Authorizing Provider  cholecalciferol (VITAMIN D3) 25 MCG (1000 UNIT) tablet Take by mouth. 02/21/19  Yes [provider]  fluticasone (FLONASE) 50 MCG/ACT nasal spray Place 2 sprays into both nostrils daily. 07/16/23  Yes Zenia Resides, MD  acetaminophen (TYLENOL) 325 MG tablet Take 2 tablets (650 mg total) by mouth every 6 (six) hours as needed. 12/16/19   Darr, Gerilyn Pilgrim, PA-C  amLODipine (NORVASC) 5 MG tablet Take 5 mg by mouth daily. 04/03/22   [provider]  atomoxetine (STRATTERA) 60 MG capsule Take 1 capsule (60 mg total) by mouth daily. 06/05/23 09/03/23  Nwoko, Tommas Olp, PA  busPIRone (BUSPAR) 7.5 MG tablet Take 1 tablet (7.5 mg total) by mouth daily. 06/05/23   Nwoko, Tommas Olp, PA  dextromethorphan (DELSYM) 30 MG/5ML liquid Take 60 mg by mouth every 12 (twelve) hours as needed for cough. Patient not taking: Reported on 09/16/2021    [provider]  hydrochlorothiazide (HYDRODIURIL) 25 MG tablet Take 25 mg by mouth 2 (two) times daily.    [provider]  hydrOXYzine (ATARAX) 10 MG tablet Take 1 tablet (10 mg total) by mouth at bedtime as needed. 06/05/23   Nwoko, Tommas Olp, PA  loratadine (CLARITIN) 10 MG tablet Take 10 mg by mouth daily. Patient not taking: Reported on 02/26/2022  [provider]  melatonin 3 MG TABS tablet Take 1 tablet (3 mg total) by mouth at bedtime. 11/03/22   Nwoko, Tommas Olp, PA  sertraline (ZOLOFT) 100 MG tablet Take 2 tablets (200 mg total) by mouth daily. 06/05/23 09/03/23  Nwoko, Tommas Olp, PA  Vitamin D, Ergocalciferol, (DRISDOL) 1.25 MG (50000 UNIT) CAPS capsule Take 50,000 Units by mouth once a week. 07/15/21   [provider]    Family History Family History  Problem Relation Age of Onset   Diabetes Mother    Hyperlipidemia Mother    Hypertension Mother    Intellectual disability Mother    Diabetes Father    Hyperlipidemia Father    Hypertension Father     Intellectual disability Father    Diabetes Sister    Intellectual disability Maternal Grandmother    Cancer Maternal Grandfather    Stroke Maternal Grandfather    Diabetes Maternal Grandfather    Intellectual disability Paternal Grandmother    Heart disease Paternal Grandmother    Cancer Paternal Grandmother    Heart disease Paternal Grandfather    Hyperlipidemia Sister     Social History Social History   Tobacco Use   Smoking status: Never   Smokeless tobacco: Never  Vaping Use   Vaping status: Never Used  Substance Use Topics   Alcohol use: Not Currently   Drug use: Never     Allergies   Lactose, Milk-related compounds, Penicillins, and Sulfamethoxazole-trimethoprim   Review of Systems Review of Systems  Respiratory:  Positive for cough and shortness of breath.      Physical Exam Triage Vital Signs ED Triage Vitals  Encounter Vitals Group     BP 07/16/23 1820 (!) 163/97     Systolic BP Percentile --      Diastolic BP Percentile --      Pulse Rate 07/16/23 1820 73     Resp 07/16/23 1820 18     Temp 07/16/23 1820 98.3 F (36.8 C)     Temp Source 07/16/23 1820 Oral     SpO2 07/16/23 1820 98 %     Weight --      Height --      Head Circumference --      Peak Flow --      Pain Score 07/16/23 1817 0     Pain Loc --      Pain Education --      Exclude from Growth Chart --    No data found.  Updated Vital Signs BP (!) 163/97 (BP Location: Right Arm)   Pulse 73   Temp 98.3 F (36.8 C) (Oral)   Resp 18   LMP 07/03/2023   SpO2 98%   Visual Acuity Right Eye Distance:   Left Eye Distance:   Bilateral Distance:    Right Eye Near:   Left Eye Near:    Bilateral Near:     Physical Exam Vitals reviewed.  Constitutional:      General: She is not in acute distress.    Appearance: She is not toxic-appearing.  HENT:     Right Ear: Tympanic membrane and ear canal normal.     Left Ear: Tympanic membrane and ear canal normal.     Nose: Nose normal.      Mouth/Throat:     Mouth: Mucous membranes are moist.     Pharynx: No oropharyngeal exudate or posterior oropharyngeal erythema.  Eyes:     Extraocular Movements: Extraocular movements intact.     Conjunctiva/sclera:  Conjunctivae normal.     Pupils: Pupils are equal, round, and reactive to light.  Cardiovascular:     Rate and Rhythm: Normal rate and regular rhythm.     Heart sounds: No murmur heard. Pulmonary:     Effort: Pulmonary effort is normal. No respiratory distress.     Breath sounds: No stridor. No wheezing, rhonchi or rales.  Chest:     Chest wall: No tenderness.  Musculoskeletal:     Cervical back: Neck supple.  Lymphadenopathy:     Cervical: No cervical adenopathy.  Skin:    Capillary Refill: Capillary refill takes less than 2 seconds.     Coloration: Skin is not jaundiced or pale.  Neurological:     General: No focal deficit present.     Mental Status: She is alert and oriented to person, place, and time.     Cranial Nerves: No cranial nerve deficit.  Psychiatric:        Behavior: Behavior normal.      UC Treatments / Results  Labs (all labs ordered are listed, but only abnormal results are displayed) Labs Reviewed  CBC WITH DIFFERENTIAL/PLATELET  COMPREHENSIVE METABOLIC PANEL  TSH    EKG   Radiology No results found.  Procedures Procedures (including critical care time)  Medications Ordered in UC Medications - No data to display  Initial Impression / Assessment and Plan / UC Course  I have reviewed the triage vital signs and the nursing notes.  Pertinent labs & imaging results that were available during my care of the patient were reviewed by me and considered in my medical decision making (see chart for details).     Since she has some seasonal allergy symptoms, medicine and Flonase.  CBC, CMP, and TSH are drawn to further evaluate her symptoms.  She is given contact information for neurology.  She will keep her ENT  appointment.   Final Clinical Impressions(s) / UC Diagnoses   Final diagnoses:  Anosmia  Ageusia  Allergic rhinitis, unspecified seasonality, unspecified trigger     Discharge Instructions      Fluticasone/Flonase nose spray--put 2 sprays in each nostril once daily  We have drawn blood to check your blood counts, kidney and liver function and electrolytes and sugar, and thyroid function.  Staff will notify you if anything is significantly abnormal.       ED Prescriptions     Medication Sig Dispense Auth. Provider   fluticasone (FLONASE) 50 MCG/ACT nasal spray Place 2 sprays into both nostrils daily. 16 g Zenia Resides, MD      PDMP not reviewed this encounter.   Zenia Resides, MD 07/16/23 [phone]

## 2023-07-16 NOTE — ED Triage Notes (Signed)
 Patient presents with c/o loss of smell and taste x 1 month. Patient reports occasional dyspnea and coughing at night.

## 2023-07-16 NOTE — Discharge Instructions (Signed)
 Fluticasone/Flonase nose spray--put 2 sprays in each nostril once daily  We have drawn blood to check your blood counts, kidney and liver function and electrolytes and sugar, and thyroid function.  Staff will notify you if anything is significantly abnormal.

## 2023-07-22 ENCOUNTER — Telehealth (HOSPITAL_COMMUNITY): Payer: Self-pay | Admitting: *Deleted

## 2023-07-22 NOTE — Telephone Encounter (Signed)
 Patient called wanting to let her MD know that she has stopped taking her Buspar about 2 week ago. She thinks she might have had a possible seizure because she lost her sense of taste and smell. Now she is getting her sense of smell back and is smelling cigarettes all the time. Called to let her MD know all she has been dealing with.

## 2023-07-28 ENCOUNTER — Telehealth: Payer: Self-pay | Admitting: Family Medicine

## 2023-07-28 ENCOUNTER — Telehealth (INDEPENDENT_AMBULATORY_CARE_PROVIDER_SITE_OTHER): Payer: No Payment, Other | Admitting: Physician Assistant

## 2023-07-28 DIAGNOSIS — F411 Generalized anxiety disorder: Secondary | ICD-10-CM | POA: Diagnosis not present

## 2023-07-28 DIAGNOSIS — F331 Major depressive disorder, recurrent, moderate: Secondary | ICD-10-CM | POA: Diagnosis not present

## 2023-07-28 DIAGNOSIS — F4310 Post-traumatic stress disorder, unspecified: Secondary | ICD-10-CM

## 2023-07-28 DIAGNOSIS — F902 Attention-deficit hyperactivity disorder, combined type: Secondary | ICD-10-CM

## 2023-07-28 NOTE — Telephone Encounter (Signed)
 Copied from CRM [id number]. Topic: Clinical - Medication Question >> Jul 28, 2023  3:10 PM Geneva B wrote: Reason for CRM: [facility] calling has questions about rx please call [phone] ask for eddie

## 2023-07-29 ENCOUNTER — Telehealth: Payer: Self-pay | Admitting: Family Medicine

## 2023-07-29 ENCOUNTER — Encounter (HOSPITAL_COMMUNITY): Payer: Self-pay | Admitting: Physician Assistant

## 2023-07-29 NOTE — Telephone Encounter (Signed)
 Copied from CRM [id number]. Topic: Clinical - Medication Question >> Jul 28, 2023  3:10 PM Geneva B wrote: Reason for CRM: [facility] calling has questions about rx please call [phone] ask for eddie. This RN called and left message for [employer] re: rx.  Left two numbers for Link [employer]uffer [phone] and [phone] to help assist with his questions.
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
## 2023-07-30 ENCOUNTER — Telehealth (HOSPITAL_COMMUNITY): Payer: Self-pay | Admitting: Physician Assistant

## 2023-07-30 NOTE — Telephone Encounter (Signed)
 Message acknowledged and reviewed.  Patient had an appointment scheduled with this provider on 07/28/2023.  During her past encounter, patient informed provider that she discontinued taking buspirone at a field that the medication causes her to have a seizure resulting in loss of taste and smell.  She reports that she is able to smell at this point in time but now she is smelling cigarettes everywhere due to her neighboring tenants.  Patient agreed to discontinue taking buspirone.

## 2023-08-01 ENCOUNTER — Other Ambulatory Visit (HOSPITAL_COMMUNITY): Payer: Self-pay | Admitting: Physician Assistant

## 2023-08-01 DIAGNOSIS — F4310 Post-traumatic stress disorder, unspecified: Secondary | ICD-10-CM

## 2023-08-01 DIAGNOSIS — F331 Major depressive disorder, recurrent, moderate: Secondary | ICD-10-CM

## 2023-08-01 DIAGNOSIS — F411 Generalized anxiety disorder: Secondary | ICD-10-CM

## 2023-08-03 ENCOUNTER — Other Ambulatory Visit (HOSPITAL_COMMUNITY): Payer: Self-pay | Admitting: Physician Assistant

## 2023-08-03 ENCOUNTER — Other Ambulatory Visit (HOSPITAL_COMMUNITY): Payer: Self-pay

## 2023-08-03 ENCOUNTER — Other Ambulatory Visit: Payer: Self-pay

## 2023-08-03 DIAGNOSIS — F902 Attention-deficit hyperactivity disorder, combined type: Secondary | ICD-10-CM

## 2023-08-04 NOTE — Telephone Encounter (Signed)
 Message acknowledged and reviewed. Provider spoke with patient regarding pharmacy options. Provider provided patient with numbers for [facility].

## 2023-08-06 ENCOUNTER — Other Ambulatory Visit: Payer: Self-pay

## 2023-08-12 ENCOUNTER — Other Ambulatory Visit: Payer: Self-pay

## 2023-08-12 ENCOUNTER — Other Ambulatory Visit (HOSPITAL_COMMUNITY): Payer: Self-pay | Admitting: Physician Assistant

## 2023-08-12 ENCOUNTER — Other Ambulatory Visit (HOSPITAL_COMMUNITY): Payer: Self-pay

## 2023-08-12 DIAGNOSIS — F902 Attention-deficit hyperactivity disorder, combined type: Secondary | ICD-10-CM

## 2023-08-12 MED ORDER — ATOMOXETINE HCL 60 MG PO CAPS
60.0000 mg | ORAL_CAPSULE | Freq: Every day | ORAL | 0 refills | Status: DC
  Filled 2023-08-12: qty 30, 30d supply, fill #0

## 2023-08-16 ENCOUNTER — Ambulatory Visit (INDEPENDENT_AMBULATORY_CARE_PROVIDER_SITE_OTHER): Payer: Self-pay | Admitting: Otolaryngology

## 2023-08-16 ENCOUNTER — Encounter (INDEPENDENT_AMBULATORY_CARE_PROVIDER_SITE_OTHER): Payer: Self-pay | Admitting: Otolaryngology

## 2023-08-16 VITALS — BP 141/87 | HR 96 | Ht 66.0 in | Wt 230.0 lb

## 2023-08-16 DIAGNOSIS — R430 Anosmia: Secondary | ICD-10-CM | POA: Diagnosis not present

## 2023-08-16 DIAGNOSIS — R438 Other disturbances of smell and taste: Secondary | ICD-10-CM

## 2023-08-16 DIAGNOSIS — R0982 Postnasal drip: Secondary | ICD-10-CM

## 2023-08-16 DIAGNOSIS — J343 Hypertrophy of nasal turbinates: Secondary | ICD-10-CM

## 2023-08-16 DIAGNOSIS — R0981 Nasal congestion: Secondary | ICD-10-CM

## 2023-08-16 DIAGNOSIS — J3489 Other specified disorders of nose and nasal sinuses: Secondary | ICD-10-CM

## 2023-08-16 DIAGNOSIS — J3089 Other allergic rhinitis: Secondary | ICD-10-CM

## 2023-08-16 DIAGNOSIS — R432 Parageusia: Secondary | ICD-10-CM

## 2023-08-16 DIAGNOSIS — J342 Deviated nasal septum: Secondary | ICD-10-CM | POA: Diagnosis not present

## 2023-08-16 MED ORDER — LORATADINE 10 MG PO TABS: 10.0000 mg | ORAL_TABLET | Freq: Every day | ORAL | 11 refills | Status: AC

## 2023-08-16 MED ORDER — FLUTICASONE PROPIONATE 50 MCG/ACT NA SUSP: 2.0000 | Freq: Two times a day (BID) | NASAL | 6 refills | Status: AC

## 2023-08-16 NOTE — Patient Instructions (Addendum)
 Olfactory Training: +  Several times per day, place the following up to your nose and make attempts to smell them for at least 20 seconds at a time. Do this at least twice per day with each odorant. You may substitute or add other strong odorants if you prefer.You also may place them in a paper bag and pace the open end of the bag to your face to smell the odor. Lemon peel Rose Cloves Eucalyptus  I also have people try this as frequently as possible with other odorants (coffee, orange peel, etc).  Other ideas for anosmia: topical treatment with vitamin A, nasal steroid irrigations (not just spray), omega-3, topical sodium citrate  Lloyd Huger Med Nasal Saline Rinse   - start nasal saline rinses with [employer] Bottle available over the counter or online to help with nasal congestion

## 2023-08-16 NOTE — Progress Notes (Signed)
 ENT CONSULT:  Reason for Consult: decreased sense of smell and taste x 3 months  HPI: Discussed the use of AI scribe software for clinical note transcription with the patient, who gave verbal consent to proceed.  History of Present Illness Angie Roberts is a 43 year old female who presents with loss of smell and taste following a viral illness.  In late January, she experienced a severe cold with persistent nasal congestion. She initially managed her symptoms with frequent nose blowing but developed significant nasal stuffiness. She sought medical attention at [facility], where she was tested for COVID-19 (negative) and prescribed an antibiotic due to concerns about possible pneumonia.  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 She describes being able to detect only very light smells after starting Flonase. Despite some improvement in taste, her sense of smell remains significantly impaired.  She has chronic nasal congestion, which improved with Flonase. She takes Claritin daily, especially during pollen season, to manage her symptoms. She discontinued using a humidifier recently due to concerns about chest discomfort, but this did not improve her sense of smell.  She has been using Flonase, which provided some relief, but her symptoms returned after running out of the medication. No history of nasal trauma or significant nasal injuries.  Records Reviewed:  Urgent Care note 07/16/23 Here for trouble with her sense of taste and smell.  It has been going on for about 1 month.  She can taste just a little bit.  She will smell smoke when no other worsening around her does. No current sinus pressure or nasal congestion.  No fever.   Prior to this starting she did have an upper respiratory infection.  As that was clearing up is when this trouble with her sense of taste and  smell began.  She does still have a little nasal drainage that she feels is more seasonal allergies.   She was tested for COVID and flu during that upper respiratory infection and they were negative.   She takes medications for blood pressure   She is allergic to penicillins and sulfa.   Last menstrual cycle was February 15   Her primary care has referred her to ENT and she will see them toward the end of March.   No associated other neurologic symptoms.    Past Medical History:  Diagnosis Date   Allergy    Anxiety    Depression    Hypertension     Past Surgical History:  Procedure Laterality Date   CHOLECYSTECTOMY      Family History  Problem Relation Age of Onset   Diabetes Mother    Hyperlipidemia Mother    Hypertension Mother    Intellectual disability Mother    Diabetes Father    Hyperlipidemia Father    Hypertension Father    Intellectual disability Father    Diabetes Sister    Intellectual disability Maternal Grandmother    Cancer Maternal Grandfather    Stroke Maternal Grandfather    Diabetes Maternal Grandfather    Intellectual disability Paternal Grandmother    Heart disease Paternal Grandmother    Cancer Paternal Grandmother    Heart disease Paternal Grandfather    Hyperlipidemia Sister     Social History:  reports that she has never smoked. She has never used smokeless tobacco. She reports that she does not currently use alcohol. She reports that she does not use  drugs.  Allergies:  Allergies  Allergen Reactions   Lactose Nausea And Vomiting    PT states milk/ dairy products "causes a lot of bloating and gas." PT states milk/ dairy products "causes a lot of bloating and gas."    Milk-Related Compounds Other (See Comments)   Penicillins Other (See Comments)    As a child, unsure of reaction    Sulfamethoxazole-Trimethoprim Rash    Medications: I have reviewed the patient's current medications.  The PMH, PSH, Medications, Allergies, and SH  were reviewed and updated.  ROS: Constitutional: Negative for fever, weight loss and weight gain. Cardiovascular: Negative for chest pain and dyspnea on exertion. Respiratory: Is not experiencing shortness of breath at rest. Gastrointestinal: Negative for nausea and vomiting. Neurological: Negative for headaches. Psychiatric: The patient is not nervous/anxious  Blood pressure (!) 141/87, pulse 96, height 5\' 6"  (1.676 m), weight 230 lb (104.3 kg), SpO2 97%. Body mass index is 37.12 kg/m.  PHYSICAL EXAM:  Exam: General: Well-developed, well-nourished Respiratory Respiratory effort: Equal inspiration and expiration without stridor Cardiovascular Peripheral Vascular: Warm extremities with equal color/perfusion Eyes: No nystagmus with equal extraocular motion bilaterally Neuro/Psych/Balance: Patient oriented to person, place, and time; Appropriate mood and affect; Gait is intact with no imbalance; Cranial nerves I-XII are intact Head and Face Inspection: Normocephalic and atraumatic without mass or lesion Palpation: Facial skeleton intact without bony stepoffs Salivary Glands: No mass or tenderness Facial Strength: Facial motility symmetric and full bilaterally ENT Pinna: External ear intact and fully developed External canal: Canal is patent with intact skin Tympanic Membrane: Clear and mobile External Nose: No scar or anatomic deformity Internal Nose: Septum is deviated to the left with narrowing of the nasal passages posteriorly No polyp, or purulence. Mucosal edema and erythema present.  Bilateral inferior turbinate hypertrophy.  Lips, Teeth, and gums: Mucosa and teeth intact and viable TMJ: No pain to palpation with full mobility Oral cavity/oropharynx: No erythema or exudate, no lesions present Nasopharynx: No mass or lesion with intact mucosa Neck Neck and Trachea: Midline trachea without mass or lesion Thyroid: No mass or nodularity Lymphatics: No  lymphadenopathy  Procedure:   PROCEDURE NOTE: nasal endoscopy  Preoperative diagnosis: chronic nasal congestion/ansomia symptoms  Postoperative diagnosis: same  Procedure: Diagnostic nasal endoscopy (16109)  Surgeon: Ashok Croon, M.D.  Anesthesia: Topical lidocaine and Afrin  H&P REVIEW: The patient's history and physical were reviewed today prior to procedure. All medications were reviewed and updated as well. Complications: None Condition is stable throughout exam Indications and consent: The patient presents with symptoms of chronic sinusitis not responding to previous therapies. All the risks, benefits, and potential complications were reviewed with the patient preoperatively and informed consent was obtained. The time out was completed with confirmation of the correct procedure.   Procedure: The patient was seated upright in the clinic. Topical lidocaine and Afrin were applied to the nasal cavity. After adequate anesthesia had occurred, the rigid nasal endoscope was passed into the nasal cavity. The nasal mucosa, turbinates, septum, and sinus drainage pathways were visualized bilaterally. This revealed no purulence or significant secretions that might be cultured. There were no polyps or sites of significant inflammation. The mucosa was intact and there were no crusting present. The scope was then slowly withdrawn and the patient tolerated the procedure well. There were no complications or blood loss.    Assessment/Plan: Encounter Diagnoses  Name Primary?   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 Symptoms improved  slightly with Flonase, indicating nasal congestion as a contributing factor. No neurological risk factors present and no hx of head trauma. Sx suggesting a viral etiology. Septal deviation noted on nasal endoscopy but no masses or polyps and no purulence. Recovery may take time and may not fully return to baseline. Neurology referral not indicated due to lack of neurological risk factors. - Prescribe Flonase with multiple refills - Recommend smell retraining therapy with prominent smells like lemon peel, cloves, eucalyptus, caffeine - Advised nasal rinses with saline and use of Flonase afterwards - Consider topical omega-3 supplements or topical sodium citrate - Continue taking Zyrtec or Claritin 10 mg at night for allergy control - Advise follow-up as needed  Chronic nasal congestion suspected environmental allergies  Evidence of post-nasal drainage during scope exam today, could be contributing to her sx - trial of Claritin 10 mg daily and Flonase 2 puffs b/l nares BID - consider nasal saline rinses  - consider allergy testing in the future    Deviated Nasal Septum Septal deviation veering towards the left and slightly to the right with narrowing of the left nasal passage. No masses, polyps, or pus suggesting chronic sinus infection. Not currently contributing significantly to anosmia and ageusia. - will observe for now - medical management of chronic nasal congestion as above      Thank you for allowing me to participate in the care of this patient. Please do not hesitate to contact me with any questions or concerns.   Ashok Croon, MD Otolaryngology [facility] Phone: [phone] Fax: [phone]    08/16/2023, 1:50 PM

## 2023-09-10 ENCOUNTER — Other Ambulatory Visit (HOSPITAL_COMMUNITY): Payer: Self-pay | Admitting: Physician Assistant

## 2023-09-10 ENCOUNTER — Other Ambulatory Visit (HOSPITAL_COMMUNITY): Payer: Self-pay

## 2023-09-13 ENCOUNTER — Other Ambulatory Visit: Payer: Self-pay

## 2023-09-17 ENCOUNTER — Other Ambulatory Visit (HOSPITAL_COMMUNITY): Payer: Self-pay | Admitting: Physician Assistant

## 2023-09-17 ENCOUNTER — Other Ambulatory Visit (HOSPITAL_COMMUNITY): Payer: Self-pay

## 2023-09-20 ENCOUNTER — Other Ambulatory Visit (HOSPITAL_COMMUNITY): Payer: Self-pay | Admitting: Physician Assistant

## 2023-09-23 ENCOUNTER — Other Ambulatory Visit (HOSPITAL_COMMUNITY): Payer: Self-pay

## 2023-09-24 ENCOUNTER — Other Ambulatory Visit (HOSPITAL_COMMUNITY): Payer: Self-pay

## 2023-09-27 ENCOUNTER — Other Ambulatory Visit (HOSPITAL_COMMUNITY): Payer: Self-pay

## 2023-09-27 ENCOUNTER — Telehealth (HOSPITAL_COMMUNITY): Payer: Self-pay

## 2023-09-27 NOTE — Telephone Encounter (Signed)
 Medication refill - Telephone call with patient, after she left a message she would like for Freddrick Jaffe, PA to refill her Atomoxetine  one last time until she sees Dan Dun, NP at the [facility] practice 10/06/23. Patient last seen by Freddrick Jaffe, PA with medications continued by review of record that day, 07/28/23, and last ordered 06/05/23.  Patient stated she had 2 pills left but would like a new order sent in prior to seeing new NP in the coming week.  Agreed to send request to Freddrick Jaffe, NP who this nurse informed was off today but back in the office on 09/28/23.

## 2023-09-28 ENCOUNTER — Other Ambulatory Visit: Payer: Self-pay

## 2023-09-28 ENCOUNTER — Other Ambulatory Visit (HOSPITAL_COMMUNITY): Payer: Self-pay

## 2023-09-28 ENCOUNTER — Other Ambulatory Visit (HOSPITAL_COMMUNITY): Payer: Self-pay | Admitting: Physician Assistant

## 2023-09-28 ENCOUNTER — Encounter (HOSPITAL_COMMUNITY): Payer: Self-pay

## 2023-09-28 DIAGNOSIS — F411 Generalized anxiety disorder: Secondary | ICD-10-CM

## 2023-09-28 DIAGNOSIS — F902 Attention-deficit hyperactivity disorder, combined type: Secondary | ICD-10-CM

## 2023-09-28 MED ORDER — HYDROXYZINE HCL 10 MG PO TABS
10.0000 mg | ORAL_TABLET | Freq: Every evening | ORAL | 0 refills | Status: DC | PRN
  Filled 2023-09-28 – 2023-09-30 (×2): qty 30, 30d supply, fill #0

## 2023-09-28 MED ORDER — ATOMOXETINE HCL 60 MG PO CAPS
60.0000 mg | ORAL_CAPSULE | Freq: Every day | ORAL | 0 refills | Status: DC
  Filled 2023-09-28: qty 90, 90d supply, fill #0
  Filled 2023-09-30 (×2): qty 30, 30d supply, fill #0

## 2023-09-28 NOTE — Telephone Encounter (Signed)
 Message acknowledged and reviewed. See note. Patient's medication to be e-prescribed to pharmacy of choice.

## 2023-09-28 NOTE — Progress Notes (Signed)
 Patient was contacted by Marlou Sims. Carolynne Citron, RN regarding patient's request for Strattera  refill. Provider was able to reach out to patient regarding her request. In addition to needing refills on her Strattera , patient also states that she needs refills on her hydroxyzine . Patient's medications to be e-prescribed to pharmacy of choice.  Patient has an appointment scheduled with Dan Dun, NP on 10/06/2023.

## 2023-09-30 ENCOUNTER — Encounter (HOSPITAL_COMMUNITY): Payer: Self-pay | Admitting: Pharmacist

## 2023-09-30 ENCOUNTER — Other Ambulatory Visit (HOSPITAL_COMMUNITY): Payer: Self-pay

## 2023-10-01 ENCOUNTER — Other Ambulatory Visit: Payer: Self-pay

## 2023-10-06 ENCOUNTER — Ambulatory Visit (HOSPITAL_COMMUNITY): Admitting: Family

## 2023-10-06 VITALS — BP 137/86 | HR 96 | Ht 66.0 in | Wt 228.0 lb

## 2023-10-06 DIAGNOSIS — Z7689 Persons encountering health services in other specified circumstances: Secondary | ICD-10-CM

## 2023-10-06 DIAGNOSIS — F411 Generalized anxiety disorder: Secondary | ICD-10-CM

## 2023-10-06 DIAGNOSIS — F902 Attention-deficit hyperactivity disorder, combined type: Secondary | ICD-10-CM

## 2023-10-06 DIAGNOSIS — F331 Major depressive disorder, recurrent, moderate: Secondary | ICD-10-CM

## 2023-10-06 DIAGNOSIS — F4310 Post-traumatic stress disorder, unspecified: Secondary | ICD-10-CM

## 2023-10-06 MED ORDER — ATOMOXETINE HCL 60 MG PO CAPS
60.0000 mg | ORAL_CAPSULE | Freq: Every day | ORAL | 0 refills | Status: DC
Start: 2023-10-06 — End: 2023-11-23

## 2023-10-06 MED ORDER — HYDROXYZINE HCL 10 MG PO TABS: 10.0000 mg | ORAL_TABLET | Freq: Every evening | ORAL | 0 refills | Status: DC | PRN

## 2023-10-06 MED ORDER — SERTRALINE HCL 100 MG PO TABS: 200.0000 mg | ORAL_TABLET | Freq: Every day | ORAL | 0 refills | Status: DC

## 2023-10-06 NOTE — Progress Notes (Signed)
 [facility] MD/PA/NP OP Progress Note  10/06/2023 10:13 AM Angie Roberts  MRN:  [id number]  Chief Complaint: Medication management follow-up appointment  HPI: Angie Roberts 43 year old African-American female presents after transfer of care.  She was previously followed by E.Nwoko PA through [facility] urgent care facility.  "  I do not know why I was asked to come here about was had insurance"   Per chart review patient carries a diagnosis related to generalized anxiety disorder, posttraumatic stress disorder and major depressive disorder.  Currently she is prescribed Zoloft  200 mg, hydroxyzine  10 mg nightly, Strattera  60 mg and BuSpar  7.5 mg daily however states she is not taking BuSpar  medications as directed.   Attempted to engage patient related to past medical history and current stressors.  She presents irritable, blunted and limited with information. "  I guess" and/or "okay"  This provider attempted to inquire about past medical history.  Patient states "my dad" do you know his diagnosis? "  Everything" encourage patient to elaborate with responses.  Minimal feedback.  Inquired about previous inpatient admissions stated " I was supposed to check in last year" did not disclose any other details. - No documented history related to suicide attempts or inpatient admissions.  Provider consulted with E Nwoko, for additional diagnoses and current patient's behavior.  Denied similar experiences.  Encouraged patient to follow-up as needed.  Will make medications available x 30 days.  Angie Roberts is sitting, presents flat , guarded,  slightly distracted; limited engagement throughout this assessment.  Appeared to be irritated by questions.  Minimal responses. she is alert/oriented x 3; she reports she resides alone.  Patient is speaking in a low tone at moderate volume, and normal pace; with fair eye contact. There is no indication that she is currently responding to  internal/external stimuli or experiencing delusional thought content.   Past Psychiatric History:   Past Medical History:  Past Medical History:  Diagnosis Date   Allergy    Anxiety    Depression    Hypertension     Past Surgical History:  Procedure Laterality Date   CHOLECYSTECTOMY      Family Psychiatric History:   Family History:  Family History  Problem Relation Age of Onset   Diabetes Mother    Hyperlipidemia Mother    Hypertension Mother    Intellectual disability Mother    Diabetes Father    Hyperlipidemia Father    Hypertension Father    Intellectual disability Father    Diabetes Sister    Intellectual disability Maternal Grandmother    Cancer Maternal Grandfather    Stroke Maternal Grandfather    Diabetes Maternal Grandfather    Intellectual disability Paternal Grandmother    Heart disease Paternal Grandmother    Cancer Paternal Grandmother    Heart disease Paternal Grandfather    Hyperlipidemia Sister     Social History:  Social History   Socioeconomic History   Marital status: Single    Spouse name: Not on file   Number of children: Not on file   Years of education: Not on file   Highest education level: Not on file  Occupational History   Not on file  Tobacco Use   Smoking status: Never   Smokeless tobacco: Never  Vaping Use   Vaping status: Never Used  Substance and Sexual Activity   Alcohol use: Not Currently   Drug use: Never   Sexual activity: Not Currently  Other Topics Concern   Not on file  Social History Narrative   Not on file   Social Drivers of Health   Financial Resource Strain: Low Risk  (11/05/2020)   Overall Financial Resource Strain (CARDIA)    Difficulty of Paying Living Expenses: Not very hard  Food Insecurity: No Food Insecurity (11/05/2020)   Hunger Vital Sign    Worried About Running Out of Food in the Last Year: Never true    Ran Out of Food in the Last Year: Never true  Transportation Needs: Unmet  Transportation Needs (07/23/2022)   PRAPARE - Transportation    Lack of Transportation (Medical): Yes    Lack of Transportation (Non-Medical): Yes  Physical Activity: Sufficiently Active (07/23/2022)   Exercise Vital Sign    Days of Exercise per Week: 5 days    Minutes of Exercise per Session: 30 min  Stress: Stress Concern Present (07/23/2022)   [employer] of Occupational Health - Occupational Stress Questionnaire    Feeling of Stress : Very much  Social Connections: Socially Isolated (07/23/2022)   Social Connection and Isolation Panel [NHANES]    Frequency of Communication with Friends and Family: Once a week    Frequency of Social Gatherings with Friends and Family: Once a week    Attends Religious Services: Never    Database administrator or Organizations: No    Attends Engineer, structural: Not on file    Marital Status: Never married    Allergies:  Allergies  Allergen Reactions   Lactose Nausea And Vomiting    PT states milk/ dairy products "causes a lot of bloating and gas." PT states milk/ dairy products "causes a lot of bloating and gas."    Milk-Related Compounds Other (See Comments)   Penicillins Other (See Comments)    As a child, unsure of reaction    Sulfamethoxazole-Trimethoprim Rash    Metabolic Disorder Labs: Lab Results  Component Value Date   HGBA1C 6.1 02/27/2013   No results found for: "PROLACTIN" Lab Results  Component Value Date   CHOL 182 11/19/2016   TRIG 96 11/19/2016   HDL 48 11/19/2016   CHOLHDL 3.8 11/19/2016   VLDL 20 02/27/2013   LDLCALC 115 (H) 11/19/2016   LDLCALC 134 (H) 02/27/2013   Lab Results  Component Value Date   TSH 3.633 07/16/2023   TSH 2.08 09/16/2021    Therapeutic Level Labs: No results found for: "LITHIUM" No results found for: "VALPROATE" No results found for: "CBMZ"  Current Medications: Current Outpatient Medications  Medication Sig Dispense Refill   acetaminophen  (TYLENOL ) 325 MG tablet Take 2  tablets (650 mg total) by mouth every 6 (six) hours as needed. 30 tablet 0   amLODipine (NORVASC) 5 MG tablet Take 5 mg by mouth daily.     atomoxetine  (STRATTERA ) 60 MG capsule Take 1 capsule (60 mg total) by mouth daily. 90 capsule 0   busPIRone  (BUSPAR ) 7.5 MG tablet Take 1 tablet (7.5 mg total) by mouth daily. (Patient not taking: Reported on 08/16/2023) 30 tablet 1   cholecalciferol (VITAMIN D3) 25 MCG (1000 UNIT) tablet Take by mouth.     dextromethorphan (DELSYM) 30 MG/5ML liquid Take 60 mg by mouth every 12 (twelve) hours as needed for cough. (Patient not taking: Reported on 09/16/2021)     fluticasone  (FLONASE ) 50 MCG/ACT nasal spray Place 2 sprays into both nostrils 2 (two) times daily. 16 g 6   hydrochlorothiazide  (HYDRODIURIL ) 25 MG tablet Take 25 mg by mouth 2 (two) times daily.     hydrOXYzine  (ATARAX )  10 MG tablet Take 1 tablet (10 mg total) by mouth at bedtime as needed. 30 tablet 0   loratadine  (CLARITIN ) 10 MG tablet Take 1 tablet (10 mg total) by mouth daily. 30 tablet 11   melatonin 3 MG TABS tablet Take 1 tablet (3 mg total) by mouth at bedtime. 30 tablet 1   sertraline  (ZOLOFT ) 100 MG tablet Take 2 tablets (200 mg total) by mouth daily. 60 tablet 1   Vitamin D, Ergocalciferol, (DRISDOL) 1.25 MG (50000 UNIT) CAPS capsule Take 50,000 Units by mouth once a week.     No current facility-administered medications for this visit.     Musculoskeletal: Strength & Muscle Tone: within normal limits Gait & Station: normal Patient leans: N/A  Psychiatric Specialty Exam: Review of Systems  Blood pressure 137/86, pulse 96, height 5\' 6"  (1.676 m), weight 228 lb (103.4 kg).Body mass index is 36.8 kg/m.  General Appearance: Casual  Eye Contact:  Good  Speech:  Clear and Coherent  Volume:  Normal  Mood:  Anxious and Depressed  Affect:  Congruent  Thought Process:  Coherent  Orientation:  Full (Time, Place, and Person)  Thought Content: Logical   Suicidal Thoughts:  No  Homicidal  Thoughts:  No  Memory:  Immediate;   Good Recent;   Good  Judgement:  Good  Insight:  Good  Psychomotor Activity:  Normal  Concentration:  Concentration: Good  Recall:  Good  Fund of Knowledge: Good  Language: Good  Akathisia:  No  Handed:  Right  AIMS (if indicated): not done  Assets:  Communication Skills Desire for Improvement  ADL's:  Intact  Cognition: WNL  Sleep:  Fair   Screenings: GAD-7    Flowsheet Row Video Visit from 07/28/2023 in [facility] Video Visit from 06/04/2023 in [facility] Video Visit from 11/03/2022 in [facility]or from 07/23/2022 in [facility] Video Visit from 07/17/2022 in [facility]  Total GAD-7 Score 8 13 14 9 15       PHQ2-9    Flowsheet Row Video Visit from 07/28/2023 in [facility] Video Visit from 06/04/2023 in [facility] Counselor from 01/25/2023 in [facility] Video Visit from 11/03/2022 in [facility] Counselor from 07/23/2022 in [facility]  PHQ-2 Total Score 4 1 3 3 1   PHQ-9 Total Score 15 -- 12 17 9       Flowsheet Row Video Visit from 07/28/2023 in [facility] from 07/16/2023 in [facility] Video Visit from 06/04/2023 in [facility]  C-SSRS RISK CATEGORY No Risk No Risk No Risk        Assessment and Plan: Angie Roberts is a 43 year old African-American female who presents after transfer of care from [facility] urgent care facility.  Carries a diagnosis with attention deficit disorder, generalized anxiety disorder, major depressive disorder, posttraumatic stress disorder and sleep disturbance.  Documented prescriptions with Strattera  200 mg daily, BuSpar  7.5  mg daily hydroxyzine  10 mg 3 times daily.  Patient reports she is taking medications as directed.  Does not take BuSpar  any longer.  Very minimal throughout this assessment.  Will make medications available x 30 days patient to follow-up 1 month for medication management.    Collaboration of Care: Collaboration of Care: Medication Management AEB will make medications available x  1 month  Patient/Guardian was advised Release of Information must be obtained prior to any record release in order to collaborate their care with an outside provider. Patient/Guardian was advised if they have not already done so to contact the registration department to sign all necessary forms in order for us  to release information regarding their care.   Consent: Patient/Guardian gives verbal consent for treatment and assignment of benefits for services provided during this visit. Patient/Guardian expressed understanding and agreed to proceed.    Levester Reagin, NP 10/06/2023, 10:13 AM

## 2023-10-06 NOTE — Addendum Note (Signed)
 Addended by: Levester Reagin on: 10/06/2023 10:15 AM   Modules accepted: Orders

## 2023-10-07 ENCOUNTER — Telehealth (HOSPITAL_COMMUNITY): Payer: Self-pay

## 2023-10-07 NOTE — Telephone Encounter (Signed)
 Medication management - Call back with patient, after speaking with Freddrick Jaffe, PA and Dan Dun, NP to arrange for patient to stay under the care of PA as she requested. Patient stated no other issues at this time and understanding Freddrick Jaffe, [place] may be booked out further but prefers to come to the [facility] for her services.  Patient also reported if she decides she wants to look into receiving therapy, she will discuss with PA at their next visit.  Contacted our patient access staff at the [facility] outpatient office who will reach out to patient to schedule her next follow up appointment with Freddrick Jaffe, PA back at the [facility] office.

## 2023-10-07 NOTE — Telephone Encounter (Signed)
 Appointment - Call with patient, after getting a notice of patient having a grievance from her visit with a new NP at out [facility] outpatient 10/06/23. Patient stated first she did not understand why she needed to change providers and to start going to the [place] office for medication management services. Patient stated never got a call regarding the new appointment but only saw this on MyChart so when she went she reported also thinking it was to begin seeing a therapist.  Patient felt the NP was rude because she was also asking questions regarding the change in practices but was understanding she was most likely just asking questions to understand as well.  Patient was not happy with having to take off for appointment, having to pay a $50 co-payment and then the appointment did not last long but was understanding the provider did provide the services she was there for, filled her medications and was willing to work with patient.  Patient stated she would prefer to return to her past provider if possible and agreed to follow up to see if this was as possibility or other changes may need to be made.

## 2023-10-13 NOTE — Telephone Encounter (Signed)
 Message acknowledged and reviewed.

## 2023-11-07 ENCOUNTER — Other Ambulatory Visit: Payer: Self-pay

## 2023-11-07 ENCOUNTER — Emergency Department (HOSPITAL_COMMUNITY)

## 2023-11-07 ENCOUNTER — Emergency Department (HOSPITAL_COMMUNITY)
Admission: EM | Admit: 2023-11-07 | Discharge: 2023-11-07 | Disposition: A | Discharge status: Home / Self Care | Attending: Emergency Medicine | Admitting: Emergency Medicine

## 2023-11-07 ENCOUNTER — Encounter (HOSPITAL_COMMUNITY): Payer: Self-pay

## 2023-11-07 DIAGNOSIS — M79671 Pain in right foot: Secondary | ICD-10-CM | POA: Insufficient documentation

## 2023-11-07 DIAGNOSIS — R079 Chest pain, unspecified: Secondary | ICD-10-CM | POA: Diagnosis present

## 2023-11-07 DIAGNOSIS — L292 Pruritus vulvae: Secondary | ICD-10-CM | POA: Insufficient documentation

## 2023-11-07 DIAGNOSIS — B379 Candidiasis, unspecified: Secondary | ICD-10-CM | POA: Insufficient documentation

## 2023-11-07 DIAGNOSIS — Z79899 Other long term (current) drug therapy: Secondary | ICD-10-CM | POA: Insufficient documentation

## 2023-11-07 DIAGNOSIS — D72829 Elevated white blood cell count, unspecified: Secondary | ICD-10-CM | POA: Insufficient documentation

## 2023-11-07 DIAGNOSIS — I10 Essential (primary) hypertension: Secondary | ICD-10-CM | POA: Insufficient documentation

## 2023-11-07 LAB — HCG, SERUM, QUALITATIVE: Preg, Serum: NEGATIVE

## 2023-11-07 LAB — COMPREHENSIVE METABOLIC PANEL WITH GFR
ALT: 14 U/L (ref 0–44)
AST: 17 U/L (ref 15–41)
Albumin: 4 g/dL (ref 3.5–5.0)
Alkaline Phosphatase: 89 U/L (ref 38–126)
Anion gap: 8 (ref 5–15)
BUN: 11 mg/dL (ref 6–20)
CO2: 23 mmol/L (ref 22–32)
Calcium: 9.3 mg/dL (ref 8.9–10.3)
Chloride: 107 mmol/L (ref 98–111)
Creatinine, Ser: 0.79 mg/dL (ref 0.44–1.00)
GFR, Estimated: >60 mL/min (ref >60)
Glucose, Bld: 110 mg/dL — ABNORMAL HIGH (ref 70–99)
Potassium: 3.8 mmol/L (ref 3.5–5.1)
Sodium: 138 mmol/L (ref 135–145)
Total Bilirubin: 0.4 mg/dL (ref 0.0–1.2)
Total Protein: 7.9 g/dL (ref 6.5–8.1)

## 2023-11-07 LAB — CBC WITH DIFFERENTIAL/PLATELET
Abs Immature Granulocytes: 0.07 10*3/uL (ref 0.00–0.07)
Basophils Absolute: 0 10*3/uL (ref 0.0–0.1)
Basophils Relative: 0 %
Eosinophils Absolute: 0.2 10*3/uL (ref 0.0–0.5)
Eosinophils Relative: 1 %
HCT: 38.6 % (ref 36.0–46.0)
Hemoglobin: 12.4 g/dL (ref 12.0–15.0)
Immature Granulocytes: 1 %
Lymphocytes Relative: 19 %
Lymphs Abs: 2.6 10*3/uL (ref 0.7–4.0)
MCH: 21.3 pg — ABNORMAL LOW (ref 26.0–34.0)
MCHC: 32.1 g/dL (ref 30.0–36.0)
MCV: 66.4 fL — ABNORMAL LOW (ref 80.0–100.0)
Monocytes Absolute: 0.6 10*3/uL (ref 0.1–1.0)
Monocytes Relative: 4 %
Neutro Abs: 10.1 10*3/uL — ABNORMAL HIGH (ref 1.7–7.7)
Neutrophils Relative %: 75 %
Platelets: 304 10*3/uL (ref 150–400)
RBC: 5.81 MIL/uL — ABNORMAL HIGH (ref 3.87–5.11)
RDW: 18.5 % — ABNORMAL HIGH (ref 11.5–15.5)
WBC: 13.6 10*3/uL — ABNORMAL HIGH (ref 4.0–10.5)
nRBC: 0 % (ref 0.0–0.2)

## 2023-11-07 LAB — URINALYSIS, W/ REFLEX TO CULTURE (INFECTION SUSPECTED)
Bacteria, UA: NONE SEEN
Bilirubin Urine: NEGATIVE
Glucose, UA: NEGATIVE mg/dL
Hgb urine dipstick: NEGATIVE
Ketones, ur: NEGATIVE mg/dL
Leukocytes,Ua: NEGATIVE
Nitrite: NEGATIVE
Protein, ur: NEGATIVE mg/dL
Specific Gravity, Urine: 1.018 (ref 1.005–1.030)
pH: 6 (ref 5.0–8.0)

## 2023-11-07 LAB — WET PREP, GENITAL
Clue Cells Wet Prep HPF POC: NONE SEEN
Sperm: NONE SEEN
Trich, Wet Prep: NONE SEEN
WBC, Wet Prep HPF POC: <10 (ref <10)

## 2023-11-07 LAB — HIV ANTIBODY (ROUTINE TESTING W REFLEX): HIV Screen 4th Generation wRfx: NONREACTIVE

## 2023-11-07 LAB — TROPONIN I (HIGH SENSITIVITY)
Troponin I (High Sensitivity): 3 ng/L (ref <18)
Troponin I (High Sensitivity): 2 ng/L (ref <18)

## 2023-11-07 MED ORDER — FLUCONAZOLE 150 MG PO TABS: 150.0000 mg | ORAL_TABLET | ORAL | 0 refills | Status: AC

## 2023-11-07 MED ORDER — PANTOPRAZOLE SODIUM 20 MG PO TBEC: 40.0000 mg | DELAYED_RELEASE_TABLET | Freq: Every day | ORAL | 0 refills | Status: AC

## 2023-11-07 NOTE — ED Triage Notes (Signed)
 Pt c/o vaginal itching for the past few months, been self medicating w/o relief. Yesterday she was cleaning with Lysol and began having chest pain after cleaning with it, states she is still having the pain today.  States she feels like she is choking. Also having a foot sore since Friday. Noticed a knot yesterday.

## 2023-11-07 NOTE — ED Provider Notes (Signed)
 [facility] EMERGENCY DEPARTMENT AT [facility] Provider Note   CSN: [id number] Arrival date & time: 11/07/23  9270     Patient presents with: Vaginal Itching, Chest Pain, and Foot Pain   Angie Roberts is a 43 y.o. female.   HPI      Had BV for 2-3 months, saw OB back in May, symptoms didn't go away after that. Used some honey pot.  Vaginal discharge for a few months Monistat last night and burning Can't tell if having abdominal pain, was having cramping with that, had not noticed pain but more that ph balance off, feels liek sweat smells different, taste is different Urinating more frequently, just started burning with the medicine Was going to get azo but saw the monistat and tried Sporty sweaty odor Not sexually active No nausea or vomiting Bad diarrhea yesterday-2 episodes No fevers  Yesterday was cleaning with lysol then started having chest pain Chest pain Not really feeling lightheaded but became nervous because of CP Tightness, like a choking sensation in chest.  Able to eat. But low appetite Doesn't hurt to swallow When lay on side felt a little better with body pillow No dyspnea Cough comes and goes, if sleep a certain way, dry No leg pain or swelling  Constant since yesterday with exception of sleeping  No hx of dvt/pe, long trips, recent surgeries  Older sister and dad have  dad with chf, sister had stents placed early 40s No smoking, etoh or other drugs   Was already having chest pains from yesterday afternoon Has been off hydrochlorothiazide  since Tuesday, waiting for refill to come for delivery   Past Medical History:  Diagnosis Date   Allergy    Anxiety    Depression    Hypertension      Prior to Admission medications   Medication Sig Start Date End Date Taking? Authorizing Provider  fluconazole (DIFLUCAN) 150 MG tablet Take 1 tablet (150 mg total) by mouth every 3 (three) days for 3 doses. 11/07/23 11/14/23 Yes Dreama Longs, MD  pantoprazole (PROTONIX) 20 MG tablet Take 2 tablets (40 mg total) by mouth daily for 14 days. 11/07/23 11/21/23 Yes Dreama Longs, MD  acetaminophen  (TYLENOL ) 325 MG tablet Take 2 tablets (650 mg total) by mouth every 6 (six) hours as needed. 12/16/19   Darr, Jacob, PA-C  amLODipine (NORVASC) 5 MG tablet Take 5 mg by mouth daily. 04/03/22   [provider]  atomoxetine  (STRATTERA ) 60 MG capsule Take 1 capsule (60 mg total) by mouth daily. 10/06/23   Ezzard Staci SAILOR, NP  busPIRone  (BUSPAR ) 7.5 MG tablet Take 1 tablet (7.5 mg total) by mouth daily. Patient not taking: Reported on 08/16/2023 06/05/23   Nwoko, Uchenna E, PA  cholecalciferol (VITAMIN D3) 25 MCG (1000 UNIT) tablet Take by mouth. 02/21/19   [provider]  dextromethorphan (DELSYM) 30 MG/5ML liquid Take 60 mg by mouth every 12 (twelve) hours as needed for cough. Patient not taking: Reported on 09/16/2021    [provider]  fluticasone  (FLONASE ) 50 MCG/ACT nasal spray Place 2 sprays into both nostrils 2 (two) times daily. 08/16/23   Soldatova, Liuba, MD  hydrochlorothiazide  (HYDRODIURIL ) 25 MG tablet Take 25 mg by mouth 2 (two) times daily.    [provider]  hydrOXYzine  (ATARAX ) 10 MG tablet Take 1 tablet (10 mg total) by mouth at bedtime as needed. 10/06/23   Ezzard Staci SAILOR, NP  loratadine  (CLARITIN ) 10 MG tablet Take 1 tablet (10 mg total)  by mouth daily. 08/16/23   Soldatova, Liuba, MD  melatonin 3 MG TABS tablet Take 1 tablet (3 mg total) by mouth at bedtime. 11/03/22   Nwoko, Uchenna E, PA  sertraline  (ZOLOFT ) 100 MG tablet Take 2 tablets (200 mg total) by mouth daily. 10/06/23 01/04/24  Ezzard Staci SAILOR, NP  Vitamin D, Ergocalciferol, (DRISDOL) 1.25 MG (50000 UNIT) CAPS capsule Take 50,000 Units by mouth once a week. 07/15/21   [provider]    Allergies: Lactose, Milk-related compounds, Penicillins, and Sulfamethoxazole-trimethoprim    Review of Systems  Updated Vital Signs BP  121/62   Pulse 76   Temp 98.2 F (36.8 C) (Oral)   Resp 17   Ht 5' 6 (1.676 m)   Wt 104.3 kg   SpO2 99%   BMI 37.12 kg/m   Physical Exam Vitals and nursing note reviewed.  Constitutional:      General: She is not in acute distress.    Appearance: She is well-developed. She is not diaphoretic.  HENT:     Head: Normocephalic and atraumatic.   Eyes:     Conjunctiva/sclera: Conjunctivae normal.    Cardiovascular:     Rate and Rhythm: Normal rate and regular rhythm.     Heart sounds: Normal heart sounds. No murmur heard.    No friction rub. No gallop.  Pulmonary:     Effort: Pulmonary effort is normal. No respiratory distress.     Breath sounds: Normal breath sounds. No wheezing or rales.  Abdominal:     General: There is no distension.     Palpations: Abdomen is soft.     Tenderness: There is no abdominal tenderness. There is no guarding.  Genitourinary:    Comments: discharge  Musculoskeletal:        General: No tenderness.     Cervical back: Normal range of motion.     Comments: 1.5cm swelling overlying tendon dorsum of right foot. Normral pulses, no erythema, no fluctuance   Skin:    General: Skin is warm and dry.     Findings: No erythema or rash.   Neurological:     Mental Status: She is alert and oriented to person, place, and time.     (all labs ordered are listed, but only abnormal results are displayed) Labs Reviewed  WET PREP, GENITAL - Abnormal; Notable for the following components:      Result Value   Yeast Wet Prep HPF POC PRESENT (*)    All other components within normal limits  CBC WITH DIFFERENTIAL/PLATELET - Abnormal; Notable for the following components:   WBC 13.6 (*)    RBC 5.81 (*)    MCV 66.4 (*)    MCH 21.3 (*)    RDW 18.5 (*)    Neutro Abs 10.1 (*)    All other components within normal limits  COMPREHENSIVE METABOLIC PANEL WITH GFR - Abnormal; Notable for the following components:   Glucose, Bld 110 (*)    All other components  within normal limits  URINALYSIS, W/ REFLEX TO CULTURE (INFECTION SUSPECTED) - Abnormal; Notable for the following components:   APPearance HAZY (*)    All other components within normal limits  HCG, SERUM, QUALITATIVE  HIV ANTIBODY (ROUTINE TESTING W REFLEX)  GC/CHLAMYDIA PROBE AMP ([place]) NOT AT [facility]  TROPONIN I (HIGH SENSITIVITY)  TROPONIN I (HIGH SENSITIVITY)    EKG: EKG Interpretation Date/Time:  Sunday November 07 2023 08:24:37 EDT Ventricular Rate:  66 PR Interval:  150 QRS Duration:  81  QT Interval:  426 QTC Calculation: 447 R Axis:   40  Text Interpretation: Sinus rhythm Since prior ECG, tw inversions in III are new, no other significant changes Confirmed by Dreama Longs ([id number]) on 11/07/2023 8:33:16 AM  Radiology: [facility] Chest 2 View Result Date: 11/07/2023 CLINICAL DATA:  Chest pain EXAM: CHEST - 2 VIEW COMPARISON:  Chest radiograph dated 06/10/2023 FINDINGS: Normal lung volumes. No focal consolidations. No pleural effusion or pneumothorax. The heart size and mediastinal contours are within normal limits. No acute osseous abnormality. IMPRESSION: No active cardiopulmonary disease. Electronically Signed   By: Limin  Xu M.D.   On: 11/07/2023 08:17     Procedures   Medications Ordered in the ED - No data to display                                   43 year old female with a history of hypertension presents with concern for 2 months of vaginal discharge, chest pain beginning yesterday.  Regarding her vaginal discharge and burning--- do not feel a pelvic exam is consistent with pelvic inflammatory disease, TOA, torsion.  Her pregnancy test is negative.  Her wet prep was positive for yeast.  Suspect this is likely etiology of her symptoms.  The over-the-counter Monistat she is last night has been irritating, and will use oral medication for treatment.  Regarding her chest pain-- Differential diagnosis for chest pain includes pulmonary embolus, dissection, pneumothorax,  pneumonia, ACS, myocarditis, pericarditis.    EKG was done and evaluate by me and showed no acute ST changes and no signs of pericarditis.   Chest x-ray was done and evaluated by me and radiology and showed no sign of pneumonia or pneumothorax.   She has no shortness of breath, no asymmetric leg swelling, no hypoxia, no tachycardia, low clinical suspicion for PE by history and exam.  Low clinical suspicion for aortic dissection by history and exam.  Labs completed and personally evaluated and interpreted by me show mild leukocytosis, no anemia, no clinically significant electrolyte abnormalities.  Initial troponin is negative, second troponin negative. Low suspicion for ACS at this time.    Given her family history of early coronary artery disease and presence of chest pain, will refer to cardiology for further evaluation.  WIll trial PPI.  Given rx for fluconazole, recommend pcp or GYN follow up. Recommend Cardiology follow up for chest pain given family hx. Recommend PCP follow up for symptoms, lesion on foot suspect ganglion cyst.  Patient discharged in stable condition with understanding of reasons to return.       Final diagnoses:  Chest pain, unspecified type  Yeast infection    ED Discharge Orders          Ordered    fluconazole (DIFLUCAN) 150 MG tablet  every 72 hours        11/07/23 0939    Ambulatory referral to Cardiology        11/07/23 1353    pantoprazole (PROTONIX) 20 MG tablet  Daily        11/07/23 1357               Dreama Longs, MD 11/07/23 2252

## 2023-11-08 ENCOUNTER — Other Ambulatory Visit (HOSPITAL_COMMUNITY): Payer: Self-pay | Admitting: Physician Assistant

## 2023-11-08 ENCOUNTER — Other Ambulatory Visit (HOSPITAL_COMMUNITY): Payer: Self-pay

## 2023-11-08 DIAGNOSIS — F902 Attention-deficit hyperactivity disorder, combined type: Secondary | ICD-10-CM

## 2023-11-08 LAB — GC/CHLAMYDIA PROBE AMP (~~LOC~~) NOT AT ARMC
Chlamydia: NEGATIVE
Comment: NEGATIVE
Comment: NORMAL
Neisseria Gonorrhea: NEGATIVE

## 2023-11-10 ENCOUNTER — Other Ambulatory Visit (HOSPITAL_COMMUNITY): Payer: Self-pay | Admitting: Physician Assistant

## 2023-11-10 ENCOUNTER — Other Ambulatory Visit: Payer: Self-pay

## 2023-11-10 ENCOUNTER — Other Ambulatory Visit (HOSPITAL_COMMUNITY): Payer: Self-pay

## 2023-11-10 DIAGNOSIS — F902 Attention-deficit hyperactivity disorder, combined type: Secondary | ICD-10-CM

## 2023-11-10 MED ORDER — ATOMOXETINE HCL 60 MG PO CAPS
60.0000 mg | ORAL_CAPSULE | Freq: Every day | ORAL | 0 refills | Status: DC
  Filled 2023-11-10: qty 90, 90d supply, fill #0

## 2023-11-23 ENCOUNTER — Telehealth (HOSPITAL_COMMUNITY): Admitting: Physician Assistant

## 2023-11-23 ENCOUNTER — Encounter (HOSPITAL_COMMUNITY): Payer: Self-pay | Admitting: Physician Assistant

## 2023-11-23 DIAGNOSIS — F902 Attention-deficit hyperactivity disorder, combined type: Secondary | ICD-10-CM | POA: Diagnosis not present

## 2023-11-23 DIAGNOSIS — F4310 Post-traumatic stress disorder, unspecified: Secondary | ICD-10-CM

## 2023-11-23 DIAGNOSIS — F411 Generalized anxiety disorder: Secondary | ICD-10-CM | POA: Diagnosis not present

## 2023-11-23 DIAGNOSIS — F331 Major depressive disorder, recurrent, moderate: Secondary | ICD-10-CM | POA: Diagnosis not present

## 2023-11-23 MED ORDER — SERTRALINE HCL 100 MG PO TABS: 200.0000 mg | ORAL_TABLET | Freq: Every day | ORAL | 1 refills | Status: DC

## 2023-11-23 MED ORDER — ATOMOXETINE HCL 60 MG PO CAPS: 60.0000 mg | ORAL_CAPSULE | Freq: Every day | ORAL | 1 refills | Status: DC

## 2023-11-23 MED ORDER — HYDROXYZINE HCL 25 MG PO TABS: 25.0000 mg | ORAL_TABLET | Freq: Every evening | ORAL | 1 refills | Status: DC | PRN
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
## 2024-01-06 ENCOUNTER — Telehealth (HOSPITAL_COMMUNITY): Admitting: Physician Assistant

## 2024-01-06 ENCOUNTER — Encounter (HOSPITAL_COMMUNITY): Payer: Self-pay | Admitting: Physician Assistant

## 2024-01-06 DIAGNOSIS — F411 Generalized anxiety disorder: Secondary | ICD-10-CM | POA: Diagnosis not present

## 2024-01-06 DIAGNOSIS — F902 Attention-deficit hyperactivity disorder, combined type: Secondary | ICD-10-CM | POA: Diagnosis not present

## 2024-01-06 DIAGNOSIS — F331 Major depressive disorder, recurrent, moderate: Secondary | ICD-10-CM

## 2024-01-06 DIAGNOSIS — F4310 Post-traumatic stress disorder, unspecified: Secondary | ICD-10-CM | POA: Diagnosis not present

## 2024-01-06 MED ORDER — SERTRALINE HCL 100 MG PO TABS: 200.0000 mg | ORAL_TABLET | Freq: Every day | ORAL | 1 refills | Status: DC

## 2024-01-06 MED ORDER — ATOMOXETINE HCL 60 MG PO CAPS: 60.0000 mg | ORAL_CAPSULE | Freq: Every day | ORAL | 1 refills | Status: DC

## 2024-01-06 MED ORDER — HYDROXYZINE HCL 25 MG PO TABS: 25.0000 mg | ORAL_TABLET | Freq: Every evening | ORAL | 1 refills | Status: DC | PRN
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
## 2024-02-17 ENCOUNTER — Telehealth (HOSPITAL_COMMUNITY): Admitting: Physician Assistant

## 2024-02-17 DIAGNOSIS — F4310 Post-traumatic stress disorder, unspecified: Secondary | ICD-10-CM

## 2024-02-17 DIAGNOSIS — F411 Generalized anxiety disorder: Secondary | ICD-10-CM

## 2024-02-17 DIAGNOSIS — F902 Attention-deficit hyperactivity disorder, combined type: Secondary | ICD-10-CM

## 2024-02-17 DIAGNOSIS — F331 Major depressive disorder, recurrent, moderate: Secondary | ICD-10-CM | POA: Diagnosis not present

## 2024-02-20 ENCOUNTER — Encounter (HOSPITAL_COMMUNITY): Payer: Self-pay | Admitting: Physician Assistant

## 2024-02-20 MED ORDER — HYDROXYZINE HCL 25 MG PO TABS: 25.0000 mg | ORAL_TABLET | Freq: Every evening | ORAL | 1 refills | Status: DC | PRN

## 2024-02-20 MED ORDER — ATOMOXETINE HCL 60 MG PO CAPS: 60.0000 mg | ORAL_CAPSULE | Freq: Every day | ORAL | 2 refills | Status: DC

## 2024-02-20 MED ORDER — SERTRALINE HCL 100 MG PO TABS: 200.0000 mg | ORAL_TABLET | Freq: Every day | ORAL | 2 refills | Status: DC
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
## 2024-04-06 ENCOUNTER — Telehealth (INDEPENDENT_AMBULATORY_CARE_PROVIDER_SITE_OTHER): Admitting: Physician Assistant

## 2024-04-06 ENCOUNTER — Encounter (HOSPITAL_COMMUNITY): Payer: Self-pay | Admitting: Physician Assistant

## 2024-04-06 DIAGNOSIS — F411 Generalized anxiety disorder: Secondary | ICD-10-CM

## 2024-04-06 DIAGNOSIS — F331 Major depressive disorder, recurrent, moderate: Secondary | ICD-10-CM | POA: Diagnosis not present

## 2024-04-06 DIAGNOSIS — F902 Attention-deficit hyperactivity disorder, combined type: Secondary | ICD-10-CM | POA: Diagnosis not present

## 2024-04-06 DIAGNOSIS — F4310 Post-traumatic stress disorder, unspecified: Secondary | ICD-10-CM | POA: Diagnosis not present

## 2024-04-06 MED ORDER — ATOMOXETINE HCL 60 MG PO CAPS: 60.0000 mg | ORAL_CAPSULE | Freq: Every day | ORAL | 2 refills | Status: DC

## 2024-04-06 MED ORDER — SERTRALINE HCL 100 MG PO TABS: 200.0000 mg | ORAL_TABLET | Freq: Every day | ORAL | 2 refills | Status: DC

## 2024-04-06 MED ORDER — HYDROXYZINE HCL 50 MG PO TABS: 50.0000 mg | ORAL_TABLET | Freq: Every evening | ORAL | 2 refills | Status: DC | PRN
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
## 2024-05-23 ENCOUNTER — Encounter (HOSPITAL_COMMUNITY): Payer: Self-pay

## 2024-05-23 ENCOUNTER — Telehealth (HOSPITAL_COMMUNITY): Admitting: Physician Assistant

## 2024-05-23 DIAGNOSIS — F4310 Post-traumatic stress disorder, unspecified: Secondary | ICD-10-CM | POA: Diagnosis not present

## 2024-05-23 DIAGNOSIS — F411 Generalized anxiety disorder: Secondary | ICD-10-CM | POA: Diagnosis not present

## 2024-05-23 DIAGNOSIS — F902 Attention-deficit hyperactivity disorder, combined type: Secondary | ICD-10-CM | POA: Diagnosis not present

## 2024-05-23 DIAGNOSIS — F331 Major depressive disorder, recurrent, moderate: Secondary | ICD-10-CM

## 2024-05-25 ENCOUNTER — Telehealth (HOSPITAL_COMMUNITY): Admitting: Physician Assistant

## 2024-05-27 MED ORDER — HYDROXYZINE HCL 50 MG PO TABS: 50.0000 mg | ORAL_TABLET | Freq: Every evening | ORAL | 2 refills | Status: AC | PRN

## 2024-05-27 MED ORDER — ATOMOXETINE HCL 60 MG PO CAPS: 60.0000 mg | ORAL_CAPSULE | Freq: Every day | ORAL | 2 refills | Status: AC

## 2024-05-27 MED ORDER — SERTRALINE HCL 100 MG PO TABS: 200.0000 mg | ORAL_TABLET | Freq: Every day | ORAL | 2 refills | Status: AC

## 2024-06-16 ENCOUNTER — Encounter (HOSPITAL_COMMUNITY): Payer: Self-pay | Admitting: Physician Assistant

## 2024-07-04 ENCOUNTER — Telehealth (HOSPITAL_COMMUNITY): Admitting: Physician Assistant
